# Patient Record
Sex: Female | Born: 1955 | Race: White | Hispanic: No | Marital: Married | Smoking: Current every day smoker
Health system: Southern US, Community
[De-identification: ages and names within clinical notes are randomized; demographics above are authoritative.]

## PROBLEM LIST (undated history)

## (undated) DIAGNOSIS — K449 Diaphragmatic hernia without obstruction or gangrene: Secondary | ICD-10-CM

## (undated) DIAGNOSIS — E079 Disorder of thyroid, unspecified: Secondary | ICD-10-CM

## (undated) DIAGNOSIS — F329 Major depressive disorder, single episode, unspecified: Secondary | ICD-10-CM

## (undated) DIAGNOSIS — F32A Depression, unspecified: Secondary | ICD-10-CM

## (undated) DIAGNOSIS — M1990 Unspecified osteoarthritis, unspecified site: Secondary | ICD-10-CM

## (undated) DIAGNOSIS — F419 Anxiety disorder, unspecified: Secondary | ICD-10-CM

## (undated) DIAGNOSIS — E785 Hyperlipidemia, unspecified: Secondary | ICD-10-CM

## (undated) DIAGNOSIS — E119 Type 2 diabetes mellitus without complications: Secondary | ICD-10-CM

## (undated) DIAGNOSIS — I10 Essential (primary) hypertension: Secondary | ICD-10-CM

## (undated) HISTORY — DX: Hyperlipidemia, unspecified: E78.5

## (undated) HISTORY — DX: Major depressive disorder, single episode, unspecified: F32.9

## (undated) HISTORY — DX: Anxiety disorder, unspecified: F41.9

## (undated) HISTORY — PX: KNEE ARTHROSCOPY W/ MENISCAL TRANSPLANT: SHX1878

## (undated) HISTORY — DX: Disorder of thyroid, unspecified: E07.9

## (undated) HISTORY — PX: CARPAL TUNNEL RELEASE: SHX101

## (undated) HISTORY — DX: Depression, unspecified: F32.A

## (undated) HISTORY — DX: Type 2 diabetes mellitus without complications: E11.9

---

## 1985-04-26 HISTORY — PX: TUBAL LIGATION: SHX77

## 1997-04-26 HISTORY — PX: HAND SURGERY: SHX662

## 2005-08-24 ENCOUNTER — Ambulatory Visit: Payer: Self-pay | Admitting: Family Medicine

## 2005-10-15 ENCOUNTER — Ambulatory Visit: Payer: Self-pay | Admitting: Gastroenterology

## 2006-08-16 ENCOUNTER — Ambulatory Visit: Payer: Self-pay | Admitting: Internal Medicine

## 2006-08-25 ENCOUNTER — Ambulatory Visit: Payer: Self-pay | Admitting: Internal Medicine

## 2006-09-25 ENCOUNTER — Ambulatory Visit: Payer: Self-pay | Admitting: Internal Medicine

## 2006-10-25 ENCOUNTER — Ambulatory Visit: Payer: Self-pay | Admitting: Internal Medicine

## 2007-04-17 ENCOUNTER — Ambulatory Visit: Payer: Self-pay | Admitting: Urology

## 2007-09-07 ENCOUNTER — Ambulatory Visit: Payer: Self-pay | Admitting: Urology

## 2008-02-01 ENCOUNTER — Ambulatory Visit: Payer: Self-pay | Admitting: Gastroenterology

## 2008-04-04 ENCOUNTER — Ambulatory Visit: Payer: Self-pay

## 2008-04-26 HISTORY — PX: KNEE SURGERY: SHX244

## 2008-06-06 ENCOUNTER — Ambulatory Visit: Payer: Self-pay | Admitting: General Practice

## 2008-06-07 ENCOUNTER — Ambulatory Visit: Payer: Self-pay | Admitting: General Practice

## 2008-07-12 ENCOUNTER — Ambulatory Visit: Payer: Self-pay | Admitting: Urology

## 2009-08-28 ENCOUNTER — Ambulatory Visit: Payer: Self-pay | Admitting: Urology

## 2011-04-13 ENCOUNTER — Ambulatory Visit: Payer: Self-pay | Admitting: Surgery

## 2011-06-16 ENCOUNTER — Ambulatory Visit: Payer: Self-pay | Admitting: Gastroenterology

## 2011-06-16 LAB — HM COLONOSCOPY

## 2011-06-19 LAB — PATHOLOGY REPORT

## 2011-09-10 ENCOUNTER — Ambulatory Visit: Payer: Self-pay | Admitting: Urology

## 2011-09-10 LAB — CREATININE, SERUM
Creatinine: 0.98 mg/dL (ref 0.60–1.30)
EGFR (African American): >60
EGFR (Non-African Amer.): >60

## 2012-03-31 LAB — HM PAP SMEAR: HM PAP: NEGATIVE

## 2014-05-10 LAB — LIPID PANEL
CHOLESTEROL: 160 mg/dL (ref 0–200)
HDL: 39 mg/dL (ref 35–70)
LDL CALC: 84 mg/dL
TRIGLYCERIDES: 183 mg/dL — AB (ref 40–160)

## 2014-05-10 LAB — BASIC METABOLIC PANEL
BUN: 21 mg/dL (ref 4–21)
CREATININE: 0.8 mg/dL (ref 0.5–1.1)
Glucose: 108 mg/dL
Potassium: 4.4 mmol/L (ref 3.4–5.3)
SODIUM: 142 mmol/L (ref 137–147)

## 2014-05-10 LAB — TSH: TSH: 2.29 u[IU]/mL (ref 0.41–5.90)

## 2014-05-10 LAB — CBC AND DIFFERENTIAL
HEMATOCRIT: 46 % (ref 36–46)
Hemoglobin: 15.8 g/dL (ref 12.0–16.0)
PLATELETS: 155 10*3/uL (ref 150–399)
WBC: 7.3 10^3/mL

## 2014-05-10 LAB — HM MAMMOGRAPHY

## 2014-05-10 LAB — HEPATIC FUNCTION PANEL
ALT: 16 U/L (ref 7–35)
AST: 19 U/L (ref 13–35)

## 2014-05-10 LAB — HEMOGLOBIN A1C: Hgb A1c MFr Bld: 6 % (ref 4.0–6.0)

## 2014-09-27 ENCOUNTER — Other Ambulatory Visit: Payer: Self-pay | Admitting: Family Medicine

## 2014-09-27 DIAGNOSIS — I10 Essential (primary) hypertension: Secondary | ICD-10-CM

## 2014-11-05 ENCOUNTER — Other Ambulatory Visit: Payer: Self-pay

## 2014-11-05 DIAGNOSIS — F329 Major depressive disorder, single episode, unspecified: Secondary | ICD-10-CM | POA: Insufficient documentation

## 2014-11-05 DIAGNOSIS — F32A Depression, unspecified: Secondary | ICD-10-CM

## 2014-11-05 DIAGNOSIS — E785 Hyperlipidemia, unspecified: Secondary | ICD-10-CM

## 2014-11-05 MED ORDER — SIMVASTATIN 10 MG PO TABS: 10.0000 mg | ORAL_TABLET | Freq: Every day | ORAL | Status: DC

## 2014-11-05 MED ORDER — VENLAFAXINE HCL ER 150 MG PO TB24: 1.0000 | ORAL_TABLET | Freq: Every day | ORAL | Status: DC

## 2015-01-13 ENCOUNTER — Other Ambulatory Visit: Payer: Self-pay

## 2015-01-13 DIAGNOSIS — F419 Anxiety disorder, unspecified: Secondary | ICD-10-CM | POA: Insufficient documentation

## 2015-01-13 DIAGNOSIS — I152 Hypertension secondary to endocrine disorders: Secondary | ICD-10-CM | POA: Insufficient documentation

## 2015-01-13 DIAGNOSIS — E039 Hypothyroidism, unspecified: Secondary | ICD-10-CM | POA: Insufficient documentation

## 2015-01-13 DIAGNOSIS — E1159 Type 2 diabetes mellitus with other circulatory complications: Secondary | ICD-10-CM | POA: Insufficient documentation

## 2015-01-13 DIAGNOSIS — I10 Essential (primary) hypertension: Secondary | ICD-10-CM

## 2015-01-13 MED ORDER — METOPROLOL SUCCINATE ER 25 MG PO TB24: 25.0000 mg | ORAL_TABLET | Freq: Every day | ORAL | Status: DC

## 2015-01-13 NOTE — Telephone Encounter (Signed)
Last OV 04/2014  Thanks,   -Laura  

## 2015-01-14 ENCOUNTER — Ambulatory Visit (INDEPENDENT_AMBULATORY_CARE_PROVIDER_SITE_OTHER): Payer: 59 | Admitting: Family Medicine

## 2015-01-14 ENCOUNTER — Encounter: Payer: Self-pay | Admitting: Family Medicine

## 2015-01-14 VITALS — BP 122/76 | HR 90 | Temp 101.6°F | Resp 16 | Wt 211.8 lb

## 2015-01-14 DIAGNOSIS — F32A Depression, unspecified: Secondary | ICD-10-CM | POA: Insufficient documentation

## 2015-01-14 DIAGNOSIS — E1169 Type 2 diabetes mellitus with other specified complication: Secondary | ICD-10-CM | POA: Insufficient documentation

## 2015-01-14 DIAGNOSIS — N83209 Unspecified ovarian cyst, unspecified side: Secondary | ICD-10-CM | POA: Insufficient documentation

## 2015-01-14 DIAGNOSIS — B349 Viral infection, unspecified: Secondary | ICD-10-CM | POA: Diagnosis not present

## 2015-01-14 DIAGNOSIS — K635 Polyp of colon: Secondary | ICD-10-CM | POA: Insufficient documentation

## 2015-01-14 DIAGNOSIS — E785 Hyperlipidemia, unspecified: Secondary | ICD-10-CM | POA: Insufficient documentation

## 2015-01-14 DIAGNOSIS — K449 Diaphragmatic hernia without obstruction or gangrene: Secondary | ICD-10-CM | POA: Insufficient documentation

## 2015-01-14 DIAGNOSIS — F329 Major depressive disorder, single episode, unspecified: Secondary | ICD-10-CM | POA: Insufficient documentation

## 2015-01-14 DIAGNOSIS — D4410 Neoplasm of uncertain behavior of unspecified adrenal gland: Secondary | ICD-10-CM | POA: Insufficient documentation

## 2015-01-14 LAB — POCT INFLUENZA A/B
Influenza A, POC: NEGATIVE
Influenza B, POC: NEGATIVE

## 2015-01-14 MED ORDER — OSELTAMIVIR PHOSPHATE 75 MG PO CAPS: 75.0000 mg | ORAL_CAPSULE | Freq: Two times a day (BID) | ORAL | Status: DC

## 2015-01-14 NOTE — Patient Instructions (Signed)
Continue fluid intake and add Gatorade. Resume eating if possible. May use Delsym for cough

## 2015-01-14 NOTE — Progress Notes (Signed)
Subjective:     Patient ID: Regina Pierce, female   DOB: Jun 18, 1955, 59 y.o.   MRN: [id number]  HPI  Chief Complaint  Patient presents with  . Fever    Patient comes in office today with flu like symptoms since saturday 9/17. Patient has complaints of faigue, body ache, chills, watery stools, cough, vomiting and a fever high of 99.8. Patient reports taking ots [place] HBP  States she felt fatigued and was sleeping a lot on 9/17-18.Then developed dry heaves and loose stools a couple of times a day. Now has developed cough in the absence of sore throat and sinus congestion. Has been drinking water but has not had much to eat. States she is exposed to college age adults.   Review of Systems     Objective:   Physical Exam  Constitutional: She appears well-developed and well-nourished. She has a sickly appearance.  Abdominal: Soft. There is no tenderness.  Increased bowel sounds  Ears: T.M's intact without inflammatio Throat: no tonsillar enlargement or exudate Neck: no cervical adenopathy Lungs: clear     Assessment:    1. Viral syndrome: will treat empirically for influenza - POCT Influenza A/B - oseltamivir (TAMIFLU) 75 MG capsule; Take 1 capsule (75 mg total) by mouth 2 (two) times daily.  Dispense: 10 capsule; Refill: 0    Plan:    Return in 2 days if not improving. Work excuse provided for 9/19-23.

## 2015-01-20 ENCOUNTER — Ambulatory Visit
Admission: RE | Admit: 2015-01-20 | Discharge: 2015-01-20 | Disposition: A | Discharge status: Home / Self Care | Payer: 59 | Source: Ambulatory Visit | Attending: Family Medicine | Admitting: Family Medicine

## 2015-01-20 ENCOUNTER — Ambulatory Visit (INDEPENDENT_AMBULATORY_CARE_PROVIDER_SITE_OTHER): Payer: 59 | Admitting: Family Medicine

## 2015-01-20 ENCOUNTER — Encounter: Payer: Self-pay | Admitting: Family Medicine

## 2015-01-20 VITALS — BP 132/68 | HR 64 | Temp 98.6°F | Resp 16 | Wt 210.0 lb

## 2015-01-20 DIAGNOSIS — R05 Cough: Secondary | ICD-10-CM

## 2015-01-20 DIAGNOSIS — R059 Cough, unspecified: Secondary | ICD-10-CM

## 2015-01-20 MED ORDER — LEVALBUTEROL HCL 1.25 MG/0.5ML IN NEBU: 1.2500 mg | INHALATION_SOLUTION | RESPIRATORY_TRACT | Status: AC

## 2015-01-20 NOTE — Progress Notes (Signed)
Subjective:    Patient ID: Regina Pierce, female    DOB: [birth year], 59 y.o.   MRN: [id number]  URI  The current episode started 1 to 4 weeks ago. The problem has been unchanged. The maximum temperature recorded prior to her arrival was 101 - 101.9 F. Associated symptoms include congestion, coughing (mostly dry), diarrhea, rhinorrhea and wheezing. Pertinent negatives include no abdominal pain, chest pain, dysuria, ear pain, headaches, joint pain, joint swelling, nausea, neck pain, plugged ear sensation, rash, sinus pain, sneezing, sore throat, swollen glands or vomiting. Treatments tried: abx, Tamiflu, prescription cough syrup. The treatment provided no relief.  Pt saw Mikki Santee last Tuesday, and was prescribed Tamiflu, without relief. Pt went to [facility] yesterday morning, and was prescribed Doxy and Guaifenesin-Codeine 100-10 mg/78mL.    Review of Systems  Constitutional: Positive for appetite change.  HENT: Positive for congestion and rhinorrhea. Negative for ear pain, sneezing and sore throat.   Respiratory: Positive for cough (mostly dry) and wheezing.   Cardiovascular: Negative for chest pain.  Gastrointestinal: Positive for diarrhea. Negative for nausea, vomiting and abdominal pain.  Genitourinary: Negative for dysuria.  Musculoskeletal: Negative for joint pain and neck pain.  Skin: Negative for rash.  Neurological: Negative for headaches.   BP 132/68 mmHg  Pulse 64  Temp(Src) 98.6 F (37 C) (Oral)  Resp 16  Wt 210 lb (95.255 kg)  SpO2 95%   Patient Active Problem List   Diagnosis Date Noted  . Colon polyp 01/14/2015  . Clinical depression 01/14/2015  . Diabetes mellitus, type 2 01/14/2015  . 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 Hypercholesteremia 01/14/2015  . 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 Cyst of ovary 01/14/2015  . Anxiety 01/13/2015  . BP (high blood pressure) 01/13/2015  . Adult hypothyroidism 01/13/2015  . Hyperlipemia  11/05/2014  . Depression 11/05/2014   Past Medical History  Diagnosis Date  . Hyperlipidemia   . Diabetes mellitus without complication   . Thyroid disease   . Depression   . Anxiety    Current Outpatient Prescriptions on File Prior to Visit  Medication Sig  . aspirin 81 MG tablet Take by mouth.  . levothyroxine (SYNTHROID, LEVOTHROID) 150 MCG tablet Take by mouth.  Marland Kitchen lisinopril-hydrochlorothiazide (PRINZIDE,ZESTORETIC) 20-25 MG per tablet TAKE ONE TABLET BY MOUTH EVERY DAY  . LORazepam (ATIVAN) 1 MG tablet Take by mouth.  . metoprolol succinate (TOPROL XL) 25 MG 24 hr tablet Take 1 tablet (25 mg total) by mouth daily.  . simvastatin (ZOCOR) 10 MG tablet Take 1 tablet (10 mg total) by mouth at bedtime.  . Venlafaxine HCl 150 MG TB24 Take 1 tablet (150 mg total) by mouth daily.   No current facility-administered medications on file prior to visit.   No Known Allergies Past Surgical History  Procedure Laterality Date  . Knee surgery  2010  . Tubal ligation  1987  . Hand surgery Bilateral 1999    carpal tunnel    Social History   Social History  . Marital Status: Married    Spouse Name: N/A  . Number of Children: N/A  . Years of Education: N/A   Occupational History  . Not on file.   Social History Main Topics  . Smoking status: Current Some Day Smoker    Types: Cigarettes  . Smokeless tobacco: Never Used  . Alcohol Use: No  . Drug Use: No  . Sexual Activity: Not on file   Other Topics Concern  . Not  on file   Social History Narrative   Family History  Problem Relation Age of Onset  . Hypertension Sister       Objective:   Physical Exam  Constitutional: She is oriented to person, place, and time. She appears well-developed and well-nourished.  HENT:  Head: Normocephalic and atraumatic.  Right Ear: External ear normal.  Left Ear: External ear normal.  Mouth/Throat: Oropharynx is clear and moist.  Eyes: Conjunctivae and EOM are normal. Pupils are equal,  round, and reactive to light.  Neck: Normal range of motion. Neck supple.  Cardiovascular: Normal rate and regular rhythm.   Pulmonary/Chest: Effort normal. She has wheezes.  Neurological: She is alert and oriented to person, place, and time.  Psychiatric: She has a normal mood and affect. Her behavior is normal. Judgment and thought content normal.   BP 132/68 mmHg  Pulse 64  Temp(Src) 98.6 F (37 C) (Oral)  Resp 16  Wt 210 lb (95.255 kg)  SpO2 95%     Assessment & Plan:  1. Cough Not improved as much as patient had anticipated. Has been seen twice for same illness.   Has been on antibiotic for one day only.  Fever better, but very concerned about pneumonia Will check CXR. Continue current antibiotic. May consider respiratory quinolone if not improving. Also, recommend continue not smoking. Has not really quit but just feels too bad.   Patient instructed to call back if condition worsens or does not improve.   Did not improve with nebulizer at hoped.  Will hold off on inhaler or antibiotic for now.  Unclear if had flu or this is first presentation of COPD.  - DG Chest 2 View - levalbuterol (XOPENEX) nebulizer solution 1.25 mg; Take 1.25 mg by nebulization now.

## 2015-01-21 ENCOUNTER — Telehealth: Payer: Self-pay

## 2015-01-21 NOTE — Telephone Encounter (Signed)
-----   Message from Margarita Rana, MD sent at 01/20/2015  4:34 PM EDT ----- No pneumonia.  Did remark that there are bilateral spot that are probable nipple shadows. Recommend recheck CXR in 2 weeks and call if not improved. Thanks.

## 2015-01-21 NOTE — Telephone Encounter (Signed)
Pt advised as directed below.   Thanks,   -Laura  

## 2015-01-23 ENCOUNTER — Telehealth: Payer: Self-pay | Admitting: Family Medicine

## 2015-01-23 NOTE — Telephone Encounter (Signed)
Pt called wanting to talk to Mickel Baas about the cough she has.  She said she was diagnosed with pneumonia last week.  Please call back at   [phone]  Thanks Con Memos

## 2015-01-24 NOTE — Telephone Encounter (Signed)
I spoke with Ms. Dieujuste she reports feeling some better but still very tired and coughing some.  She is not sure if that is normal, I advised her to try OTC cough syrup to see if that helps with her fatigued.  I also told her to call tomorrow if she needs to be seen Saturday.   Thanks,   -Mickel Baas

## 2015-03-31 ENCOUNTER — Other Ambulatory Visit: Payer: Self-pay | Admitting: Family Medicine

## 2015-03-31 DIAGNOSIS — E039 Hypothyroidism, unspecified: Secondary | ICD-10-CM

## 2015-03-31 NOTE — Telephone Encounter (Signed)
Last TSH 05/10/2014 and was 2.29.

## 2015-05-13 ENCOUNTER — Ambulatory Visit (INDEPENDENT_AMBULATORY_CARE_PROVIDER_SITE_OTHER): Payer: BLUE CROSS/BLUE SHIELD | Admitting: Family Medicine

## 2015-05-13 ENCOUNTER — Encounter: Payer: Self-pay | Admitting: Family Medicine

## 2015-05-13 VITALS — BP 110/66 | HR 80 | Temp 97.6°F | Resp 16 | Ht 64.0 in | Wt 216.0 lb

## 2015-05-13 DIAGNOSIS — E039 Hypothyroidism, unspecified: Secondary | ICD-10-CM | POA: Diagnosis not present

## 2015-05-13 DIAGNOSIS — Z0000 Encounter for general adult medical examination without abnormal findings: Secondary | ICD-10-CM | POA: Diagnosis not present

## 2015-05-13 DIAGNOSIS — K629 Disease of anus and rectum, unspecified: Secondary | ICD-10-CM

## 2015-05-13 DIAGNOSIS — E119 Type 2 diabetes mellitus without complications: Secondary | ICD-10-CM | POA: Diagnosis not present

## 2015-05-13 DIAGNOSIS — E1122 Type 2 diabetes mellitus with diabetic chronic kidney disease: Secondary | ICD-10-CM | POA: Insufficient documentation

## 2015-05-13 DIAGNOSIS — Z1239 Encounter for other screening for malignant neoplasm of breast: Secondary | ICD-10-CM

## 2015-05-13 DIAGNOSIS — Z720 Tobacco use: Secondary | ICD-10-CM | POA: Insufficient documentation

## 2015-05-13 DIAGNOSIS — Z23 Encounter for immunization: Secondary | ICD-10-CM

## 2015-05-13 DIAGNOSIS — N6452 Nipple discharge: Secondary | ICD-10-CM | POA: Diagnosis not present

## 2015-05-13 DIAGNOSIS — E7800 Pure hypercholesterolemia, unspecified: Secondary | ICD-10-CM | POA: Diagnosis not present

## 2015-05-13 DIAGNOSIS — Z716 Tobacco abuse counseling: Secondary | ICD-10-CM

## 2015-05-13 DIAGNOSIS — I10 Essential (primary) hypertension: Secondary | ICD-10-CM

## 2015-05-13 DIAGNOSIS — R319 Hematuria, unspecified: Secondary | ICD-10-CM

## 2015-05-13 DIAGNOSIS — N182 Chronic kidney disease, stage 2 (mild): Secondary | ICD-10-CM | POA: Insufficient documentation

## 2015-05-13 LAB — POCT URINALYSIS DIPSTICK
Bilirubin, UA: NEGATIVE
Glucose, UA: NEGATIVE
Ketones, UA: NEGATIVE
LEUKOCYTES UA: NEGATIVE
Nitrite, UA: NEGATIVE
SPEC GRAV UA: 1.01
UROBILINOGEN UA: 0.2
pH, UA: 6.5

## 2015-05-13 NOTE — Patient Instructions (Signed)
Please use over the counter Debrox for a week.

## 2015-05-13 NOTE — Progress Notes (Signed)
Patient ID: Regina Pierce, female   DOB: Dec 12, 1955, 60 y.o.   MRN: [id number]       Patient: Regina Pierce, Female    DOB: Apr 14, 1956, 60 y.o.   MRN: [id number] Visit Date: 05/13/2015  Today's Provider: Margarita Rana, MD   Chief Complaint  Patient presents with  . Annual Exam   Subjective:    Annual physical exam Regina Pierce is a 60 y.o. female who presents today for health maintenance and complete physical. She feels well. She reports exercising daily (walking for 20 minutes). She reports she is sleeping well. 05/10/14 CPE 05/10/14 Mammo-BI-RADS 1 04/10/12 Pap-normal 06/16/11 Colon-polyps, recheck in 5 yrs  Lab Results  Component Value Date   WBC 7.3 05/10/2014   HGB 15.8 05/10/2014   HCT 46 05/10/2014   PLT 155 05/10/2014   CHOL 160 05/10/2014   TRIG 183* 05/10/2014   HDL 39 05/10/2014   LDLCALC 84 05/10/2014   ALT 16 05/10/2014   AST 19 05/10/2014   NA 142 05/10/2014   K 4.4 05/10/2014   CREATININE 0.8 05/10/2014   BUN 21 05/10/2014   TSH 2.29 05/10/2014   HGBA1C 6.0 05/10/2014   -----------------------------------------------------------------   Review of Systems  Constitutional: Negative.   HENT: Negative.   Eyes: Negative.   Respiratory: Negative.   Cardiovascular: Negative.   Gastrointestinal: Negative.   Endocrine: Negative.   Genitourinary: Negative.   Musculoskeletal: Negative.   Skin: Negative.   Allergic/Immunologic: Negative.   Neurological: Negative.   Hematological: Negative.   Psychiatric/Behavioral: Negative.     Social History      She  reports that she has been smoking Cigarettes.  She has never used smokeless tobacco. She reports that she does not drink alcohol or use illicit drugs.       Social History   Social History  . Marital Status: Married    Spouse Name: N/A  . Number of Children: N/A  . Years of Education: N/A   Social History Main Topics  . Smoking status: Current Some Day Smoker    Types: Cigarettes  . Smokeless  tobacco: Never Used  . Alcohol Use: No  . Drug Use: No  . Sexual Activity: Not Asked   Other Topics Concern  . None   Social History Narrative    Past Medical History  Diagnosis Date  . Hyperlipidemia   . Diabetes mellitus without complication ([place])   . Thyroid disease   . Depression   . Anxiety      Patient Active Problem List   Diagnosis Date Noted  . Diabetes mellitus without complication ([place]) [id number]  . Tobacco abuse 05/13/2015  . Colon polyp 01/14/2015  . Clinical depression 01/14/2015  . 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 Hypercholesteremia 01/14/2015  . 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 Cyst of ovary 01/14/2015  . Diaphragmatic hernia 01/14/2015  . Anxiety 01/13/2015  . BP (high blood pressure) 01/13/2015  . Adult hypothyroidism 01/13/2015  . Hyperlipemia 11/05/2014  . Depression 11/05/2014    Past Surgical History  Procedure Laterality Date  . Knee surgery  2010  . Tubal ligation  1987  . Hand surgery Bilateral 1999    carpal tunnel     Family History        Family Status  Relation Status Death Age  . Mother Deceased   . Father Deceased   . Sister Deceased 6    heart defect  . Brother Alive   . Sister Deceased  [employer] accident  . Sister Alive         Her family history includes Hypertension in her sister.    No Known Allergies  Previous Medications   ASPIRIN 81 MG TABLET    Take by mouth.   LEVOTHYROXINE (SYNTHROID, LEVOTHROID) 150 MCG TABLET    TAKE 1 TABLET BY MOUTH ONCE A DAY   LISINOPRIL-HYDROCHLOROTHIAZIDE (PRINZIDE,ZESTORETIC) 20-25 MG PER TABLET    TAKE ONE TABLET BY MOUTH EVERY DAY   LORAZEPAM (ATIVAN) 1 MG TABLET    Take 1 mg by mouth as needed.    METOPROLOL SUCCINATE (TOPROL XL) 25 MG 24 HR TABLET    Take 1 tablet (25 mg total) by mouth daily.   MULTIPLE VITAMIN (MULTI-VITAMINS) TABS    Take 1 tablet by mouth daily.   SIMVASTATIN (ZOCOR) 10 MG TABLET    Take 1 tablet (10 mg total) by mouth at  bedtime.   VENLAFAXINE HCL 150 MG TB24    Take 1 tablet (150 mg total) by mouth daily.   VITAMIN D, CHOLECALCIFEROL, PO    Take 1 tablet by mouth daily.    Patient Care Team: Margarita Rana, MD as PCP - General (Family Medicine)     Objective:   Vitals: BP 110/66 mmHg  Pulse 80  Temp(Src) 97.6 F (36.4 C) (Oral)  Resp 16  Ht 5\' 4"  (1.626 m)  Wt 216 lb (97.977 kg)  BMI 37.06 kg/m2   Physical Exam  Constitutional: She is oriented to person, place, and time. She appears well-developed and well-nourished.  HENT:  Head: Normocephalic and atraumatic.  Right Ear: Tympanic membrane, external ear and ear canal normal.  Left Ear: Tympanic membrane, external ear and ear canal normal.  Nose: Nose normal.  Mouth/Throat: Uvula is midline, oropharynx is clear and moist and mucous membranes are normal.  Eyes: Conjunctivae, EOM and lids are normal. Pupils are equal, round, and reactive to light.  Neck: Trachea normal and normal range of motion. Neck supple. Carotid bruit is not present. No thyroid mass and no thyromegaly present.  Cardiovascular: Normal rate, regular rhythm and normal heart sounds.   Pulmonary/Chest: Effort normal and breath sounds normal. Right breast exhibits skin change.  Crusting on the nipple  Abdominal: Soft. Normal appearance and bowel sounds are normal. There is no hepatosplenomegaly. There is no tenderness.  Genitourinary: Vagina normal. Rectal exam shows external hemorrhoid. No breast swelling, tenderness or discharge.  Musculoskeletal: Normal range of motion.  Lymphadenopathy:    She has no cervical adenopathy.    She has no axillary adenopathy.  Neurological: She is alert and oriented to person, place, and time. She has normal strength. No cranial nerve deficit.  Skin: Skin is warm, dry and intact.  Psychiatric: She has a normal mood and affect. Her speech is normal and behavior is normal. Judgment and thought content normal. Cognition and memory are normal.      Depression Screen PHQ 2/9 Scores 05/13/2015  Exception Documentation Patient refusal      Assessment & Plan:     Routine Health Maintenance and Physical Exam  Exercise Activities and Dietary recommendations Goals    . Exercise 150 minutes per week (moderate activity)       Immunization History  Administered Date(s) Administered  . Influenza,inj,Quad PF,36+ Mos 05/13/2015  . Tdap 12/07/2007    1. Annual physical exam As above.  - POCT urinalysis dipstick  2. Need for influenza vaccination Given today.  - Flu Vaccine QUAD 36+ mos IM  3. Essential  hypertension Condition is stable. Please continue current medication and  plan of care as noted.   - CBC with Differential/Platelet - Comprehensive metabolic panel  4. Hypothyroidism, unspecified hypothyroidism type Check labs.  - TSH  5. Diabetes mellitus without complication ([place]) Check labs.  - Hemoglobin A1c  6. Hypercholesteremia Stable. Check labs.  - Lipid Panel With LDL/HDL Ratio  7. Breast cancer screening Will schedule.  - MM Digital Diagnostic Bilat; Future  8. Tobacco abuse Counseled today. Will look into Nicotine patches and support thru her insurance.   9. Tobacco abuse counseling Counseled today.   10. Hematuria Will send today.  - Urine Microscopic  11. Nipple discharge Will schedule mammogram.   - Prolactin  12. Rectal lesion Will send for HPV.   - Cytology - Non PAP;  Patient was seen and examined by Jerrell Belfast, MD, and note scribed by Lynford Humphrey, [place].   I have reviewed the document for accuracy and completeness and I agree with above. Jerrell Belfast, MD   Margarita Rana, MD     --------------------------------------------------------------------

## 2015-05-14 ENCOUNTER — Telehealth: Payer: Self-pay

## 2015-05-14 LAB — COMPREHENSIVE METABOLIC PANEL
ALT: 22 IU/L (ref 0–32)
AST: 20 IU/L (ref 0–40)
Albumin/Globulin Ratio: 1.5 (ref 1.1–2.5)
Albumin: 4.1 g/dL (ref 3.5–5.5)
Alkaline Phosphatase: 67 IU/L (ref 39–117)
BUN/Creatinine Ratio: 24 — ABNORMAL HIGH (ref 9–23)
BUN: 16 mg/dL (ref 6–24)
Bilirubin Total: 0.7 mg/dL (ref 0.0–1.2)
CALCIUM: 9.5 mg/dL (ref 8.7–10.2)
CO2: 27 mmol/L (ref 18–29)
CREATININE: 0.67 mg/dL (ref 0.57–1.00)
Chloride: 99 mmol/L (ref 96–106)
GFR calc Af Amer: 113 mL/min/{1.73_m2} (ref >59)
GFR, EST NON AFRICAN AMERICAN: 98 mL/min/{1.73_m2} (ref >59)
GLOBULIN, TOTAL: 2.8 g/dL (ref 1.5–4.5)
Glucose: 115 mg/dL — ABNORMAL HIGH (ref 65–99)
Potassium: 4.1 mmol/L (ref 3.5–5.2)
SODIUM: 140 mmol/L (ref 134–144)
Total Protein: 6.9 g/dL (ref 6.0–8.5)

## 2015-05-14 LAB — LIPID PANEL WITH LDL/HDL RATIO
CHOLESTEROL TOTAL: 159 mg/dL (ref 100–199)
HDL: 35 mg/dL — AB (ref >39)
LDL Calculated: 89 mg/dL (ref 0–99)
LDl/HDL Ratio: 2.5 ratio units (ref 0.0–3.2)
Triglycerides: 175 mg/dL — ABNORMAL HIGH (ref 0–149)
VLDL CHOLESTEROL CAL: 35 mg/dL (ref 5–40)

## 2015-05-14 LAB — CBC WITH DIFFERENTIAL/PLATELET
Basophils Absolute: 0.1 10*3/uL (ref 0.0–0.2)
Basos: 1 %
EOS (ABSOLUTE): 0.2 10*3/uL (ref 0.0–0.4)
Eos: 2 %
Hematocrit: 43.3 % (ref 34.0–46.6)
Hemoglobin: 15.1 g/dL (ref 11.1–15.9)
Immature Grans (Abs): 0 10*3/uL (ref 0.0–0.1)
Immature Granulocytes: 0 %
Lymphocytes Absolute: 1.9 10*3/uL (ref 0.7–3.1)
Lymphs: 27 %
MCH: 31.1 pg (ref 26.6–33.0)
MCHC: 34.9 g/dL (ref 31.5–35.7)
MCV: 89 fL (ref 79–97)
Monocytes Absolute: 0.3 10*3/uL (ref 0.1–0.9)
Monocytes: 5 %
Neutrophils Absolute: 4.6 10*3/uL (ref 1.4–7.0)
Neutrophils: 65 %
Platelets: 160 10*3/uL (ref 150–379)
RBC: 4.86 x10E6/uL (ref 3.77–5.28)
RDW: 12.8 % (ref 12.3–15.4)
WBC: 7 10*3/uL (ref 3.4–10.8)

## 2015-05-14 LAB — PROLACTIN: PROLACTIN: 6.2 ng/mL (ref 4.8–23.3)

## 2015-05-14 LAB — TSH: TSH: 2.6 u[IU]/mL (ref 0.450–4.500)

## 2015-05-14 LAB — HEMOGLOBIN A1C
ESTIMATED AVERAGE GLUCOSE: 131 mg/dL
Hgb A1c MFr Bld: 6.2 % — ABNORMAL HIGH (ref 4.8–5.6)

## 2015-05-14 LAB — URINALYSIS, MICROSCOPIC ONLY: Casts: NONE SEEN /lpf

## 2015-05-14 NOTE — Telephone Encounter (Signed)
Left message to call back  

## 2015-05-14 NOTE — Telephone Encounter (Signed)
-----   Message from Margarita Rana, MD sent at 05/14/2015  9:59 AM EST ----- Labs stable except for sugar higher than previous. Eat healthy and exercise and recheck ov to recheck A1c in 4 months. Thanks.

## 2015-05-15 LAB — CYTOLOGY - NON PAP

## 2015-05-15 NOTE — Telephone Encounter (Signed)
Advised patient of results. Patient reports that she will call back to schedule appt.

## 2015-05-16 ENCOUNTER — Telehealth: Payer: Self-pay

## 2015-05-16 NOTE — Telephone Encounter (Signed)
-----   Message from Margarita Rana, MD sent at 05/16/2015 11:24 AM EST ----- No cancer cells. Please notify patient and have her to make sure and remind [place] next year to look at it again for stability.  Thanks.

## 2015-05-16 NOTE — Telephone Encounter (Signed)
LMTCB 05/16/2015  Thanks,   -Mickel Baas

## 2015-05-19 NOTE — Telephone Encounter (Signed)
Pt advised.   Thanks,   -Takeyah Wieman  

## 2015-08-01 ENCOUNTER — Other Ambulatory Visit: Payer: Self-pay | Admitting: Family Medicine

## 2015-08-01 DIAGNOSIS — I10 Essential (primary) hypertension: Secondary | ICD-10-CM

## 2015-09-19 ENCOUNTER — Other Ambulatory Visit: Payer: Self-pay | Admitting: Family Medicine

## 2015-09-19 DIAGNOSIS — E039 Hypothyroidism, unspecified: Secondary | ICD-10-CM

## 2015-09-19 DIAGNOSIS — E785 Hyperlipidemia, unspecified: Secondary | ICD-10-CM

## 2015-09-19 DIAGNOSIS — I10 Essential (primary) hypertension: Secondary | ICD-10-CM

## 2015-09-19 MED ORDER — LEVOTHYROXINE SODIUM 150 MCG PO TABS: 150.0000 ug | ORAL_TABLET | Freq: Every day | ORAL | Status: DC

## 2015-09-19 MED ORDER — METOPROLOL SUCCINATE ER 25 MG PO TB24: 25.0000 mg | ORAL_TABLET | Freq: Every day | ORAL | Status: DC

## 2015-09-19 MED ORDER — LISINOPRIL-HYDROCHLOROTHIAZIDE 20-25 MG PO TABS: 1.0000 | ORAL_TABLET | Freq: Every day | ORAL | Status: DC

## 2015-09-19 MED ORDER — SIMVASTATIN 10 MG PO TABS
10.0000 mg | ORAL_TABLET | Freq: Every day | ORAL | Status: DC
Start: 2015-09-19 — End: 2016-03-25

## 2015-09-19 NOTE — Telephone Encounter (Signed)
Pt is also requesting refill on simvastatin (ZOCOR) 10 MG tablet. Sent into [facility].90 day supply

## 2015-09-19 NOTE — Telephone Encounter (Signed)
Pt is requesting refills on levothyroxine (SYNTHROID, LEVOTHROID) 150 MCG,lisinopril-hydrochlorothiazide (PRINZIDE,ZESTORETIC) 20-25 MG tablet ,metoprolol succinate (TOPROL-XL) 25 MG 24 hr tablet .metoprolol succinate (TOPROL-XL) 25 MG 24 hr tablet. 90 day supply called into [facility]

## 2015-09-24 ENCOUNTER — Other Ambulatory Visit: Payer: Self-pay | Admitting: Family Medicine

## 2015-09-24 DIAGNOSIS — F419 Anxiety disorder, unspecified: Secondary | ICD-10-CM

## 2016-02-25 ENCOUNTER — Telehealth: Payer: Self-pay

## 2016-02-25 DIAGNOSIS — N3001 Acute cystitis with hematuria: Secondary | ICD-10-CM

## 2016-02-25 MED ORDER — SULFAMETHOXAZOLE-TRIMETHOPRIM 800-160 MG PO TABS: 1.0000 | ORAL_TABLET | Freq: Two times a day (BID) | ORAL | 0 refills | Status: DC

## 2016-02-25 NOTE — Telephone Encounter (Signed)
Left message for patient stating below.

## 2016-02-25 NOTE — Telephone Encounter (Signed)
Normally dont treat without appt, especially with no recurrent history. However, I will make an exception this time. Bactrim sent to [place]. If symptoms persist or worsen she needs to be seen. May also add AZO tabs or cranberry juice. Push fluids.

## 2016-02-25 NOTE — Telephone Encounter (Signed)
Patient called saying that she has had UTI symptoms since Monday (2 days ago). She reports that she is having urinary frequency, burning on urination, lower back pain, and she has some blood on the tissue after wiping. She reports that she has had a UTI in the past, but it has been "a while". Patient was requesting that her husband drop off a urine specimen, but I advised her that per our policy she would need an OV. Patient reports that she works long hours, and she is unable to get off of work. She is requesting that something be sent into the pharmacy for her symptoms. Patient uses [facility]. She was formerly a Dr. Venia Minks patient. Contact number is [phone]. Can leave a message at her home number as well. Thanks!

## 2016-03-25 ENCOUNTER — Other Ambulatory Visit: Payer: Self-pay | Admitting: Family Medicine

## 2016-03-25 DIAGNOSIS — E785 Hyperlipidemia, unspecified: Secondary | ICD-10-CM

## 2016-03-25 DIAGNOSIS — F419 Anxiety disorder, unspecified: Secondary | ICD-10-CM

## 2016-03-25 DIAGNOSIS — E039 Hypothyroidism, unspecified: Secondary | ICD-10-CM

## 2016-03-25 DIAGNOSIS — I10 Essential (primary) hypertension: Secondary | ICD-10-CM

## 2016-03-26 ENCOUNTER — Other Ambulatory Visit: Payer: Self-pay

## 2016-03-26 MED ORDER — LORAZEPAM 1 MG PO TABS: 1.0000 mg | ORAL_TABLET | ORAL | 1 refills | Status: DC | PRN

## 2016-03-26 NOTE — Telephone Encounter (Signed)
RX called in-aa 

## 2016-03-26 NOTE — Telephone Encounter (Signed)
Pharmacy requesting refills. Thanks!  

## 2016-03-26 NOTE — Telephone Encounter (Signed)
Please review-aa 

## 2016-05-03 ENCOUNTER — Encounter: Payer: Self-pay | Admitting: *Deleted

## 2016-05-04 ENCOUNTER — Encounter: Payer: Self-pay | Admitting: Student in an Organized Health Care Education/Training Program

## 2016-05-07 ENCOUNTER — Encounter
Admission: RE | Admit: 2016-05-07 | Discharge: 2016-05-07 | Disposition: A | Discharge status: Home / Self Care | Payer: 59 | Source: Ambulatory Visit | Attending: Podiatry | Admitting: Podiatry

## 2016-05-07 NOTE — Patient Instructions (Signed)
  Your procedure is scheduled on: 05-12-16 [facility]) Report to Same Day Surgery 2nd floor medical mall [facility] Entrance-take elevator on left to 2nd floor.  Check in with surgery information desk.) To find out your arrival time please call [phone] between 1PM - 3PM on 05-11-16 (TUESDAY)  Remember: Instructions that are not followed completely may result in serious medical risk, up to and including death, or upon the discretion of your surgeon and anesthesiologist your surgery may need to be rescheduled.    _x___ 1. Do not eat food or drink liquids after midnight. No gum chewing or hard candies.     __x__ 2. No Alcohol for 24 hours before or after surgery.   __x__3. No Smoking for 24 prior to surgery.   ____  4. Bring all medications with you on the day of surgery if instructed.    __x__ 5. Notify your doctor if there is any change in your medical condition     (cold, fever, infections).     Do not wear jewelry, make-up, hairpins, clips or nail polish.  Do not wear lotions, powders, or perfumes. You may wear deodorant.  Do not shave 48 hours prior to surgery. Men may shave face and neck.  Do not bring valuables to the hospital.    [facility] is not responsible for any belongings or valuables.               Contacts, dentures or bridgework may not be worn into surgery.  Leave your suitcase in the car. After surgery it may be brought to your room.  For patients admitted to the hospital, discharge time is determined by your treatment team.   Patients discharged the day of surgery will not be allowed to drive home.  You will need someone to drive you home and stay with you the night of your procedure.    Please read over the following fact sheets that you were given:   [facility] Preparing for Surgery and or MRSA Information   _x___ Take these medicines the morning of surgery with A SIP OF WATER:    1. METOPROLOL  2. LEVOTHYROXINE  3. EFFEXOR (VENLAFAXINE)  4. MAY TAKE  LORAZEPAM IF NEEDED  5.  6.  ____Fleets enema or Magnesium Citrate as directed.   _x___ Use CHG Soap or sage wipes as directed on instruction sheet   ____ Use inhalers on the day of surgery and bring to hospital day of surgery  ____ Stop metformin 2 days prior to surgery    ____ Take 1/2 of usual insulin dose the night before surgery and none on the morning of  surgery.   _X___ Stop Aspirin, Coumadin, Pllavix ,Eliquis, Effient, or Pradaxa-PT STOPPED ASPIRIN ON 05-06-16  x__ Stop Anti-inflammatories such as Advil, Aleve, Ibuprofen, Motrin, Naproxen,          Naprosyn, Goodies powders or aspirin products NOW- Ok to take Tylenol.   ____ Stop supplements until after surgery.    ____ Bring C-Pap to the hospital.

## 2016-05-10 ENCOUNTER — Encounter
Admission: RE | Admit: 2016-05-10 | Discharge: 2016-05-10 | Disposition: A | Discharge status: Home / Self Care | Payer: BLUE CROSS/BLUE SHIELD | Source: Ambulatory Visit | Attending: Podiatry | Admitting: Podiatry

## 2016-05-10 DIAGNOSIS — F329 Major depressive disorder, single episode, unspecified: Secondary | ICD-10-CM | POA: Diagnosis not present

## 2016-05-10 DIAGNOSIS — E669 Obesity, unspecified: Secondary | ICD-10-CM | POA: Diagnosis not present

## 2016-05-10 DIAGNOSIS — E039 Hypothyroidism, unspecified: Secondary | ICD-10-CM | POA: Diagnosis not present

## 2016-05-10 DIAGNOSIS — M7662 Achilles tendinitis, left leg: Secondary | ICD-10-CM | POA: Diagnosis not present

## 2016-05-10 DIAGNOSIS — K449 Diaphragmatic hernia without obstruction or gangrene: Secondary | ICD-10-CM | POA: Diagnosis not present

## 2016-05-10 DIAGNOSIS — E119 Type 2 diabetes mellitus without complications: Secondary | ICD-10-CM | POA: Diagnosis not present

## 2016-05-10 DIAGNOSIS — M898X7 Other specified disorders of bone, ankle and foot: Secondary | ICD-10-CM | POA: Diagnosis not present

## 2016-05-10 DIAGNOSIS — I10 Essential (primary) hypertension: Secondary | ICD-10-CM | POA: Diagnosis not present

## 2016-05-10 DIAGNOSIS — Z6837 Body mass index (BMI) 37.0-37.9, adult: Secondary | ICD-10-CM | POA: Diagnosis not present

## 2016-05-10 DIAGNOSIS — I452 Bifascicular block: Secondary | ICD-10-CM | POA: Diagnosis not present

## 2016-05-10 DIAGNOSIS — F419 Anxiety disorder, unspecified: Secondary | ICD-10-CM | POA: Diagnosis not present

## 2016-05-10 DIAGNOSIS — Z7982 Long term (current) use of aspirin: Secondary | ICD-10-CM | POA: Diagnosis not present

## 2016-05-10 DIAGNOSIS — F17200 Nicotine dependence, unspecified, uncomplicated: Secondary | ICD-10-CM | POA: Diagnosis not present

## 2016-05-10 LAB — BASIC METABOLIC PANEL
ANION GAP: 9 (ref 5–15)
BUN: 16 mg/dL (ref 6–20)
CALCIUM: 9.2 mg/dL (ref 8.9–10.3)
CO2: 26 mmol/L (ref 22–32)
Chloride: 103 mmol/L (ref 101–111)
Creatinine, Ser: 0.73 mg/dL (ref 0.44–1.00)
GLUCOSE: 134 mg/dL — AB (ref 65–99)
POTASSIUM: 3.8 mmol/L (ref 3.5–5.1)
Sodium: 138 mmol/L (ref 135–145)

## 2016-05-12 ENCOUNTER — Encounter
Admission: RE | Disposition: A | Discharge status: Home / Self Care | Payer: Self-pay | Source: Ambulatory Visit | Attending: Podiatry

## 2016-05-12 ENCOUNTER — Ambulatory Visit: Payer: BLUE CROSS/BLUE SHIELD | Admitting: Anesthesiology

## 2016-05-12 ENCOUNTER — Ambulatory Visit
Admission: RE | Admit: 2016-05-12 | Discharge: 2016-05-12 | Disposition: A | Discharge status: Home / Self Care | Payer: BLUE CROSS/BLUE SHIELD | Source: Ambulatory Visit | Attending: Podiatry | Admitting: Podiatry

## 2016-05-12 ENCOUNTER — Encounter: Payer: Self-pay | Admitting: Anesthesiology

## 2016-05-12 ENCOUNTER — Ambulatory Visit: Admit: 2016-05-12 | Payer: 59 | Admitting: Podiatry

## 2016-05-12 DIAGNOSIS — Z6837 Body mass index (BMI) 37.0-37.9, adult: Secondary | ICD-10-CM | POA: Insufficient documentation

## 2016-05-12 DIAGNOSIS — M898X7 Other specified disorders of bone, ankle and foot: Secondary | ICD-10-CM | POA: Diagnosis not present

## 2016-05-12 DIAGNOSIS — M7662 Achilles tendinitis, left leg: Secondary | ICD-10-CM | POA: Insufficient documentation

## 2016-05-12 DIAGNOSIS — F419 Anxiety disorder, unspecified: Secondary | ICD-10-CM | POA: Insufficient documentation

## 2016-05-12 DIAGNOSIS — E669 Obesity, unspecified: Secondary | ICD-10-CM | POA: Insufficient documentation

## 2016-05-12 DIAGNOSIS — F17200 Nicotine dependence, unspecified, uncomplicated: Secondary | ICD-10-CM | POA: Insufficient documentation

## 2016-05-12 DIAGNOSIS — I10 Essential (primary) hypertension: Secondary | ICD-10-CM | POA: Insufficient documentation

## 2016-05-12 DIAGNOSIS — Z7982 Long term (current) use of aspirin: Secondary | ICD-10-CM | POA: Insufficient documentation

## 2016-05-12 DIAGNOSIS — K449 Diaphragmatic hernia without obstruction or gangrene: Secondary | ICD-10-CM | POA: Insufficient documentation

## 2016-05-12 DIAGNOSIS — E119 Type 2 diabetes mellitus without complications: Secondary | ICD-10-CM | POA: Insufficient documentation

## 2016-05-12 DIAGNOSIS — F329 Major depressive disorder, single episode, unspecified: Secondary | ICD-10-CM | POA: Insufficient documentation

## 2016-05-12 DIAGNOSIS — E039 Hypothyroidism, unspecified: Secondary | ICD-10-CM | POA: Insufficient documentation

## 2016-05-12 DIAGNOSIS — I452 Bifascicular block: Secondary | ICD-10-CM | POA: Insufficient documentation

## 2016-05-12 HISTORY — PX: ACHILLES TENDON SURGERY: SHX542

## 2016-05-12 HISTORY — PX: BONE EXOSTOSIS EXCISION: SHX1249

## 2016-05-12 HISTORY — DX: Essential (primary) hypertension: I10

## 2016-05-12 LAB — GLUCOSE, CAPILLARY: Glucose-Capillary: 124 mg/dL — ABNORMAL HIGH (ref 65–99)

## 2016-05-12 SURGERY — EXCISION, EXOSTOSIS
Anesthesia: General | Laterality: Left

## 2016-05-12 SURGERY — REPAIR, TENDON, ACHILLES
Anesthesia: General | Site: Foot | Laterality: Left | Wound class: Clean

## 2016-05-12 MED ORDER — ONDANSETRON HCL 4 MG/2ML IJ SOLN: 4.0000 mg | Freq: Four times a day (QID) | INTRAMUSCULAR | Status: DC | PRN

## 2016-05-12 MED ORDER — BUPIVACAINE HCL (PF) 0.25 % IJ SOLN
INTRAMUSCULAR | Status: DC | PRN
  Administered 2016-05-12: 10 mL

## 2016-05-12 MED ORDER — ONDANSETRON HCL 4 MG/2ML IJ SOLN
INTRAMUSCULAR | Status: AC
  Filled 2016-05-12: qty 2

## 2016-05-12 MED ORDER — ONDANSETRON HCL 4 MG/2ML IJ SOLN: 4.0000 mg | Freq: Once | INTRAMUSCULAR | Status: DC | PRN

## 2016-05-12 MED ORDER — FENTANYL CITRATE (PF) 100 MCG/2ML IJ SOLN
INTRAMUSCULAR | Status: AC
  Filled 2016-05-12: qty 2

## 2016-05-12 MED ORDER — PROPOFOL 10 MG/ML IV BOLUS
INTRAVENOUS | Status: AC
  Filled 2016-05-12: qty 40

## 2016-05-12 MED ORDER — CEFAZOLIN SODIUM-DEXTROSE 2-4 GM/100ML-% IV SOLN
INTRAVENOUS | Status: AC
  Filled 2016-05-12: qty 100

## 2016-05-12 MED ORDER — MIDAZOLAM HCL 2 MG/2ML IJ SOLN
INTRAMUSCULAR | Status: AC
  Filled 2016-05-12: qty 2

## 2016-05-12 MED ORDER — SODIUM CHLORIDE 0.9 % IV SOLN
INTRAVENOUS | Status: DC
  Administered 2016-05-12: 12:00:00 via INTRAVENOUS

## 2016-05-12 MED ORDER — FENTANYL CITRATE (PF) 100 MCG/2ML IJ SOLN
INTRAMUSCULAR | Status: DC | PRN
  Administered 2016-05-12: 100 ug via INTRAVENOUS

## 2016-05-12 MED ORDER — FAMOTIDINE 20 MG PO TABS
20.0000 mg | ORAL_TABLET | Freq: Once | ORAL | Status: AC
Start: 2016-05-12 — End: 2016-05-12
  Administered 2016-05-12: 20 mg via ORAL

## 2016-05-12 MED ORDER — SEVOFLURANE IN SOLN
RESPIRATORY_TRACT | Status: AC
  Filled 2016-05-12: qty 250

## 2016-05-12 MED ORDER — ROCURONIUM BROMIDE 100 MG/10ML IV SOLN
INTRAVENOUS | Status: DC | PRN
  Administered 2016-05-12: 20 mg via INTRAVENOUS
  Administered 2016-05-12: 30 mg via INTRAVENOUS

## 2016-05-12 MED ORDER — KETOROLAC TROMETHAMINE 30 MG/ML IJ SOLN
INTRAMUSCULAR | Status: AC
  Filled 2016-05-12: qty 1

## 2016-05-12 MED ORDER — EPHEDRINE 5 MG/ML INJ
INTRAVENOUS | Status: AC
  Filled 2016-05-12: qty 10

## 2016-05-12 MED ORDER — DEXAMETHASONE SODIUM PHOSPHATE 10 MG/ML IJ SOLN
INTRAMUSCULAR | Status: DC | PRN
  Administered 2016-05-12: 5 mg via INTRAVENOUS

## 2016-05-12 MED ORDER — ONDANSETRON HCL 4 MG/2ML IJ SOLN
INTRAMUSCULAR | Status: DC | PRN
  Administered 2016-05-12: 4 mg via INTRAVENOUS

## 2016-05-12 MED ORDER — SUCCINYLCHOLINE CHLORIDE 200 MG/10ML IV SOSY
PREFILLED_SYRINGE | INTRAVENOUS | Status: AC
  Filled 2016-05-12: qty 10

## 2016-05-12 MED ORDER — MIDAZOLAM HCL 2 MG/2ML IJ SOLN
INTRAMUSCULAR | Status: DC | PRN
  Administered 2016-05-12: 2 mg via INTRAVENOUS

## 2016-05-12 MED ORDER — PHENYLEPHRINE HCL 10 MG/ML IJ SOLN
INTRAMUSCULAR | Status: DC | PRN
  Administered 2016-05-12: 80 ug via INTRAVENOUS
  Administered 2016-05-12 (×5): 40 ug via INTRAVENOUS
  Administered 2016-05-12: 80 ug via INTRAVENOUS
  Administered 2016-05-12 (×2): 40 ug via INTRAVENOUS
  Administered 2016-05-12: 100 ug via INTRAVENOUS

## 2016-05-12 MED ORDER — MIDAZOLAM HCL 2 MG/2ML IJ SOLN
2.0000 mg | Freq: Once | INTRAMUSCULAR | Status: AC
  Administered 2016-05-12: 2 mg via INTRAVENOUS

## 2016-05-12 MED ORDER — SUCCINYLCHOLINE CHLORIDE 20 MG/ML IJ SOLN
INTRAMUSCULAR | Status: DC | PRN
  Administered 2016-05-12: 180 mg via INTRAVENOUS

## 2016-05-12 MED ORDER — FAMOTIDINE 20 MG PO TABS
ORAL_TABLET | ORAL | Status: AC
  Administered 2016-05-12: 20 mg via ORAL
  Filled 2016-05-12: qty 1

## 2016-05-12 MED ORDER — LIDOCAINE-EPINEPHRINE 0.5 %-1:200000 IJ SOLN
INTRAMUSCULAR | Status: DC | PRN
  Administered 2016-05-12: 8 mL
  Administered 2016-05-12: 5 mL

## 2016-05-12 MED ORDER — LIDOCAINE-EPINEPHRINE 0.5 %-1:200000 IJ SOLN
INTRAMUSCULAR | Status: AC
  Filled 2016-05-12: qty 1

## 2016-05-12 MED ORDER — ROPIVACAINE HCL 5 MG/ML IJ SOLN
INTRAMUSCULAR | Status: DC | PRN
  Administered 2016-05-12: 30 mL via EPIDURAL

## 2016-05-12 MED ORDER — SUGAMMADEX SODIUM 200 MG/2ML IV SOLN
INTRAVENOUS | Status: DC | PRN
  Administered 2016-05-12: 200 mg via INTRAVENOUS

## 2016-05-12 MED ORDER — KETOROLAC TROMETHAMINE 30 MG/ML IJ SOLN
INTRAMUSCULAR | Status: DC | PRN
  Administered 2016-05-12: 30 mg via INTRAVENOUS

## 2016-05-12 MED ORDER — PROPOFOL 10 MG/ML IV BOLUS
INTRAVENOUS | Status: DC | PRN
  Administered 2016-05-12: 50 mg via INTRAVENOUS
  Administered 2016-05-12: 100 mg via INTRAVENOUS

## 2016-05-12 MED ORDER — OXYCODONE-ACETAMINOPHEN 5-325 MG PO TABS: 1.0000 | ORAL_TABLET | ORAL | 0 refills | Status: DC | PRN

## 2016-05-12 MED ORDER — PHENYLEPHRINE HCL 10 MG/ML IJ SOLN
INTRAMUSCULAR | Status: AC
  Filled 2016-05-12: qty 2

## 2016-05-12 MED ORDER — OXYCODONE-ACETAMINOPHEN 5-325 MG PO TABS: 1.0000 | ORAL_TABLET | ORAL | Status: DC | PRN

## 2016-05-12 MED ORDER — PHENYLEPHRINE 40 MCG/ML (10ML) SYRINGE FOR IV PUSH (FOR BLOOD PRESSURE SUPPORT)
PREFILLED_SYRINGE | INTRAVENOUS | Status: AC
  Filled 2016-05-12: qty 10

## 2016-05-12 MED ORDER — LIDOCAINE 2% (20 MG/ML) 5 ML SYRINGE
INTRAMUSCULAR | Status: AC
  Filled 2016-05-12: qty 5

## 2016-05-12 MED ORDER — ONDANSETRON HCL 4 MG PO TABS: 4.0000 mg | ORAL_TABLET | Freq: Four times a day (QID) | ORAL | Status: DC | PRN

## 2016-05-12 MED ORDER — LIDOCAINE HCL (CARDIAC) 20 MG/ML IV SOLN
INTRAVENOUS | Status: DC | PRN
  Administered 2016-05-12: 100 mg via INTRAVENOUS

## 2016-05-12 MED ORDER — CEFAZOLIN SODIUM-DEXTROSE 2-4 GM/100ML-% IV SOLN
2.0000 g | Freq: Once | INTRAVENOUS | Status: AC
  Administered 2016-05-12: 2 g via INTRAVENOUS

## 2016-05-12 MED ORDER — ROPIVACAINE HCL 5 MG/ML IJ SOLN
INTRAMUSCULAR | Status: AC
  Filled 2016-05-12: qty 20

## 2016-05-12 MED ORDER — LIDOCAINE HCL 2 % EX GEL
CUTANEOUS | Status: AC
  Filled 2016-05-12: qty 5

## 2016-05-12 MED ORDER — LACTATED RINGERS IV SOLN
INTRAVENOUS | Status: DC | PRN
  Administered 2016-05-12 (×2): via INTRAVENOUS

## 2016-05-12 MED ORDER — BUPIVACAINE HCL (PF) 0.25 % IJ SOLN
INTRAMUSCULAR | Status: AC
  Filled 2016-05-12: qty 30

## 2016-05-12 MED ORDER — EPHEDRINE SULFATE 50 MG/ML IJ SOLN
INTRAMUSCULAR | Status: DC | PRN
  Administered 2016-05-12: 10 mg via INTRAVENOUS

## 2016-05-12 MED ORDER — SUGAMMADEX SODIUM 200 MG/2ML IV SOLN
INTRAVENOUS | Status: AC
  Filled 2016-05-12: qty 2

## 2016-05-12 MED ORDER — ROCURONIUM BROMIDE 50 MG/5ML IV SOSY
PREFILLED_SYRINGE | INTRAVENOUS | Status: AC
  Filled 2016-05-12: qty 5

## 2016-05-12 MED ORDER — MIDAZOLAM HCL 2 MG/2ML IJ SOLN
INTRAMUSCULAR | Status: AC
  Administered 2016-05-12: 2 mg via INTRAVENOUS
  Filled 2016-05-12: qty 2

## 2016-05-12 MED ORDER — FENTANYL CITRATE (PF) 100 MCG/2ML IJ SOLN: 25.0000 ug | INTRAMUSCULAR | Status: DC | PRN

## 2016-05-12 SURGICAL SUPPLY — 71 items
ANCHOR SUT 5.5 SPEEDSCREW (Screw) ×3 IMPLANT
BANDAGE ELASTIC 4 LF NS (GAUZE/BANDAGES/DRESSINGS) IMPLANT
BANDAGE STRETCH 3X4.1 STRL (GAUZE/BANDAGES/DRESSINGS) IMPLANT
BLADE SURG 15 STRL LF DISP TIS (BLADE) ×6 IMPLANT
BLADE SURG 15 STRL SS (BLADE) ×3
BLADE SURG MINI STRL (BLADE) ×3 IMPLANT
BNDG COHESIVE 4X5 TAN STRL (GAUZE/BANDAGES/DRESSINGS) ×3 IMPLANT
BNDG ESMARK 4X12 TAN STRL LF (GAUZE/BANDAGES/DRESSINGS) IMPLANT
BNDG ESMARK 6X12 TAN STRL LF (GAUZE/BANDAGES/DRESSINGS) IMPLANT
BNDG GAUZE 4.5X4.1 6PLY STRL (MISCELLANEOUS) ×3 IMPLANT
CANISTER SUCT 1200ML W/VALVE (MISCELLANEOUS) ×3 IMPLANT
DECANTER SPIKE VIAL GLASS SM (MISCELLANEOUS) ×3 IMPLANT
DERMABOND ADVANCED (GAUZE/BANDAGES/DRESSINGS) ×1
DERMABOND ADVANCED .7 DNX12 (GAUZE/BANDAGES/DRESSINGS) ×2 IMPLANT
DRAPE EXTREMITY 106X87X128.5 (DRAPES) ×3 IMPLANT
DRAPE FLUOR MINI C-ARM 54X84 (DRAPES) ×3 IMPLANT
DRAPE IMP U-DRAPE 54X76 (DRAPES) ×3 IMPLANT
DRSG TEGADERM 4X4.75 (GAUZE/BANDAGES/DRESSINGS) ×3 IMPLANT
DURAPREP 26ML APPLICATOR (WOUND CARE) ×9 IMPLANT
ELECT REM PT RETURN 9FT ADLT (ELECTROSURGICAL) ×3
ELECTRODE REM PT RTRN 9FT ADLT (ELECTROSURGICAL) ×2 IMPLANT
GAUZE PETRO XEROFOAM 1X8 (MISCELLANEOUS) ×3 IMPLANT
GAUZE SPONGE 4X4 12PLY STRL (GAUZE/BANDAGES/DRESSINGS) ×3 IMPLANT
GAUZE STRETCH 2X75IN STRL (MISCELLANEOUS) IMPLANT
GLOVE BIO SURGEON STRL SZ7.5 (GLOVE) ×9 IMPLANT
GLOVE INDICATOR 8.0 STRL GRN (GLOVE) ×3 IMPLANT
GOWN STRL REUS W/ TWL LRG LVL3 (GOWN DISPOSABLE) ×6 IMPLANT
GOWN STRL REUS W/TWL LRG LVL3 (GOWN DISPOSABLE) ×3
HANDLE YANKAUER SUCT BULB TIP (MISCELLANEOUS) ×6 IMPLANT
KIT RM TURNOVER STRD PROC AR (KITS) ×3 IMPLANT
LABEL OR SOLS (LABEL) ×3 IMPLANT
NDL MAYO CATGUT SZ5 (NEEDLE)
NDL SAFETY ECLIPSE 18X1.5 (NEEDLE) ×2 IMPLANT
NDL SUT 5 .5 CRC TPR PNT MAYO (NEEDLE) IMPLANT
NEEDLE FILTER BLUNT 18X 1/2SAF (NEEDLE)
NEEDLE FILTER BLUNT 18X1 1/2 (NEEDLE) IMPLANT
NEEDLE HYPO 18GX1.5 SHARP (NEEDLE) ×1
NEEDLE HYPO 25X1 1.5 SAFETY (NEEDLE) ×6 IMPLANT
NS IRRIG 500ML POUR BTL (IV SOLUTION) ×3 IMPLANT
PACK EXTREMITY ARMC (MISCELLANEOUS) ×3 IMPLANT
PAD CAST CTTN 4X4 STRL (SOFTGOODS) ×2 IMPLANT
PADDING CAST COTTON 4X4 STRL (SOFTGOODS) ×1
RASP SM TEAR CROSS CUT (RASP) ×6 IMPLANT
SPLINT CAST 1 STEP 5X30 WHT (MISCELLANEOUS) IMPLANT
SPLINT FAST PLASTER 5X30 (CAST SUPPLIES)
SPLINT PLASTER CAST FAST 5X30 (CAST SUPPLIES) IMPLANT
SPONGE LAP 18X18 5 PK (GAUZE/BANDAGES/DRESSINGS) IMPLANT
SPONGE XRAY 4X4 16PLY STRL (MISCELLANEOUS) ×3 IMPLANT
STOCKINETTE M/LG 89821 (MISCELLANEOUS) ×6 IMPLANT
STRIP CLOSURE SKIN 1/2X4 (GAUZE/BANDAGES/DRESSINGS) IMPLANT
SUT FIBERWIRE #2 38 T-5 BLUE (SUTURE) ×3
SUT MNCRL 3 0 RB1 (SUTURE) ×4 IMPLANT
SUT MNCRL+ 5-0 VIOLET P-3 (SUTURE) ×2 IMPLANT
SUT MONOCRYL 3 0 RB1 (SUTURE) ×2
SUT MONOCRYL 5-0 (SUTURE) ×1
SUT PDS AB 0 CT1 27 (SUTURE) IMPLANT
SUT VIC AB 0 SH 27 (SUTURE) IMPLANT
SUT VIC AB 2-0 SH 27 (SUTURE) ×1
SUT VIC AB 2-0 SH 27XBRD (SUTURE) ×2 IMPLANT
SUT VIC AB 3-0 SH 27 (SUTURE)
SUT VIC AB 3-0 SH 27X BRD (SUTURE) IMPLANT
SUT VIC AB 4-0 FS2 27 (SUTURE) ×6 IMPLANT
SUT VICRYL AB 3-0 FS1 BRD 27IN (SUTURE) ×3 IMPLANT
SUTURE FIBERWR #2 38 T-5 BLUE (SUTURE) ×2 IMPLANT
SWABSTK COMLB BENZOIN TINCTURE (MISCELLANEOUS) ×6 IMPLANT
SYR 10ML LL (SYRINGE) ×3 IMPLANT
SYR 3ML LL SCALE MARK (SYRINGE) ×3 IMPLANT
SYRINGE 10CC LL (SYRINGE) ×3 IMPLANT
TUBING CONNECTING 10 (TUBING) ×3 IMPLANT
WAND TOPAZ MICRO DEBRIDER (MISCELLANEOUS) ×3 IMPLANT
WIRE MAGNUM (SUTURE) IMPLANT

## 2016-05-12 NOTE — Anesthesia Preprocedure Evaluation (Addendum)
Anesthesia Evaluation  Patient identified by MRN, date of birth, ID band Patient awake    Reviewed: Allergy & Precautions, NPO status , Patient's Chart, lab work & pertinent test results, reviewed documented beta blocker date and time   Airway Mallampati: III  TM Distance: >3 FB     Dental  (+) Chipped   Pulmonary Current Smoker,           Cardiovascular hypertension, Pt. on medications and Pt. on home beta blockers      Neuro/Psych PSYCHIATRIC DISORDERS Anxiety Depression  Neuromuscular disease    GI/Hepatic hiatal hernia,   Endo/Other  diabetes, Type 2Hypothyroidism   Renal/GU      Musculoskeletal   Abdominal   Peds  (+) Neurological problem Hematology   Anesthesia Other Findings Obese. IRBBB. No cardiac symptoms.  Reproductive/Obstetrics                            Anesthesia Physical Anesthesia Plan  ASA: III  Anesthesia Plan: General   Post-op Pain Management:    Induction: Intravenous  Airway Management Planned: Oral ETT and LMA  Additional Equipment:   Intra-op Plan:   Post-operative Plan:   Informed Consent: I have reviewed the patients History and Physical, chart, labs and discussed the procedure including the risks, benefits and alternatives for the proposed anesthesia with the patient or authorized representative who has indicated his/her understanding and acceptance.     Plan Discussed with: CRNA  Anesthesia Plan Comments:         Anesthesia Quick Evaluation

## 2016-05-12 NOTE — H&P (Signed)
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  The various methods of treatment have been discussed with the patient.  No guarantees were given.  After consideration of risks, benefits and other options for treatment, the patient has consented to surgery.  I have reviewed the patients' chart and labs.    Patient Vitals for the past 24 hrs:  BP Temp Temp src Pulse Resp SpO2 Height Weight  05/12/16 1152 (!) 126/57 98.1 F (36.7 C) Oral 80 16 100 % 5\' 4"  (1.626 m) 98 kg (216 lb)    A history and physical examination was performed in my office.  The patient was reexamined.  There have been no changes to this history and physical examination.  Samara Deist A

## 2016-05-12 NOTE — Anesthesia Procedure Notes (Signed)
Anesthesia Regional Block:  Popliteal block  Pre-Anesthetic Checklist: ,, timeout performed, Correct Patient, Correct Site, Correct Laterality, Correct Procedure, Correct Position, site marked, Risks and benefits discussed,  Surgical consent,  Pre-op evaluation,  At surgeon's request and post-op pain management   Prep: chloraprep       Needles:  Injection technique: Single-shot  Needle Type: Echogenic Needle     Needle Length: 9cm 9 cm Needle Gauge: 21 and 21 G    Additional Needles:  Procedures: ultrasound guided (picture in chart) Popliteal block Narrative:  Injection made incrementally with aspirations every 5 mL.  Performed by: Personally  Anesthesiologist: Katy Fitch K  Additional Notes: Functioning IV was confirmed and monitors were applied.  A echogenic needle was used. Sterile prep,hand hygiene and sterile gloves were used.  Negative aspiration and negative test dose prior to incremental administration of local anesthetic. The patient tolerated the procedure well with no immediate complications. 1315 ropivicaine 0.5% 74ml.

## 2016-05-12 NOTE — Op Note (Signed)
Operative note   Surgeon:Tristan Proto Lawyer: None    Preop diagnosis: 1. Left dorsal midfoot exostosis first met cuneiform and navicular cuneiform joint and second met cuneiform joint 2. Achilles insertional tendinitis left heel 3. Calcaneal exostosis    Postop diagnosis: Same    Procedure: 1. Dorsal midfoot exostectomy first met cuneiform, navicular cuneiform, and second met cuneiform joints. 2. Secondary repair Achilles tendon left heel 3. Calcaneal exostectomy left calcaneus    EBL: Minimal    Anesthesia:regional and general    Hemostasis: Epinephrine infiltrated along the incision site    Specimen: Exostosis and Achilles tendinitis left heel    Complications: None    Operative indications:Regina Pierce is an 61 y.o. that presents today for surgical intervention.  The risks/benefits/alternatives/complications have been discussed and consent has been given.    Procedure:  Patient was brought into the OR and placed on the operating table in thesupine position. After anesthesia was obtained theleft lower extremity was prepped and draped in usual sterile fashion.  Attention was directed to the dorsal aspect of the met left midfoot where a longitudinal incision was made overlying the first and second met cuneiform joint and navicular cuneiform joint region. Sharp and blunt dissection carried down through the superficial fascia to the deep fascial layer. Care was taken to retract all vital neurovascular structures. Subperiosteal dissection was then undertaken exposing the first and second met cuneiform joint and navicular cuneiform joint. At this time very prominent exostosis was noted and with the use of an osteotome and power rasp I was able to remove all of the prominent bone. Wounds were flushed with copious amounts or irrigation. Fluoroscopy revealed good removal of the exostosis. The wound was then closed in layers with a 4-0 Vicryl for the deeper and subcutaneous tissue and a 5-0  Monocryl in a subcuticular fashion. Dermabond was then applied overlying the incision site. This was then covered with a Tegaderm. At this time the sterile prep and drape were removed and the patient was then flipped into the prone position. Re-sterile prep and drape was then performed. Attention was then directed to the posterior aspect of the left heel at the Achilles insertional site were longitudinal incision was performed. Sharp and blunt dissection carried down to the peritenon. Peritenon was then incised this exposed the Achilles tendon at its insertion. A longitudinal incision was made into the Achilles tendon at its insertion and medial and lateral dissection was undertaken. A very large prominent posterior calcaneal exostosis was noted. Ring and scarring of the Achilles insertional site were noted as well. The exostosis was then excised with the use of a osteotome and power rasp. This was taken down to good healthy normal tissue. A sub-Achilles tendon bursa was noted  at the superior portion of the calcaneal tuber and this was excised. The superior calcaneal tuberosity was also smoothed with a power rasp. All areas of the flushed with copious amounts or irrigation. At this time inspection of the Achilles tendon didn't reveal the thickened insertional tendinitis. A large amount of the thickened Achilles tendon was then excised at this time down to good healthy normal tendon. This was then flushed with copious varus or irrigation. The longitudinal incisional splint was then repaired with a 3-0 Vicryl. The tendon was then infiltrated with a Topaz wand. 10 was then placed into the calcaneal bleeding bone with use of a 5.5 mm speed screw. This was compressed against the bone in an anatomic relaxed plantarflexed position. The wound  was flushed with copious varus or irrigation finally. Layered closure was performed with a 4-0 Vicryl for the peritenon. 3-0 Vicryl for the subcutaneous tissue and a 5-0 Monocryl  along incision site. 0.25% Marcaine was then infiltrated along all areas. Thus a sterile bulky dressing was then applied. She was then placed in an equalizer walker boot and a neutral position.   Patient tolerated the procedure and anesthesia well.  Was transported from the OR to the PACU with all vital signs stable and vascular status intact. To be discharged per routine protocol.  Will follow up in approximately 1 week in the outpatient clinic.

## 2016-05-12 NOTE — Anesthesia Procedure Notes (Signed)
Anesthesia Regional Block:  Popliteal blockPopliteal block Narrative:

## 2016-05-12 NOTE — Discharge Instructions (Signed)
AMBULATORY SURGERY  DISCHARGE INSTRUCTIONS   1) The drugs that you were given will stay in your system until tomorrow so for the next 24 hours you should not:  A) Drive an automobile B) Make any legal decisions C) Drink any alcoholic beverage   2) You may resume regular meals tomorrow.  Today it is better to start with liquids and gradually work up to solid foods.  You may eat anything you prefer, but it is better to start with liquids, then soup and crackers, and gradually work up to solid foods.   3) Please notify your doctor immediately if you have any unusual bleeding, trouble breathing, redness and pain at the surgery site, drainage, fever, or pain not relieved by medication. 4)   5) Your post-operative visit with Dr.                                     is: Date:                        Time:    Please call to schedule your post-operative visit.  6) Additional Instructions:      [place]  POST OPERATIVE INSTRUCTIONS FOR DR. Pixley   1. Take your medication as prescribed.  Pain medication should be taken only as needed.  2. Keep the dressing clean, dry and intact.  3. Keep your foot elevated above the heart level for the first 48 hours.  4. Walking to the bathroom and brief periods of walking are acceptable, unless we have instructed you to be non-weight bearing.  5. Always wear your post-op shoe when walking.  Always use your crutches if you are to be non-weight bearing.  6. Do not take a shower. Baths are permissible as long as the foot is kept out of the water.   7. Every hour you are awake:  - Bend your knee 15 times. - Massage calf 15 times  8. Call [facility] [phone]) if any of the following problems occur: - You develop a temperature or fever. - The bandage becomes saturated with blood. - Medication does not stop your pain. - Injury of the  foot occurs. - Any symptoms of infection including redness, odor, or red streaks running from wound.

## 2016-05-12 NOTE — Transfer of Care (Signed)
Immediate Anesthesia Transfer of Care Note  Patient: Regina Pierce  Procedure(s) Performed: Procedure(s): ACHILLES TENDON REPAIR (Left) CALCANEAL EXOSTECTOMY AND DORSAL EXOSTECTOMY (Left)  Patient Location: PACU  Anesthesia Type:General  Level of Consciousness: awake and alert   Airway & Oxygen Therapy: Patient Spontanous Breathing and Patient connected to face mask oxygen  Post-op Assessment: Report given to RN and Post -op Vital signs reviewed and stable  Post vital signs: Reviewed and stable  Last Vitals:  Vitals:   05/12/16 1754 05/12/16 1814  BP: (!) 142/58 (!) 134/51  Pulse: 75 72  Resp: 16 16  Temp: 36.3 C     Last Pain:  Vitals:   05/12/16 1814  TempSrc:   PainSc: 0-No pain         Complications: No apparent anesthesia complications

## 2016-05-12 NOTE — Anesthesia Procedure Notes (Signed)
Procedure Name: Intubation Date/Time: 05/12/2016 1:37 PM Performed by: Jennette Bill Pre-anesthesia Checklist: Patient identified, Emergency Drugs available, Suction available, Patient being monitored and Timeout performed Patient Re-evaluated:Patient Re-evaluated prior to inductionOxygen Delivery Method: Circle system utilized and Simple face mask Preoxygenation: Pre-oxygenation with 100% oxygen Intubation Type: IV induction Laryngoscope Size: Mac and 3 Grade View: Grade III Tube type: Oral Tube size: 7.5 mm Airway Equipment and Method: Patient positioned with wedge pillow Placement Confirmation: ETT inserted through vocal cords under direct vision,  positive ETCO2 and CO2 detector Secured at: 22 cm Tube secured with: Tape Dental Injury: Teeth and Oropharynx as per pre-operative assessment

## 2016-05-12 NOTE — Anesthesia Postprocedure Evaluation (Addendum)
Anesthesia Post Note  Patient: Regina Pierce  Procedure(s) Performed: Procedure(s) (LRB): ACHILLES TENDON REPAIR (Left) CALCANEAL EXOSTECTOMY AND DORSAL EXOSTECTOMY (Left)  Patient location during evaluation: PACU Anesthesia Type: General Level of consciousness: awake and alert Pain management: pain level controlled Vital Signs Assessment: post-procedure vital signs reviewed and stable Respiratory status: spontaneous breathing, nonlabored ventilation, respiratory function stable and patient connected to nasal cannula oxygen Cardiovascular status: blood pressure returned to baseline and stable Postop Assessment: no signs of nausea or vomiting Anesthetic complications: no Comments: Patient had prolonged emergence and copious lung secretions requiring ETT suctioning prior to extubation.  On arrival to the PACU she held her sats well and did not have any further respiratory difficulty.     Last Vitals:  Vitals:   05/12/16 1754 05/12/16 1814  BP: (!) 142/58 (!) 134/51  Pulse: 75 72  Resp: 16 16  Temp: 36.3 C     Last Pain:  Vitals:   05/12/16 1814  TempSrc:   PainSc: 0-No pain                 Martha Clan

## 2016-05-13 ENCOUNTER — Encounter: Payer: Self-pay | Admitting: Podiatry

## 2016-05-14 ENCOUNTER — Encounter: Payer: BLUE CROSS/BLUE SHIELD | Admitting: Physician Assistant

## 2016-05-14 LAB — SURGICAL PATHOLOGY

## 2016-06-15 ENCOUNTER — Other Ambulatory Visit: Payer: Self-pay | Admitting: Physician Assistant

## 2016-06-15 DIAGNOSIS — E039 Hypothyroidism, unspecified: Secondary | ICD-10-CM

## 2016-06-15 DIAGNOSIS — F419 Anxiety disorder, unspecified: Secondary | ICD-10-CM

## 2016-06-15 DIAGNOSIS — E785 Hyperlipidemia, unspecified: Secondary | ICD-10-CM

## 2016-06-15 DIAGNOSIS — I10 Essential (primary) hypertension: Secondary | ICD-10-CM

## 2016-06-16 ENCOUNTER — Other Ambulatory Visit: Payer: Self-pay

## 2016-06-16 DIAGNOSIS — F419 Anxiety disorder, unspecified: Secondary | ICD-10-CM

## 2016-06-16 MED ORDER — LORAZEPAM 1 MG PO TABS: 1.0000 mg | ORAL_TABLET | Freq: Every day | ORAL | 3 refills | Status: DC | PRN

## 2016-06-16 NOTE — Telephone Encounter (Signed)
Pharmacy requesting refills. Thanks!  

## 2016-06-17 NOTE — Telephone Encounter (Signed)
rx called in-aa 

## 2016-06-24 ENCOUNTER — Ambulatory Visit (INDEPENDENT_AMBULATORY_CARE_PROVIDER_SITE_OTHER): Payer: BLUE CROSS/BLUE SHIELD | Admitting: Physician Assistant

## 2016-06-24 ENCOUNTER — Encounter: Payer: Self-pay | Admitting: Physician Assistant

## 2016-06-24 VITALS — BP 140/80 | HR 80 | Temp 98.2°F | Resp 16

## 2016-06-24 DIAGNOSIS — Z1231 Encounter for screening mammogram for malignant neoplasm of breast: Secondary | ICD-10-CM | POA: Diagnosis not present

## 2016-06-24 DIAGNOSIS — E7800 Pure hypercholesterolemia, unspecified: Secondary | ICD-10-CM

## 2016-06-24 DIAGNOSIS — E039 Hypothyroidism, unspecified: Secondary | ICD-10-CM

## 2016-06-24 DIAGNOSIS — R87618 Other abnormal cytological findings on specimens from cervix uteri: Secondary | ICD-10-CM

## 2016-06-24 DIAGNOSIS — Z1159 Encounter for screening for other viral diseases: Secondary | ICD-10-CM | POA: Diagnosis not present

## 2016-06-24 DIAGNOSIS — R8789 Other abnormal findings in specimens from female genital organs: Secondary | ICD-10-CM | POA: Diagnosis not present

## 2016-06-24 DIAGNOSIS — E119 Type 2 diabetes mellitus without complications: Secondary | ICD-10-CM | POA: Diagnosis not present

## 2016-06-24 DIAGNOSIS — I10 Essential (primary) hypertension: Secondary | ICD-10-CM | POA: Diagnosis not present

## 2016-06-24 DIAGNOSIS — Z124 Encounter for screening for malignant neoplasm of cervix: Secondary | ICD-10-CM

## 2016-06-24 DIAGNOSIS — Z0000 Encounter for general adult medical examination without abnormal findings: Secondary | ICD-10-CM

## 2016-06-24 DIAGNOSIS — Z1239 Encounter for other screening for malignant neoplasm of breast: Secondary | ICD-10-CM

## 2016-06-24 DIAGNOSIS — R87612 Low grade squamous intraepithelial lesion on cytologic smear of cervix (LGSIL): Secondary | ICD-10-CM

## 2016-06-24 DIAGNOSIS — Z1211 Encounter for screening for malignant neoplasm of colon: Secondary | ICD-10-CM | POA: Diagnosis not present

## 2016-06-24 NOTE — Patient Instructions (Signed)
Health Maintenance, Female Adopting a healthy lifestyle and getting preventive care can go a long way to promote health and wellness. Talk with your health care provider about what schedule of regular examinations is right for you. This is a good chance for you to check in with your provider about disease prevention and staying healthy. In between checkups, there are plenty of things you can do on your own. Experts have done a lot of research about which lifestyle changes and preventive measures are most likely to keep you healthy. Ask your health care provider for more information. Weight and diet Eat a healthy diet  Be sure to include plenty of vegetables, fruits, low-fat dairy products, and lean protein.  Do not eat a lot of foods high in solid fats, added sugars, or salt.  Get regular exercise. This is one of the most important things you can do for your health.  Most adults should exercise for at least 150 minutes each week. The exercise should increase your heart rate and make you sweat (moderate-intensity exercise).  Most adults should also do strengthening exercises at least twice a week. This is in addition to the moderate-intensity exercise. Maintain a healthy weight  Body mass index (BMI) is a measurement that can be used to identify possible weight problems. It estimates body fat based on height and weight. Your health care provider can help determine your BMI and help you achieve or maintain a healthy weight.  For females 62 years of age and older:  A BMI below 18.5 is considered underweight.  A BMI of 18.5 to 24.9 is normal.  A BMI of 25 to 29.9 is considered overweight.  A BMI of 30 and above is considered obese. Watch levels of cholesterol and blood lipids  You should start having your blood tested for lipids and cholesterol at 59 years of age, then have this test every 5 years.  You may need to have your cholesterol levels checked more often if:  Your lipid or  cholesterol levels are high.  You are older than 59 years of age.  You are at high risk for heart disease. Cancer screening Lung Cancer  Lung cancer screening is recommended for adults 59-21 years old who are at high risk for lung cancer because of a history of smoking.  A yearly low-dose CT scan of the lungs is recommended for people who:  Currently smoke.  Have quit within the past 15 years.  Have at least a 30-pack-year history of smoking. A pack year is smoking an average of one pack of cigarettes a day for 1 year.  Yearly screening should continue until it has been 15 years since you quit.  Yearly screening should stop if you develop a health problem that would prevent you from having lung cancer treatment. Breast Cancer  Practice breast self-awareness. This means understanding how your breasts normally appear and feel.  It also means doing regular breast self-exams. Let your health care provider know about any changes, no matter how small.  If you are in your 20s or 30s, you should have a clinical breast exam (CBE) by a health care provider every 1-3 years as part of a regular health exam.  If you are 25 or older, have a CBE every year. Also consider having a breast X-ray (mammogram) every year.  If you have a family history of breast cancer, talk to your health care provider about genetic screening.  If you are at high risk for breast cancer, talk  to your health care provider about having an MRI and a mammogram every year.  Breast cancer gene (BRCA) assessment is recommended for women who have family members with BRCA-related cancers. BRCA-related cancers include:  Breast.  Ovarian.  Tubal.  Peritoneal cancers.  Results of the assessment will determine the need for genetic counseling and BRCA1 and BRCA2 testing. Cervical Cancer  Your health care provider may recommend that you be screened regularly for cancer of the pelvic organs (ovaries, uterus, and vagina).  This screening involves a pelvic examination, including checking for microscopic changes to the surface of your cervix (Pap test). You may be encouraged to have this screening done every 3 years, beginning at age 24.  For women ages 66-65, health care providers may recommend pelvic exams and Pap testing every 3 years, or they may recommend the Pap and pelvic exam, combined with testing for human papilloma virus (HPV), every 5 years. Some types of HPV increase your risk of cervical cancer. Testing for HPV may also be done on women of any age with unclear Pap test results.  Other health care providers may not recommend any screening for nonpregnant women who are considered low risk for pelvic cancer and who do not have symptoms. Ask your health care provider if a screening pelvic exam is right for you.  If you have had past treatment for cervical cancer or a condition that could lead to cancer, you need Pap tests and screening for cancer for at least 20 years after your treatment. If Pap tests have been discontinued, your risk factors (such as having a new sexual partner) need to be reassessed to determine if screening should resume. Some women have medical problems that increase the chance of getting cervical cancer. In these cases, your health care provider may recommend more frequent screening and Pap tests. Colorectal Cancer  This type of cancer can be detected and often prevented.  Routine colorectal cancer screening usually begins at 59 years of age and continues through 59 years of age.  Your health care provider may recommend screening at an earlier age if you have risk factors for colon cancer.  Your health care provider may also recommend using home test kits to check for hidden blood in the stool.  A small camera at the end of a tube can be used to examine your colon directly (sigmoidoscopy or colonoscopy). This is done to check for the earliest forms of colorectal cancer.  Routine  screening usually begins at age 41.  Direct examination of the colon should be repeated every 5-10 years through 59 years of age. However, you may need to be screened more often if early forms of precancerous polyps or small growths are found. Skin Cancer  Check your skin from head to toe regularly.  Tell your health care provider about any new moles or changes in moles, especially if there is a change in a mole's shape or color.  Also tell your health care provider if you have a mole that is larger than the size of a pencil eraser.  Always use sunscreen. Apply sunscreen liberally and repeatedly throughout the day.  Protect yourself by wearing long sleeves, pants, a wide-brimmed hat, and sunglasses whenever you are outside. Heart disease, diabetes, and high blood pressure  High blood pressure causes heart disease and increases the risk of stroke. High blood pressure is more likely to develop in:  People who have blood pressure in the high end of the normal range (130-139/85-89 mm Hg).  People who are overweight or obese.  People who are African American.  If you are 59-24 years of age, have your blood pressure checked every 3-5 years. If you are 34 years of age or older, have your blood pressure checked every year. You should have your blood pressure measured twice-once when you are at a hospital or clinic, and once when you are not at a hospital or clinic. Record the average of the two measurements. To check your blood pressure when you are not at a hospital or clinic, you can use:  An automated blood pressure machine at a pharmacy.  A home blood pressure monitor.  If you are between 29 years and 60 years old, ask your health care provider if you should take aspirin to prevent strokes.  Have regular diabetes screenings. This involves taking a blood sample to check your fasting blood sugar level.  If you are at a normal weight and have a low risk for diabetes, have this test once  every three years after 59 years of age.  If you are overweight and have a high risk for diabetes, consider being tested at a younger age or more often. Preventing infection Hepatitis B  If you have a higher risk for hepatitis B, you should be screened for this virus. You are considered at high risk for hepatitis B if:  You were born in a country where hepatitis B is common. Ask your health care provider which countries are considered high risk.  Your parents were born in a high-risk country, and you have not been immunized against hepatitis B (hepatitis B vaccine).  You have HIV or AIDS.  You use needles to inject street drugs.  You live with someone who has hepatitis B.  You have had sex with someone who has hepatitis B.  You get hemodialysis treatment.  You take certain medicines for conditions, including cancer, organ transplantation, and autoimmune conditions. Hepatitis C  Blood testing is recommended for:  Everyone born from [birth year] through 1963.  Anyone with known risk factors for hepatitis C. Sexually transmitted infections (STIs)  You should be screened for sexually transmitted infections (STIs) including gonorrhea and chlamydia if:  You are sexually active and are younger than 59 years of age.  You are older than 59 years of age and your health care provider tells you that you are at risk for this type of infection.  Your sexual activity has changed since you were last screened and you are at an increased risk for chlamydia or gonorrhea. Ask your health care provider if you are at risk.  If you do not have HIV, but are at risk, it may be recommended that you take a prescription medicine daily to prevent HIV infection. This is called pre-exposure prophylaxis (PrEP). You are considered at risk if:  You are sexually active and do not regularly use condoms or know the HIV status of your partner(s).  You take drugs by injection.  You are sexually active with a partner  who has HIV. Talk with your health care provider about whether you are at high risk of being infected with HIV. If you choose to begin PrEP, you should first be tested for HIV. You should then be tested every 3 months for as long as you are taking PrEP. Pregnancy  If you are premenopausal and you may become pregnant, ask your health care provider about preconception counseling.  If you may become pregnant, take 400 to 800 micrograms (mcg) of folic acid  every day.  If you want to prevent pregnancy, talk to your health care provider about birth control (contraception). Osteoporosis and menopause  Osteoporosis is a disease in which the bones lose minerals and strength with aging. This can result in serious bone fractures. Your risk for osteoporosis can be identified using a bone density scan.  If you are 4 years of age or older, or if you are at risk for osteoporosis and fractures, ask your health care provider if you should be screened.  Ask your health care provider whether you should take a calcium or vitamin D supplement to lower your risk for osteoporosis.  Menopause may have certain physical symptoms and risks.  Hormone replacement therapy may reduce some of these symptoms and risks. Talk to your health care provider about whether hormone replacement therapy is right for you. Follow these instructions at home:  Schedule regular health, dental, and eye exams.  Stay current with your immunizations.  Do not use any tobacco products including cigarettes, chewing tobacco, or electronic cigarettes.  If you are pregnant, do not drink alcohol.  If you are breastfeeding, limit how much and how often you drink alcohol.  Limit alcohol intake to no more than 1 drink per day for nonpregnant women. One drink equals 12 ounces of beer, 5 ounces of wine, or 1 ounces of hard liquor.  Do not use street drugs.  Do not share needles.  Ask your health care provider for help if you need support  or information about quitting drugs.  Tell your health care provider if you often feel depressed.  Tell your health care provider if you have ever been abused or do not feel safe at home. This information is not intended to replace advice given to you by your health care provider. Make sure you discuss any questions you have with your health care provider. Document Released: 10/26/2010 Document Revised: 09/18/2015 Document Reviewed: 01/14/2015 Elsevier Interactive Patient Education  2017 [employer].

## 2016-06-24 NOTE — Progress Notes (Signed)
Patient: Regina Pierce, Female    DOB: 1955/06/16, 61 y.o.   MRN: [id number] Visit Date: 06/24/2016  Today's Provider: Mar Daring, [name]   Chief Complaint  Patient presents with  . Annual Exam   Subjective:    Annual physical exam Regina Pierce is a 61 y.o. female who presents today for health maintenance and complete physical. She feels well. She reports exercising none. She reports she is sleeping fairly well.  Declined Influenza vaccine CPE:05/13/15 Eye Exam: none Pap:05/13/15-negative Mammogram:05/13/15-BI-RADS 1 Colon:06/16/11-Diverticulosis Recheck in 5 years -----------------------------------------------------------------   Review of Systems  Constitutional: Negative.   HENT: Positive for dental problem and ear discharge.   Eyes: Negative.   Respiratory: Negative.   Cardiovascular: Negative.   Gastrointestinal: Negative.   Endocrine: Negative.   Genitourinary: Negative.   Musculoskeletal: Negative.   Skin: Negative.   Allergic/Immunologic: Negative.   Neurological: Negative.   Hematological: Negative.   Psychiatric/Behavioral: The patient is nervous/anxious.     Social History      She  reports that she has been smoking Cigarettes.  She has a 25.00 pack-year smoking history. She has never used smokeless tobacco. She reports that she does not drink alcohol or use drugs.       Social History   Social History  . Marital status: Married    Spouse name: N/A  . Number of children: N/A  . Years of education: N/A   Social History Main Topics  . Smoking status: Current Every Day Smoker    Packs/day: 1.00    Years: 25.00    Types: Cigarettes  . Smokeless tobacco: Never Used  . Alcohol use No  . Drug use: No  . Sexual activity: Not Asked   Other Topics Concern  . None   Social History Narrative  . None    Past Medical History:  Diagnosis Date  . Anxiety   . Depression   . Diabetes mellitus without complication (HCC)    no meds  .  Hyperlipidemia   . Hypertension   . Thyroid disease      Patient Active Problem List   Diagnosis Date Noted  . Diabetes mellitus without complication ([place]) [id number]  . Tobacco abuse 05/13/2015  . Colon polyp 01/14/2015  . Clinical depression 01/14/2015  . 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 Hypercholesteremia 01/14/2015  . 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 Cyst of ovary 01/14/2015  . Diaphragmatic hernia 01/14/2015  . Anxiety 01/13/2015  . BP (high blood pressure) 01/13/2015  . Adult hypothyroidism 01/13/2015  . Hyperlipemia 11/05/2014  . Depression 11/05/2014    Past Surgical History:  Procedure Laterality Date  . ACHILLES TENDON SURGERY Left 05/12/2016   Procedure: ACHILLES TENDON REPAIR;  Surgeon: Samara Deist, DPM;  Location: [facility] ORS;  Service: Podiatry;  Laterality: Left;  . BONE EXOSTOSIS EXCISION Left 05/12/2016   Procedure: CALCANEAL EXOSTECTOMY AND DORSAL EXOSTECTOMY;  Surgeon: Samara Deist, DPM;  Location: [facility] ORS;  Service: Podiatry;  Laterality: Left;  . HAND SURGERY Bilateral 1999   carpal tunnel   . KNEE SURGERY  2010  . TUBAL LIGATION  1987    Family History        Family Status  Relation Status  . Mother Deceased  . Father Deceased  . Sister Deceased at age 72   heart defect  . Brother Alive  . Sister Deceased   [place] accident  . Sister Alive        Her family history  includes Hypertension in her sister.     No Known Allergies   Current Outpatient Prescriptions:  .  acetaminophen (TYLENOL) 500 MG tablet, Take 1,000 mg by mouth every 6 (six) hours as needed for mild pain., Disp: , Rfl:  .  aspirin 81 MG tablet, Take 81 mg by mouth every evening. , Disp: , Rfl:  .  levothyroxine (SYNTHROID, LEVOTHROID) 150 MCG tablet, TAKE 1 TABLET BY MOUTH DAILY, Disp: 30 tablet, Rfl: 0 .  lisinopril-hydrochlorothiazide (PRINZIDE,ZESTORETIC) 20-25 MG tablet, TAKE 1 TABLET BY MOUTH DAILY, Disp: 30 tablet, Rfl: 0 .  LORazepam  (ATIVAN) 1 MG tablet, Take 1 tablet (1 mg total) by mouth daily as needed for anxiety., Disp: 90 tablet, Rfl: 3 .  metoprolol succinate (TOPROL-XL) 25 MG 24 hr tablet, TAKE 1 TABLET BY MOUTH DAILY, Disp: 30 tablet, Rfl: 0 .  Multiple Vitamin (MULTI-VITAMINS) TABS, Take 2 tablets by mouth daily. , Disp: , Rfl:  .  naproxen sodium (ANAPROX) 220 MG tablet, Take 440 mg by mouth daily as needed (pain)., Disp: , Rfl:  .  simvastatin (ZOCOR) 10 MG tablet, TAKE 1 TABLET BY MOUTH AT BEDTIME, Disp: 30 tablet, Rfl: 0 .  venlafaxine XR (EFFEXOR-XR) 150 MG 24 hr capsule, TAKE 1 CAPSULE BY MOUTH ONCE DAILY, Disp: 30 capsule, Rfl: 0 .  oxyCODONE-acetaminophen (ROXICET) 5-325 MG tablet, Take 1-2 tablets by mouth every 4 (four) hours as needed for severe pain. (Patient not taking: Reported on 06/24/2016), Disp: 30 tablet, Rfl: 0   Patient Care Team: Mar Daring, [name] as PCP - General (Family Medicine)      Objective:   Vitals: BP 140/80 (BP Location: Right Arm, Patient Position: Sitting, Cuff Size: Normal)   Pulse 80   Temp 98.2 F (36.8 C) (Oral)   Resp 16    Vitals:   06/24/16 1425  BP: 140/80  Pulse: 80  Resp: 16  Temp: 98.2 F (36.8 C)  TempSrc: Oral     Physical Exam  Constitutional: She is oriented to person, place, and time. She appears well-developed and well-nourished. No distress.  HENT:  Head: Normocephalic and atraumatic.  Right Ear: Hearing, tympanic membrane, external ear and ear canal normal.  Left Ear: Hearing, tympanic membrane, external ear and ear canal normal.  Nose: Nose normal.  Mouth/Throat: Uvula is midline, oropharynx is clear and moist and mucous membranes are normal. No oropharyngeal exudate.  Eyes: Conjunctivae and EOM are normal. Pupils are equal, round, and reactive to light. Right eye exhibits no discharge. Left eye exhibits no discharge. No scleral icterus.  Neck: Normal range of motion. Neck supple. No JVD present. Carotid bruit is not present. No tracheal  deviation present. No thyromegaly present.  Cardiovascular: Normal rate, regular rhythm, normal heart sounds and intact distal pulses.  Exam reveals no gallop and no friction rub.   No murmur heard. Pulmonary/Chest: Effort normal and breath sounds normal. No respiratory distress. She has no wheezes. She has no rales. She exhibits no tenderness. Right breast exhibits no inverted nipple, no mass, no nipple discharge, no skin change and no tenderness. Left breast exhibits no inverted nipple, no mass, no nipple discharge, no skin change and no tenderness. Breasts are symmetrical.  Abdominal: Soft. Bowel sounds are normal. She exhibits no distension and no mass. There is no tenderness. There is no rebound and no guarding. Hernia confirmed negative in the right inguinal area and confirmed negative in the left inguinal area.  Genitourinary: Rectum normal, vagina normal and uterus normal. No breast  swelling, tenderness, discharge or bleeding. Pelvic exam was performed with patient supine. There is no rash, tenderness, lesion or injury on the right labia. There is no rash, tenderness, lesion or injury on the left labia. Cervix exhibits no motion tenderness, no discharge and no friability. Right adnexum displays no mass, no tenderness and no fullness. Left adnexum displays no mass, no tenderness and no fullness. No erythema, tenderness or bleeding in the vagina. No signs of injury around the vagina. No vaginal discharge found.  Genitourinary Comments: Rectocele noted  Musculoskeletal: Normal range of motion. She exhibits no edema or tenderness.  Lymphadenopathy:    She has no cervical adenopathy.       Right: No inguinal adenopathy present.       Left: No inguinal adenopathy present.  Neurological: She is alert and oriented to person, place, and time. She has normal reflexes. No cranial nerve deficit. Coordination normal.  Skin: Skin is warm and dry. No rash noted. She is not diaphoretic.  Psychiatric: She has a  normal mood and affect. Her behavior is normal. Judgment and thought content normal.  Vitals reviewed.    Depression Screen PHQ 2/9 Scores 06/24/2016 05/13/2015  PHQ - 2 Score 1 -  PHQ- 9 Score 2 -  Exception Documentation - Patient refusal      Assessment & Plan:     Routine Health Maintenance and Physical Exam  Exercise Activities and Dietary recommendations Goals    . Exercise 150 minutes per week (moderate activity)       Immunization History  Administered Date(s) Administered  . Influenza,inj,Quad PF,36+ Mos 05/13/2015  . Tdap 12/07/2007    Health Maintenance  Topic Date Due  . Hepatitis C Screening  March 25, 1956  . PNEUMOCOCCAL POLYSACCHARIDE VACCINE (1) 08/07/1959  . FOOT EXAM  08/07/1967  . OPHTHALMOLOGY EXAM  08/07/1967  . HIV Screening  08/06/1972  . PAP SMEAR  04/01/2015  . HEMOGLOBIN A1C  11/10/2015  . INFLUENZA VACCINE  11/25/2015  . MAMMOGRAM  05/12/2017  . TETANUS/TDAP  12/06/2017  . COLONOSCOPY  06/15/2021     Discussed health benefits of physical activity, and encouraged her to engage in regular exercise appropriate for her age and condition.   1. Annual physical exam Normal physical exam today. Will check labs as below and f/u pending lab results. If labs are stable and WNL she will not need to have these rechecked for one year at her next annual physical exam. She is to call the office in the meantime if she has any acute issue, questions or concerns. - CBC with Differential - Comprehensive metabolic panel  2. Cervical cancer screening Pap collected today. Will send as below and f/u pending results. - Pap IG and HPV (high risk) DNA detection ([facility])  3. Breast cancer screening Breast exam today was normal. There is no family history of breast cancer. She does perform regular self breast exams. She uses [facility] so patient was advised to call so she may schedule her mammogram at her convenience.  4. Colon cancer  screening Due for colonoscopy. Previously done by Dr. Candace Cruise. Referral placed for Dr. Vicente Males as below.  - Ambulatory referral to Gastroenterology  5. Essential hypertension Stable. Continue lisinopril-hctz 20-25mg  once daily and metoprolol 25mg  daily. Will check labs as below and f/u pending results. - Comprehensive metabolic panel  6. Adult hypothyroidism Stable. Continue levothyroxine 192mcg. Will check labs as below and f/u pending results. - TSH  7. Diabetes mellitus without complication (HCC) Stable. Diet  controlled. Will check labs as below and f/u pending results. - Comprehensive metabolic panel - Hemoglobin A1c  8. Hypercholesteremia Stable. Continue simvastatin 10mg  daily. Will check labs as below and f/u pending results. - Comprehensive metabolic panel - Lipid panel  9. Need for hepatitis C screening test - Hepatitis C antibody screen  --------------------------------------------------------------------    Mar Daring, [name]  [facility]

## 2016-06-29 DIAGNOSIS — R87612 Low grade squamous intraepithelial lesion on cytologic smear of cervix (LGSIL): Secondary | ICD-10-CM | POA: Insufficient documentation

## 2016-06-29 DIAGNOSIS — R8789 Other abnormal findings in specimens from female genital organs: Secondary | ICD-10-CM | POA: Insufficient documentation

## 2016-06-29 DIAGNOSIS — R87618 Other abnormal cytological findings on specimens from cervix uteri: Secondary | ICD-10-CM | POA: Insufficient documentation

## 2016-06-29 LAB — PAP IG AND HPV HIGH-RISK
HPV, HIGH-RISK: POSITIVE — AB
PAP Smear Comment: 0

## 2016-06-29 LAB — HM MAMMOGRAPHY

## 2016-06-29 NOTE — Addendum Note (Signed)
Addended by: Mar Daring on: 06/29/2016 04:55 PM   Modules accepted: Orders

## 2016-06-30 ENCOUNTER — Other Ambulatory Visit: Payer: Self-pay

## 2016-06-30 ENCOUNTER — Telehealth: Payer: Self-pay

## 2016-06-30 DIAGNOSIS — Z86010 Personal history of colonic polyps: Secondary | ICD-10-CM

## 2016-06-30 NOTE — Telephone Encounter (Signed)
Gastroenterology Pre-Procedure Review  Request Date: 07/20/16  Requesting Physician: Dr. Vicente Males  PATIENT REVIEW QUESTIONS: The patient responded to the following health history questions as indicated:    1. Are you having any GI issues? no 2. Do you have a personal history of Polyps? yes (removed) 3. Do you have a family history of Colon Cancer or Polyps? no 4. Diabetes Mellitus? no 5. Joint replacements in the past 12 months?no 6. Major health problems in the past 3 months?No 7. Any artificial heart valves, MVP, or defibrillator?no    MEDICATIONS & ALLERGIES:    Patient reports the following regarding taking any anticoagulation/antiplatelet therapy:   Plavix, Coumadin, Eliquis, Xarelto, Lovenox, Pradaxa, Brilinta, or Effient? no Aspirin? yes (81mg )  Patient confirms/reports the following medications:  Current Outpatient Prescriptions  Medication Sig Dispense Refill  . acetaminophen (TYLENOL) 500 MG tablet Take 1,000 mg by mouth every 6 (six) hours as needed for mild pain.    Marland Kitchen aspirin 81 MG tablet Take 81 mg by mouth every evening.     Marland Kitchen levothyroxine (SYNTHROID, LEVOTHROID) 150 MCG tablet TAKE 1 TABLET BY MOUTH DAILY 30 tablet 0  . lisinopril-hydrochlorothiazide (PRINZIDE,ZESTORETIC) 20-25 MG tablet TAKE 1 TABLET BY MOUTH DAILY 30 tablet 0  . LORazepam (ATIVAN) 1 MG tablet Take 1 tablet (1 mg total) by mouth daily as needed for anxiety. 90 tablet 3  . metoprolol succinate (TOPROL-XL) 25 MG 24 hr tablet TAKE 1 TABLET BY MOUTH DAILY 30 tablet 0  . Multiple Vitamin (MULTI-VITAMINS) TABS Take 2 tablets by mouth daily.     . naproxen sodium (ANAPROX) 220 MG tablet Take 440 mg by mouth daily as needed (pain).    . simvastatin (ZOCOR) 10 MG tablet TAKE 1 TABLET BY MOUTH AT BEDTIME 30 tablet 0  . venlafaxine XR (EFFEXOR-XR) 150 MG 24 hr capsule TAKE 1 CAPSULE BY MOUTH ONCE DAILY 30 capsule 0   No current facility-administered medications for this visit.     Patient confirms/reports the  following allergies:  No Known Allergies  No orders of the defined types were placed in this encounter.   AUTHORIZATION INFORMATION Primary Insurance: 1D#: Group #:  Secondary Insurance: 1D#: Group #:  SCHEDULE INFORMATION: Date: 07/20/16 Time: Location: [place]

## 2016-06-30 NOTE — Telephone Encounter (Signed)
Patient advised as below. Patient verbalizes understanding and is in agreement with treatment plan.  

## 2016-06-30 NOTE — Telephone Encounter (Signed)
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 Referral to GYN necessary for further treatment. Referral placed today 06/29/16.

## 2016-07-01 ENCOUNTER — Telehealth: Payer: Self-pay

## 2016-07-01 LAB — CBC WITH DIFFERENTIAL/PLATELET
BASOS ABS: 0 10*3/uL (ref 0.0–0.2)
Basos: 1 %
EOS (ABSOLUTE): 0.1 10*3/uL (ref 0.0–0.4)
Eos: 1 %
HEMOGLOBIN: 15.9 g/dL (ref 11.1–15.9)
Hematocrit: 46.4 % (ref 34.0–46.6)
Immature Grans (Abs): 0 10*3/uL (ref 0.0–0.1)
Immature Granulocytes: 0 %
LYMPHS ABS: 1.9 10*3/uL (ref 0.7–3.1)
Lymphs: 26 %
MCH: 31.5 pg (ref 26.6–33.0)
MCHC: 34.3 g/dL (ref 31.5–35.7)
MCV: 92 fL (ref 79–97)
Monocytes Absolute: 0.5 10*3/uL (ref 0.1–0.9)
Monocytes: 6 %
NEUTROS ABS: 4.9 10*3/uL (ref 1.4–7.0)
Neutrophils: 66 %
PLATELETS: 173 10*3/uL (ref 150–379)
RBC: 5.04 x10E6/uL (ref 3.77–5.28)
RDW: 13.2 % (ref 12.3–15.4)
WBC: 7.5 10*3/uL (ref 3.4–10.8)

## 2016-07-01 LAB — COMPREHENSIVE METABOLIC PANEL
ALBUMIN: 4.5 g/dL (ref 3.5–5.5)
ALK PHOS: 75 IU/L (ref 39–117)
ALT: 17 IU/L (ref 0–32)
AST: 20 IU/L (ref 0–40)
Albumin/Globulin Ratio: 1.7 (ref 1.2–2.2)
BILIRUBIN TOTAL: 0.9 mg/dL (ref 0.0–1.2)
BUN / CREAT RATIO: 23 (ref 9–23)
BUN: 19 mg/dL (ref 6–24)
CHLORIDE: 95 mmol/L — AB (ref 96–106)
CO2: 25 mmol/L (ref 18–29)
Calcium: 9.8 mg/dL (ref 8.7–10.2)
Creatinine, Ser: 0.83 mg/dL (ref 0.57–1.00)
GFR calc Af Amer: 90 mL/min/{1.73_m2} (ref >59)
GFR calc non Af Amer: 78 mL/min/{1.73_m2} (ref >59)
GLUCOSE: 121 mg/dL — AB (ref 65–99)
Globulin, Total: 2.7 g/dL (ref 1.5–4.5)
Potassium: 4.1 mmol/L (ref 3.5–5.2)
Sodium: 138 mmol/L (ref 134–144)
Total Protein: 7.2 g/dL (ref 6.0–8.5)

## 2016-07-01 LAB — LIPID PANEL
CHOL/HDL RATIO: 4.7 ratio — AB (ref 0.0–4.4)
Cholesterol, Total: 183 mg/dL (ref 100–199)
HDL: 39 mg/dL — ABNORMAL LOW (ref >39)
LDL Calculated: 96 mg/dL (ref 0–99)
TRIGLYCERIDES: 239 mg/dL — AB (ref 0–149)
VLDL Cholesterol Cal: 48 mg/dL — ABNORMAL HIGH (ref 5–40)

## 2016-07-01 LAB — HEMOGLOBIN A1C
Est. average glucose Bld gHb Est-mCnc: 126 mg/dL
HEMOGLOBIN A1C: 6 % — AB (ref 4.8–5.6)

## 2016-07-01 LAB — HEPATITIS C ANTIBODY

## 2016-07-01 LAB — TSH: TSH: 1.8 u[IU]/mL (ref 0.450–4.500)

## 2016-07-01 NOTE — Telephone Encounter (Signed)
Left message to call back  

## 2016-07-01 NOTE — Telephone Encounter (Signed)
Advised patient as below. Patient reports that she will increase simvastatin to 20mg  daily.

## 2016-07-01 NOTE — Telephone Encounter (Signed)
-----   Message from Mar Daring, [name] sent at 07/01/2016  2:01 PM EST ----- Cholesterol is up from last year and triglycerides are not as well controlled as last year. This is most closely related with diet. I would recommend increasing simvastatin if you are taking it to 20mg  for better control. A1c is improved to 6.0 from 6.2. All other labs are WNL and stable.

## 2016-07-02 ENCOUNTER — Telehealth: Payer: Self-pay

## 2016-07-02 NOTE — Telephone Encounter (Signed)
Called home number no answer. Called Cell number and LM with Mr.Hargadon for patient to return call.  Note: Mammogram Results Normal. Repeat in 1 year.  Thanks,  -Denys Labree

## 2016-07-06 ENCOUNTER — Encounter: Payer: Self-pay | Admitting: Physician Assistant

## 2016-07-06 ENCOUNTER — Ambulatory Visit (INDEPENDENT_AMBULATORY_CARE_PROVIDER_SITE_OTHER): Payer: BLUE CROSS/BLUE SHIELD | Admitting: Obstetrics and Gynecology

## 2016-07-06 ENCOUNTER — Encounter: Payer: Self-pay | Admitting: Obstetrics and Gynecology

## 2016-07-06 VITALS — BP 147/66 | HR 82 | Ht 64.0 in | Wt 205.0 lb

## 2016-07-06 DIAGNOSIS — Z720 Tobacco use: Secondary | ICD-10-CM

## 2016-07-06 DIAGNOSIS — R87612 Low grade squamous intraepithelial lesion on cytologic smear of cervix (LGSIL): Secondary | ICD-10-CM | POA: Diagnosis not present

## 2016-07-06 NOTE — Progress Notes (Signed)
    GYNECOLOGY CLINIC COLPOSCOPY PROCEDURE NOTE  61 y.o. [id number] here for colposcopy for low-grade squamous intraepithelial neoplasia (LGSIL - encompassing HPV,mild dysplasia,CIN I) pap smear on 06/24/2016. Discussed role for HPV in cervical dysplasia, need for surveillance.  Patient gave informed consent.  Placed in lithotomy position. Cervix viewed with speculum and colposcope after application of acetic acid.   Colposcopy adequate? Yes  acetowhite lesion(s) noted at 11 and 2 o'clock; corresponding biopsies obtained.  ECC specimen obtained.  Also a nabothian cyst was present at 5 o'clock along ectocervical canal.  All specimens were labeled and sent to pathology.  Patient was given post procedure instructions.  Will follow up pathology and manage accordingly.  Routine preventative health maintenance measures emphasized.  Strongly encouraged patient to discontinue tobacco use (currently 39 year pack history).      Rubie Maid, MD [facility]

## 2016-07-06 NOTE — Telephone Encounter (Signed)
LMTCB  Thanks,  -Laney Bagshaw 

## 2016-07-06 NOTE — Telephone Encounter (Signed)
Advised patient of results.  

## 2016-07-08 LAB — PATHOLOGY

## 2016-07-09 ENCOUNTER — Telehealth: Payer: Self-pay

## 2016-07-09 ENCOUNTER — Other Ambulatory Visit: Payer: Self-pay | Admitting: Physician Assistant

## 2016-07-09 DIAGNOSIS — E039 Hypothyroidism, unspecified: Secondary | ICD-10-CM

## 2016-07-09 DIAGNOSIS — F419 Anxiety disorder, unspecified: Secondary | ICD-10-CM

## 2016-07-09 DIAGNOSIS — I10 Essential (primary) hypertension: Secondary | ICD-10-CM

## 2016-07-09 DIAGNOSIS — E785 Hyperlipidemia, unspecified: Secondary | ICD-10-CM

## 2016-07-09 NOTE — Telephone Encounter (Signed)
Called pt informed her of information below. Pt gave verbal understanding.  

## 2016-07-09 NOTE — Telephone Encounter (Signed)
-----   Message from Rubie Maid, MD sent at 07/08/2016  1:53 PM EDT ----- Please inform patient of low grade biopsy results (CIN-I).  Will just need to follow up with a pap smear next year.

## 2016-07-20 ENCOUNTER — Ambulatory Visit
Admission: RE | Admit: 2016-07-20 | Discharge: 2016-07-20 | Disposition: A | Discharge status: Home / Self Care | Payer: BLUE CROSS/BLUE SHIELD | Source: Ambulatory Visit | Attending: Gastroenterology | Admitting: Gastroenterology

## 2016-07-20 ENCOUNTER — Encounter: Payer: Self-pay | Admitting: Anesthesiology

## 2016-07-20 ENCOUNTER — Ambulatory Visit: Payer: BLUE CROSS/BLUE SHIELD | Admitting: Anesthesiology

## 2016-07-20 ENCOUNTER — Encounter
Admission: RE | Disposition: A | Discharge status: Home / Self Care | Payer: Self-pay | Source: Ambulatory Visit | Attending: Gastroenterology

## 2016-07-20 DIAGNOSIS — F419 Anxiety disorder, unspecified: Secondary | ICD-10-CM | POA: Diagnosis not present

## 2016-07-20 DIAGNOSIS — I10 Essential (primary) hypertension: Secondary | ICD-10-CM | POA: Insufficient documentation

## 2016-07-20 DIAGNOSIS — E039 Hypothyroidism, unspecified: Secondary | ICD-10-CM | POA: Diagnosis not present

## 2016-07-20 DIAGNOSIS — F329 Major depressive disorder, single episode, unspecified: Secondary | ICD-10-CM | POA: Diagnosis not present

## 2016-07-20 DIAGNOSIS — F17210 Nicotine dependence, cigarettes, uncomplicated: Secondary | ICD-10-CM | POA: Insufficient documentation

## 2016-07-20 DIAGNOSIS — Z79899 Other long term (current) drug therapy: Secondary | ICD-10-CM | POA: Diagnosis not present

## 2016-07-20 DIAGNOSIS — Z7982 Long term (current) use of aspirin: Secondary | ICD-10-CM | POA: Insufficient documentation

## 2016-07-20 DIAGNOSIS — Z86010 Personal history of colonic polyps: Secondary | ICD-10-CM

## 2016-07-20 DIAGNOSIS — D125 Benign neoplasm of sigmoid colon: Secondary | ICD-10-CM | POA: Diagnosis not present

## 2016-07-20 DIAGNOSIS — Z8719 Personal history of other diseases of the digestive system: Secondary | ICD-10-CM | POA: Insufficient documentation

## 2016-07-20 DIAGNOSIS — Z1211 Encounter for screening for malignant neoplasm of colon: Secondary | ICD-10-CM | POA: Insufficient documentation

## 2016-07-20 DIAGNOSIS — K5730 Diverticulosis of large intestine without perforation or abscess without bleeding: Secondary | ICD-10-CM | POA: Diagnosis not present

## 2016-07-20 DIAGNOSIS — K621 Rectal polyp: Secondary | ICD-10-CM | POA: Diagnosis not present

## 2016-07-20 DIAGNOSIS — E785 Hyperlipidemia, unspecified: Secondary | ICD-10-CM | POA: Insufficient documentation

## 2016-07-20 DIAGNOSIS — E119 Type 2 diabetes mellitus without complications: Secondary | ICD-10-CM | POA: Diagnosis not present

## 2016-07-20 HISTORY — PX: COLONOSCOPY WITH PROPOFOL: SHX5780

## 2016-07-20 SURGERY — COLONOSCOPY WITH PROPOFOL
Anesthesia: General

## 2016-07-20 MED ORDER — SODIUM CHLORIDE 0.9 % IV SOLN
INTRAVENOUS | Status: DC
  Administered 2016-07-20: 1000 mL via INTRAVENOUS

## 2016-07-20 MED ORDER — PROPOFOL 10 MG/ML IV BOLUS
INTRAVENOUS | Status: DC | PRN
  Administered 2016-07-20: 50 mg via INTRAVENOUS

## 2016-07-20 MED ORDER — GLYCOPYRROLATE 0.2 MG/ML IJ SOLN
INTRAMUSCULAR | Status: DC | PRN
  Administered 2016-07-20: 0.2 mg via INTRAVENOUS

## 2016-07-20 MED ORDER — PHENYLEPHRINE HCL 10 MG/ML IJ SOLN
INTRAMUSCULAR | Status: DC | PRN
  Administered 2016-07-20: 50 ug via INTRAVENOUS
  Administered 2016-07-20: 100 ug via INTRAVENOUS

## 2016-07-20 MED ORDER — LIDOCAINE 2% (20 MG/ML) 5 ML SYRINGE
INTRAMUSCULAR | Status: DC | PRN
  Administered 2016-07-20: 50 mg via INTRAVENOUS

## 2016-07-20 MED ORDER — PROPOFOL 500 MG/50ML IV EMUL
INTRAVENOUS | Status: DC | PRN
Start: 2016-07-20 — End: 2016-07-20
  Administered 2016-07-20: 150 ug/kg/min via INTRAVENOUS

## 2016-07-20 NOTE — Op Note (Addendum)
[facility] Patient Name: Regina Pierce Procedure Date: 07/20/2016 9:02 AM MRN: [id number] Account #: [id number] Date of Birth: [birth year] Admit Type: Outpatient Age: 61 Room: [facility] ENDO ROOM 1 Gender: Female Note Status: Finalized Procedure:            Colonoscopy Indications:          High risk colon cancer surveillance: Personal history                        of colonic polyps, Last colonoscopy: February 2013 Providers:            Jonathon Bellows MD, MD Referring MD:         Mar Daring (Referring MD) Medicines:            Monitored Anesthesia Care Complications:        No immediate complications. Procedure:            Pre-Anesthesia Assessment:                       - Prior to the procedure, a History and Physical was                        performed, and patient medications, allergies and                        sensitivities were reviewed. The patient's tolerance of                        previous anesthesia was reviewed.                       - The risks and benefits of the procedure and the                        sedation options and risks were discussed with the                        patient. All questions were answered and informed                        consent was obtained.                       - ASA Grade Assessment: III - A patient with severe                        systemic disease.                       After obtaining informed consent, the colonoscope was                        passed under direct vision. Throughout the procedure,                        the patient's blood pressure, pulse, and oxygen                        saturations were monitored continuously. The Olympus  CF-HQ190L Colonoscope (S#. [id number]) was introduced                        through the anus and advanced to the the cecum,                        identified by the appendiceal orifice, IC valve and   transillumination. The colonoscopy was performed with                        ease. The patient tolerated the procedure well. The                        quality of the bowel preparation was adequate to                        identify polyps. Findings:      The perianal and digital rectal examinations were normal.      A 5 mm polyp was found in the sigmoid colon. The polyp was sessile. The       polyp was removed with a cold biopsy forceps. Resection and retrieval       were complete.      Two sessile polyps were found in the rectum. The polyps were 6 to 7 mm       in size. These polyps were removed with a cold snare. Resection and       retrieval were complete.      A 7 mm polyp was found in the rectum. The polyp was sessile. The polyp       was removed with a hot snare. Resection and retrieval were complete.      A few small-mouthed diverticula were found in the sigmoid colon.      The exam was otherwise without abnormality on direct and retroflexion       views. Impression:           - One 5 mm polyp in the sigmoid colon, removed with a                        cold biopsy forceps. Resected and retrieved.                       - Two 6 to 7 mm polyps in the rectum, removed with a                        cold snare. Resected and retrieved.                       - One 7 mm polyp in the rectum, removed with a hot                        snare. Resected and retrieved.                       - Diverticulosis in the sigmoid colon.                       - The examination was otherwise normal on direct and  retroflexion views. Recommendation:       - Discharge patient to home (with escort).                       - Resume previous diet.                       - Continue present medications.                       - Await pathology results.                       - Repeat colonoscopy in 3 - 5 years for surveillance                        based on pathology results. Procedure  Code(s):    --- Professional ---                       [id number], Colonoscopy, flexible; with removal of tumor(s),                        polyp(s), or other lesion(s) by snare technique                       45380, 49, Colonoscopy, flexible; with biopsy, single                        or multiple Diagnosis Code(s):    --- Professional ---                       Z86.010, Personal history of colonic polyps                       D12.5, Benign neoplasm of sigmoid colon                       K62.1, Rectal polyp                       K57.30, Diverticulosis of large intestine without                        perforation or abscess without bleeding CPT copyright 2016 American Medical Association. All rights reserved. The codes documented in this report are preliminary and upon coder review may  be revised to meet current compliance requirements. Jonathon Bellows, MD Jonathon Bellows MD, MD 07/20/2016 9:35:56 AM This report has been signed electronically. Number of Addenda: 0 Note Initiated On: 07/20/2016 9:02 AM Scope Withdrawal Time: 0 hours 19 minutes 32 seconds  Total Procedure Duration: 0 hours 24 minutes 12 seconds       [facility]

## 2016-07-20 NOTE — Anesthesia Postprocedure Evaluation (Signed)
Anesthesia Post Note  Patient: Regina Pierce  Procedure(s) Performed: Procedure(s) (LRB): COLONOSCOPY WITH PROPOFOL (N/A)  Patient location during evaluation: Endoscopy Anesthesia Type: General Level of consciousness: awake and alert Pain management: pain level controlled Vital Signs Assessment: post-procedure vital signs reviewed and stable Respiratory status: spontaneous breathing, nonlabored ventilation, respiratory function stable and patient connected to nasal cannula oxygen Cardiovascular status: blood pressure returned to baseline and stable Postop Assessment: no signs of nausea or vomiting Anesthetic complications: no     Last Vitals:  Vitals:   07/20/16 1002 07/20/16 1012  BP: 133/65 125/60  Pulse: 71 64  Resp: 15 13  Temp:      Last Pain:  Vitals:   07/20/16 1012  TempSrc:   PainSc: 0-No pain                 Martha Clan

## 2016-07-20 NOTE — H&P (Signed)
Jonathon Bellows MD [address] [place], [place] [address] Phone: [phone] Fax : [phone]  Primary Care Physician:  Mar Daring, [name] Primary Gastroenterologist:  Dr. Jonathon Bellows   Pre-Procedure History & Physical: HPI:  Regina Pierce is a 61 y.o. female is here for an colonoscopy.   Past Medical History:  Diagnosis Date  . Anxiety   . Depression   . Diabetes mellitus without complication (HCC)    no meds  . Hyperlipidemia   . Hypertension   . Thyroid disease     Past Surgical History:  Procedure Laterality Date  . ACHILLES TENDON SURGERY Left 05/12/2016   Procedure: ACHILLES TENDON REPAIR;  Surgeon: Samara Deist, DPM;  Location: [facility] ORS;  Service: Podiatry;  Laterality: Left;  . BONE EXOSTOSIS EXCISION Left 05/12/2016   Procedure: CALCANEAL EXOSTECTOMY AND DORSAL EXOSTECTOMY;  Surgeon: Samara Deist, DPM;  Location: [facility] ORS;  Service: Podiatry;  Laterality: Left;  . HAND SURGERY Bilateral 1999   carpal tunnel   . KNEE SURGERY  2010  . TUBAL LIGATION  1987    Prior to Admission medications   Medication Sig Start Date End Date Taking? Authorizing Provider  acetaminophen (TYLENOL) 500 MG tablet Take 1,000 mg by mouth every 6 (six) hours as needed for mild pain.    Historical Provider, MD  aspirin 81 MG tablet Take 81 mg by mouth every evening.     Historical Provider, MD  levothyroxine (SYNTHROID, LEVOTHROID) 150 MCG tablet TAKE 1 TABLET BY MOUTH ONCE DAILY 07/09/16   Mar Daring, [name]  lisinopril-hydrochlorothiazide (PRINZIDE,ZESTORETIC) 20-25 MG tablet TAKE 1 TABLET BY MOUTH ONCE DAILY 07/09/16   Mar Daring, [name]  LORazepam (ATIVAN) 1 MG tablet Take 1 tablet (1 mg total) by mouth daily as needed for anxiety. 06/16/16   Mar Daring, [name]  metoprolol succinate (TOPROL-XL) 25 MG 24 hr tablet TAKE 1 TABLET BY MOUTH ONCE DAILY 07/09/16   Mar Daring, [name]  Multiple Vitamin (MULTI-VITAMINS) TABS Take 2 tablets by mouth daily.      Historical Provider, MD  naproxen sodium (ANAPROX) 220 MG tablet Take 440 mg by mouth daily as needed (pain).    Historical Provider, MD  simvastatin (ZOCOR) 10 MG tablet TAKE 1 TABLET BY MOUTH AT BEDTIME 07/09/16   Mar Daring, [name]  venlafaxine XR (EFFEXOR-XR) 150 MG 24 hr capsule TAKE 1 CAPSULE BY MOUTH ONCE DAILY 07/09/16   Mar Daring, [name]    Allergies as of 06/30/2016  . (No Known Allergies)    Family History  Problem Relation Age of Onset  . Hypertension Sister     Social History   Social History  . Marital status: Married    Spouse name: N/A  . Number of children: N/A  . Years of education: N/A   Occupational History  . Not on file.   Social History Main Topics  . Smoking status: Current Every Day Smoker    Packs/day: 1.00    Years: 30.00    Types: Cigarettes  . Smokeless tobacco: Never Used  . Alcohol use No  . Drug use: No  . Sexual activity: Not Currently    Birth control/ protection: Post-menopausal   Other Topics Concern  . Not on file   Social History Narrative  . No narrative on file    Review of Systems: See HPI, otherwise negative ROS  Physical Exam: LMP 04/27/2007  General:   Alert,  pleasant and cooperative in NAD Head:  Normocephalic and atraumatic.  Neck:  Supple; no masses or thyromegaly. Lungs:  Clear throughout to auscultation.    Heart:  Regular rate and rhythm. Abdomen:  Soft, nontender and nondistended. Normal bowel sounds, without guarding, and without rebound.   Neurologic:  Alert and  oriented x4;  grossly normal neurologically.  Impression/Plan: Regina Pierce is here for an colonoscopy to be performed for surveillance due to prior history of colon polyps  Risks, benefits, limitations, and alternatives regarding  colonoscopy have been reviewed with the patient.  Questions have been answered.  All parties agreeable.   Jonathon Bellows, MD  07/20/2016, 8:11 AM

## 2016-07-20 NOTE — Anesthesia Preprocedure Evaluation (Addendum)
Anesthesia Evaluation  Patient identified by MRN, date of birth, ID band Patient awake    Reviewed: Allergy & Precautions, NPO status , Patient's Chart, lab work & pertinent test results, reviewed documented beta blocker date and time   History of Anesthesia Complications Negative for: history of anesthetic complications  Airway Mallampati: III  TM Distance: >3 FB     Dental  (+) Chipped, Missing   Pulmonary neg shortness of breath, neg COPD, neg recent URI, Current Smoker,           Cardiovascular Exercise Tolerance: Good hypertension, Pt. on medications and Pt. on home beta blockers (-) angina(-) CAD, (-) Past MI, (-) Cardiac Stents and (-) CABG (-) dysrhythmias (-) Valvular Problems/Murmurs     Neuro/Psych neg Seizures PSYCHIATRIC DISORDERS  Neuromuscular disease    GI/Hepatic Neg liver ROS, hiatal hernia,   Endo/Other  diabetes, Type 2Hypothyroidism   Renal/GU negative Renal ROS  negative genitourinary   Musculoskeletal   Abdominal   Peds  (+) Neurological problem Hematology negative hematology ROS (+)   Anesthesia Other Findings Obese. IRBBB. No cardiac symptoms.  Reproductive/Obstetrics                             Anesthesia Physical  Anesthesia Plan  ASA: III  Anesthesia Plan: General   Post-op Pain Management:    Induction: Intravenous  Airway Management Planned:   Additional Equipment:   Intra-op Plan:   Post-operative Plan:   Informed Consent: I have reviewed the patients History and Physical, chart, labs and discussed the procedure including the risks, benefits and alternatives for the proposed anesthesia with the patient or authorized representative who has indicated his/her understanding and acceptance.     Plan Discussed with: CRNA  Anesthesia Plan Comments:        Anesthesia Quick Evaluation

## 2016-07-20 NOTE — Anesthesia Post-op Follow-up Note (Cosign Needed)
Anesthesia QCDR form completed.        

## 2016-07-20 NOTE — Transfer of Care (Signed)
Immediate Anesthesia Transfer of Care Note  Patient: Regina Pierce  Procedure(s) Performed: Procedure(s): COLONOSCOPY WITH PROPOFOL (N/A)  Patient Location: Endoscopy Unit  Anesthesia Type:General  Level of Consciousness: awake, alert  and oriented  Airway & Oxygen Therapy: Patient connected to nasal cannula oxygen  Post-op Assessment: Post -op Vital signs reviewed and stable  Post vital signs: stable  Last Vitals:  Vitals:   07/20/16 0818 07/20/16 0942  BP: (!) 154/80 (!) 90/47  Pulse: 88 78  Resp: 16 14  Temp: 37.2 C 36.3 C    Last Pain:  Vitals:   07/20/16 0942  TempSrc: Oral         Complications: No apparent anesthesia complications

## 2016-07-21 LAB — SURGICAL PATHOLOGY

## 2016-07-22 ENCOUNTER — Encounter: Payer: Self-pay | Admitting: Gastroenterology

## 2016-07-26 ENCOUNTER — Encounter: Payer: Self-pay | Admitting: Gastroenterology

## 2016-09-07 ENCOUNTER — Telehealth: Payer: Self-pay | Admitting: Physician Assistant

## 2016-09-07 DIAGNOSIS — E039 Hypothyroidism, unspecified: Secondary | ICD-10-CM

## 2016-09-07 DIAGNOSIS — F419 Anxiety disorder, unspecified: Secondary | ICD-10-CM

## 2016-09-07 DIAGNOSIS — E7800 Pure hypercholesterolemia, unspecified: Secondary | ICD-10-CM

## 2016-09-07 DIAGNOSIS — I10 Essential (primary) hypertension: Secondary | ICD-10-CM

## 2016-09-07 MED ORDER — LISINOPRIL-HYDROCHLOROTHIAZIDE 20-25 MG PO TABS: 1.0000 | ORAL_TABLET | Freq: Every day | ORAL | 1 refills | Status: DC

## 2016-09-07 MED ORDER — METOPROLOL SUCCINATE ER 25 MG PO TB24: 25.0000 mg | ORAL_TABLET | Freq: Every day | ORAL | 1 refills | Status: DC

## 2016-09-07 MED ORDER — LORAZEPAM 1 MG PO TABS: 1.0000 mg | ORAL_TABLET | Freq: Two times a day (BID) | ORAL | 1 refills | Status: DC | PRN

## 2016-09-07 MED ORDER — VENLAFAXINE HCL ER 150 MG PO CP24: 150.0000 mg | ORAL_CAPSULE | Freq: Every day | ORAL | 1 refills | Status: DC

## 2016-09-07 MED ORDER — SIMVASTATIN 10 MG PO TABS: 10.0000 mg | ORAL_TABLET | Freq: Every day | ORAL | 1 refills | Status: DC

## 2016-09-07 MED ORDER — LEVOTHYROXINE SODIUM 150 MCG PO TABS: 150.0000 ug | ORAL_TABLET | Freq: Every day | ORAL | 1 refills | Status: DC

## 2016-09-07 NOTE — Telephone Encounter (Signed)
rx called into [facility]

## 2016-09-07 NOTE — Telephone Encounter (Signed)
All meds refilled.  Ok to phone in Lorazepam 1mg  tab Take 1 tab PO BID prn anxiety #180 1 refill

## 2016-09-07 NOTE — Telephone Encounter (Signed)
Pt needs refill on all her medications 90 day supply.  She uses [facility].  She ask on the [facility] that she can get 60 tab.  Pt's call back is [phone]  Thank sTeri

## 2016-10-28 ENCOUNTER — Ambulatory Visit (INDEPENDENT_AMBULATORY_CARE_PROVIDER_SITE_OTHER): Payer: Self-pay | Admitting: Physician Assistant

## 2016-10-28 ENCOUNTER — Encounter: Payer: Self-pay | Admitting: Physician Assistant

## 2016-10-28 VITALS — BP 160/86 | HR 108 | Temp 98.3°F | Resp 16 | Ht 64.5 in | Wt 196.0 lb

## 2016-10-28 DIAGNOSIS — F321 Major depressive disorder, single episode, moderate: Secondary | ICD-10-CM

## 2016-10-28 MED ORDER — VENLAFAXINE HCL ER 225 MG PO TB24
1.0000 | ORAL_TABLET | Freq: Every day | ORAL | 0 refills | Status: DC
Start: 2016-10-28 — End: 2016-12-06

## 2016-10-28 NOTE — Progress Notes (Signed)
Patient: Regina Pierce Female    DOB: 1956-01-04   61 y.o.   MRN: [id number] Visit Date: 10/28/2016  Today's Provider: Mar Daring, [name]   Chief Complaint  Patient presents with  . Depression   Subjective:    HPI  Depression, Follow-up  She  was last seen for this several months ago. Changes made at last visit include no changes.   She reports good compliance with treatment. She is not having side effects.   She reports good tolerance of treatment. Current symptoms include: depressed mood, difficulty concentrating, fatigue, hopelessness and hypersomnia and crying all the time, sweats and feeling very "jittery" in the mornings. She feels she is Worse since last visit. Patient reports symptoms have been worse since May of this year. Patient reports that she has been out of work since since January, 2018 due to multiple health problems.  ------------------------------------------------------------------------    No Known Allergies   Current Outpatient Prescriptions:  .  acetaminophen (TYLENOL) 500 MG tablet, Take 1,000 mg by mouth every 6 (six) hours as needed for mild pain., Disp: , Rfl:  .  aspirin 81 MG tablet, Take 81 mg by mouth every evening. , Disp: , Rfl:  .  levothyroxine (SYNTHROID, LEVOTHROID) 150 MCG tablet, Take 1 tablet (150 mcg total) by mouth daily., Disp: 90 tablet, Rfl: 1 .  lisinopril-hydrochlorothiazide (PRINZIDE,ZESTORETIC) 20-25 MG tablet, Take 1 tablet by mouth daily., Disp: 90 tablet, Rfl: 1 .  LORazepam (ATIVAN) 1 MG tablet, Take 1 tablet (1 mg total) by mouth 2 (two) times daily as needed for anxiety., Disp: 180 tablet, Rfl: 1 .  metoprolol succinate (TOPROL-XL) 25 MG 24 hr tablet, Take 1 tablet (25 mg total) by mouth daily., Disp: 90 tablet, Rfl: 1 .  naproxen sodium (ANAPROX) 220 MG tablet, Take 440 mg by mouth daily as needed (pain)., Disp: , Rfl:  .  simvastatin (ZOCOR) 10 MG tablet, Take 1 tablet (10 mg total) by mouth at bedtime.,  Disp: 90 tablet, Rfl: 1 .  venlafaxine XR (EFFEXOR-XR) 150 MG 24 hr capsule, Take 1 capsule (150 mg total) by mouth daily., Disp: 90 capsule, Rfl: 1  Review of Systems  Constitutional: Positive for activity change, appetite change, diaphoresis and fatigue.  Respiratory: Negative.   Cardiovascular: Negative.   Psychiatric/Behavioral: Positive for decreased concentration and sleep disturbance. Negative for self-injury and suicidal ideas. The patient is nervous/anxious.     Social History  Substance Use Topics  . Smoking status: Current Every Day Smoker    Packs/day: 1.00    Years: 30.00    Types: Cigarettes  . Smokeless tobacco: Never Used  . Alcohol use No   Objective:   BP (!) 160/86 (BP Location: Left Arm, Patient Position: Sitting, Cuff Size: Large)   Pulse (!) 108   Temp 98.3 F (36.8 C) (Oral)   Resp 16   Ht 5' 4.5" (1.638 m)   Wt 196 lb (88.9 kg)   LMP 04/27/2007   BMI 33.12 kg/m  Vitals:   10/28/16 0955  BP: (!) 160/86  Pulse: (!) 108  Resp: 16  Temp: 98.3 F (36.8 C)  TempSrc: Oral  Weight: 196 lb (88.9 kg)  Height: 5' 4.5" (1.638 m)     Physical Exam  Constitutional: She appears well-developed and well-nourished. No distress.  Neck: Normal range of motion. Neck supple. No JVD present. No tracheal deviation present. No thyromegaly present.  Cardiovascular: Normal rate, regular rhythm and normal heart sounds.  Exam reveals  no gallop and no friction rub.   No murmur heard. Pulmonary/Chest: Effort normal and breath sounds normal. No respiratory distress. She has no wheezes. She has no rales.  Lymphadenopathy:    She has no cervical adenopathy.  Skin: She is not diaphoretic.  Psychiatric: Her speech is normal. Judgment and thought content normal. She is withdrawn. Cognition and memory are normal. She exhibits a depressed mood. She expresses no suicidal plans and no homicidal plans.  Tearful through entire interview  Vitals reviewed.       Assessment &  Plan:     1. Depression, major, single episode, moderate ([place]) Discussed in detail benefit of counseling and other medication options. Discussed disease process of depression. We decided to increase venlafaxine to 225mg  as below as we know she tolerates this well. Patient declines counseling at this time. May consider changing therapy at follow up if no improvement. I will see her back in 4 weeks. She is to call if she has worsening symptoms in the meantime.  - Venlafaxine HCl 225 MG TB24; Take 1 tablet (225 mg total) by mouth daily.  Dispense: 30 each; Refill: 0       Mar Daring, [name]  [facility]

## 2016-10-28 NOTE — Patient Instructions (Signed)
10 Relaxation Techniques That Zap Stress Fast By Jeannette Moninger   Listen  Relax. You deserve it, it's good for you, and it takes less time than you think. You don't need a spa weekend or a retreat. Each of these stress-relieving tips can get you from OMG to om in less than 15 minutes. 1. Meditate  A few minutes of practice per day can help ease anxiety. "Research suggests that daily meditation may alter the brain's neural pathways, making you more resilient to stress," says psychologist Robbie Maller Hartman, PhD, a [place] health and wellness coach. It's simple. Sit up straight with both feet on the floor. Close your eyes. Focus your attention on reciting -- out loud or silently -- a positive mantra such as "I feel at peace" or "I love myself." Place one hand on your belly to sync the mantra with your breaths. Let any distracting thoughts float by like clouds. 2. Breathe Deeply  Take a 5-minute break and focus on your breathing. Sit up straight, eyes closed, with a hand on your belly. Slowly inhale through your nose, feeling the breath start in your abdomen and work its way to the top of your head. Reverse the process as you exhale through your mouth.  "Deep breathing counters the effects of stress by slowing the heart rate and lowering blood pressure," psychologist Judith Tutin, PhD, says. She's a certified life coach in [place], [place] 3. Be Present  Slow down.  "Take 5 minutes and focus on only one behavior with awareness," Tutin says. Notice how the air feels on your face when you're walking and how your feet feel hitting the ground. Enjoy the texture and taste of each bite of food. When you spend time in the moment and focus on your senses, you should feel less tense. 4. Reach Out  Your social network is one of your best tools for handling stress. Talk to others -- preferably face to face, or at least on the phone. Share what's going on. You can get a fresh perspective while keeping your  connection strong. 5. Tune In to Your Body  Mentally scan your body to get a sense of how stress affects it each day. Lie on your back, or sit with your feet on the floor. Start at your toes and work your way up to your scalp, noticing how your body feels.  10 Relaxation Techniques That Zap Stress Fast By Jeannette Moninger   Listen  "Simply be aware of places you feel tight or loose without trying to change anything," Tutin says. For 1 to 2 minutes, imagine each deep breath flowing to that body part. Repeat this process as you move your focus up your body, paying close attention to sensations you feel in each body part. 6. Decompress  Place a warm heat wrap around your neck and shoulders for 10 minutes. Close your eyes and relax your face, neck, upper chest, and back muscles. Remove the wrap, and use a tennis ball or foam roller to massage away tension.  "Place the ball between your back and the wall. Lean into the ball, and hold gentle pressure for up to 15 seconds. Then move the ball to another spot, and apply pressure," says Cathy Benninger, a nurse practitioner and assistant professor at [employer] [facility] in [place]. 7. Laugh Out Loud  A good belly laugh doesn't just lighten the load mentally. It lowers cortisol, your body's stress hormone, and boosts brain chemicals called endorphins, which help   your mood. Lighten up by tuning in to your favorite sitcom or video, reading the comics, or chatting with someone who makes you smile. 8. Crank Up the Tunes  Research shows that listening to soothing music can lower blood pressure, heart rate, and anxiety. "Create a playlist of songs or nature sounds (the ocean, a bubbling brook, birds chirping), and allow your mind to focus on the different melodies, instruments, or singers in the piece," Benninger says. You also can blow off steam by rocking out to more upbeat tunes -- or singing at the top of your lungs! 9. Get Moving   You don't have to run in order to get a runner's high. All forms of exercise, including yoga and walking, can ease depression and anxiety by helping the brain release feel-good chemicals and by giving your body a chance to practice dealing with stress. You can go for a quick walk around the block, take the stairs up and down a few flights, or do some stretching exercises like head rolls and shoulder shrugs. 10. Be Grateful  Keep a gratitude journal or several (one by your bed, one in your purse, and one at work) to help you remember all the things that are good in your life.  "Being grateful for your blessings cancels out negative thoughts and worries," says Joni Emmerling, a wellness coach in [place], [place].  Use these journals to savor good experiences like a child's smile, a sunshine-filled day, and good health. Don't forget to celebrate accomplishments like mastering a new task at work or a new hobby. When you start feeling stressed, spend a few minutes looking through your notes to remind yourself what really matters.  

## 2016-12-06 ENCOUNTER — Other Ambulatory Visit: Payer: Self-pay | Admitting: Physician Assistant

## 2016-12-06 DIAGNOSIS — F321 Major depressive disorder, single episode, moderate: Secondary | ICD-10-CM

## 2016-12-29 ENCOUNTER — Encounter: Payer: Self-pay | Admitting: Physician Assistant

## 2017-01-06 ENCOUNTER — Telehealth: Payer: Self-pay | Admitting: Physician Assistant

## 2017-01-06 NOTE — Telephone Encounter (Signed)
Error/MW °

## 2017-01-31 ENCOUNTER — Telehealth: Payer: Self-pay | Admitting: Physician Assistant

## 2017-01-31 DIAGNOSIS — F324 Major depressive disorder, single episode, in partial remission: Secondary | ICD-10-CM

## 2017-01-31 MED ORDER — VENLAFAXINE HCL ER 225 MG PO TB24: 225.0000 mg | ORAL_TABLET | Freq: Every day | ORAL | 3 refills | Status: DC

## 2017-01-31 NOTE — Telephone Encounter (Signed)
Pt states she would like a refill of 225 MG of Effexor 1 daily - [facility] states her insurance will pay for that dosage.

## 2017-01-31 NOTE — Telephone Encounter (Signed)
Refill sent.

## 2017-02-24 ENCOUNTER — Other Ambulatory Visit: Payer: Self-pay | Admitting: Physician Assistant

## 2017-02-24 DIAGNOSIS — I10 Essential (primary) hypertension: Secondary | ICD-10-CM

## 2017-02-24 DIAGNOSIS — E039 Hypothyroidism, unspecified: Secondary | ICD-10-CM

## 2017-03-01 ENCOUNTER — Other Ambulatory Visit: Payer: Self-pay | Admitting: Unknown Physician Specialty

## 2017-03-01 DIAGNOSIS — M1711 Unilateral primary osteoarthritis, right knee: Secondary | ICD-10-CM

## 2017-03-11 ENCOUNTER — Ambulatory Visit
Admission: RE | Admit: 2017-03-11 | Discharge: 2017-03-11 | Disposition: A | Discharge status: Home / Self Care | Payer: BLUE CROSS/BLUE SHIELD | Source: Ambulatory Visit | Attending: Unknown Physician Specialty | Admitting: Unknown Physician Specialty

## 2017-03-11 DIAGNOSIS — S83241A Other tear of medial meniscus, current injury, right knee, initial encounter: Secondary | ICD-10-CM | POA: Diagnosis not present

## 2017-03-11 DIAGNOSIS — X58XXXA Exposure to other specified factors, initial encounter: Secondary | ICD-10-CM | POA: Diagnosis not present

## 2017-03-11 DIAGNOSIS — M1711 Unilateral primary osteoarthritis, right knee: Secondary | ICD-10-CM | POA: Insufficient documentation

## 2017-03-25 ENCOUNTER — Other Ambulatory Visit: Payer: Self-pay | Admitting: Physician Assistant

## 2017-03-25 DIAGNOSIS — F419 Anxiety disorder, unspecified: Secondary | ICD-10-CM

## 2017-03-26 NOTE — Telephone Encounter (Signed)
Prescription for Lorazepam 1 mg phoned in to [facility]. Spoke with Gaspar Bidding pharmacist.  Thanks,  -Joseline

## 2017-06-14 ENCOUNTER — Telehealth: Payer: Self-pay | Admitting: Physician Assistant

## 2017-06-14 DIAGNOSIS — E119 Type 2 diabetes mellitus without complications: Secondary | ICD-10-CM

## 2017-06-14 DIAGNOSIS — E039 Hypothyroidism, unspecified: Secondary | ICD-10-CM

## 2017-06-14 DIAGNOSIS — I10 Essential (primary) hypertension: Secondary | ICD-10-CM

## 2017-06-14 DIAGNOSIS — E785 Hyperlipidemia, unspecified: Secondary | ICD-10-CM

## 2017-06-14 NOTE — Telephone Encounter (Signed)
Lm

## 2017-06-14 NOTE — Telephone Encounter (Signed)
Lab order signed.

## 2017-06-14 NOTE — Telephone Encounter (Signed)
Patient is having a CPE on 07/01/17 with jenni and would like to have lab work done before her CPE. It's not easy for her to get off work.

## 2017-06-27 ENCOUNTER — Other Ambulatory Visit: Payer: Self-pay | Admitting: Physician Assistant

## 2017-06-27 DIAGNOSIS — I10 Essential (primary) hypertension: Secondary | ICD-10-CM

## 2017-06-27 DIAGNOSIS — F321 Major depressive disorder, single episode, moderate: Secondary | ICD-10-CM

## 2017-06-27 DIAGNOSIS — E785 Hyperlipidemia, unspecified: Secondary | ICD-10-CM

## 2017-07-01 ENCOUNTER — Ambulatory Visit (INDEPENDENT_AMBULATORY_CARE_PROVIDER_SITE_OTHER): Payer: BLUE CROSS/BLUE SHIELD | Admitting: Physician Assistant

## 2017-07-01 ENCOUNTER — Encounter: Payer: Self-pay | Admitting: Physician Assistant

## 2017-07-01 VITALS — BP 120/60 | HR 76 | Temp 98.8°F | Resp 16 | Ht 65.0 in | Wt 204.8 lb

## 2017-07-01 DIAGNOSIS — E119 Type 2 diabetes mellitus without complications: Secondary | ICD-10-CM

## 2017-07-01 DIAGNOSIS — Z124 Encounter for screening for malignant neoplasm of cervix: Secondary | ICD-10-CM | POA: Diagnosis not present

## 2017-07-01 DIAGNOSIS — I10 Essential (primary) hypertension: Secondary | ICD-10-CM | POA: Diagnosis not present

## 2017-07-01 DIAGNOSIS — Z0000 Encounter for general adult medical examination without abnormal findings: Secondary | ICD-10-CM | POA: Diagnosis not present

## 2017-07-01 DIAGNOSIS — E7800 Pure hypercholesterolemia, unspecified: Secondary | ICD-10-CM

## 2017-07-01 DIAGNOSIS — Z1231 Encounter for screening mammogram for malignant neoplasm of breast: Secondary | ICD-10-CM

## 2017-07-01 DIAGNOSIS — Z720 Tobacco use: Secondary | ICD-10-CM

## 2017-07-01 DIAGNOSIS — Z6834 Body mass index (BMI) 34.0-34.9, adult: Secondary | ICD-10-CM

## 2017-07-01 DIAGNOSIS — Z1239 Encounter for other screening for malignant neoplasm of breast: Secondary | ICD-10-CM

## 2017-07-01 DIAGNOSIS — R87612 Low grade squamous intraepithelial lesion on cytologic smear of cervix (LGSIL): Secondary | ICD-10-CM | POA: Diagnosis not present

## 2017-07-01 DIAGNOSIS — F3341 Major depressive disorder, recurrent, in partial remission: Secondary | ICD-10-CM

## 2017-07-01 LAB — HM MAMMOGRAPHY

## 2017-07-01 NOTE — Progress Notes (Signed)
Patient: Regina Pierce, Female    DOB: 21-Oct-1955, 62 y.o.   MRN: [id number] Visit Date: 07/01/2017  Today's Provider: Mar Daring, [name]   Chief Complaint  Patient presents with  . Annual Exam   Subjective:    Annual physical exam [name] is a 62 y.o. female who presents today for health maintenance and complete physical. She feels well. She reports exercising. She reports she is sleeping well.  Last CPE:06/24/16 Pap:0301/2018 abnormal for LGSIL and positive for HPV. Referred to GYN. Colonoscopy:07/20/2016 Labs:Done Today. Mammogram was done today also. -----------------------------------------------------------------   Review of Systems  Constitutional: Negative.   HENT: Positive for ear discharge and mouth sores.   Eyes: Negative.   Respiratory: Negative.   Cardiovascular: Negative.   Gastrointestinal: Positive for abdominal distention, abdominal pain, constipation and diarrhea.  Endocrine: Negative.   Genitourinary: Positive for hematuria.  Musculoskeletal: Positive for arthralgias and back pain.  Skin: Negative.   Allergic/Immunologic: Negative.   Neurological: Negative.   Hematological: Bruises/bleeds easily.  Psychiatric/Behavioral: Negative.     Social History      She  reports that she has been smoking cigarettes.  She has a 30.00 pack-year smoking history. she has never used smokeless tobacco. She reports that she does not drink alcohol or use drugs.       Social History   Socioeconomic History  . Marital status: Married    Spouse name: None  . Number of children: None  . Years of education: None  . Highest education level: None  Social Needs  . Financial resource strain: None  . Food insecurity - worry: None  . Food insecurity - inability: None  . Transportation needs - medical: None  . Transportation needs - non-medical: None  Occupational History  . None  Tobacco Use  . Smoking status: Current Every Day Smoker    Packs/day:  1.00    Years: 30.00    Pack years: 30.00    Types: Cigarettes  . Smokeless tobacco: Never Used  Substance and Sexual Activity  . Alcohol use: No    Alcohol/week: 0.0 oz  . Drug use: No  . Sexual activity: Not Currently    Birth control/protection: Post-menopausal  Other Topics Concern  . None  Social History Narrative  . None    Past Medical History:  Diagnosis Date  . Anxiety   . Depression   . Diabetes mellitus without complication (HCC)    no meds  . Hyperlipidemia   . Hypertension   . Thyroid disease      Patient Active Problem List   Diagnosis Date Noted  . Low grade squamous intraepithelial lesion (LGSIL) on cervical Pap smear 06/29/2016  . Pap smear abnormality of cervix/human papillomavirus (HPV) positive 06/29/2016  . Type 2 diabetes mellitus without complications ([place]) 62/83/1517  . Tobacco abuse 05/13/2015  . Polyp of colon 01/14/2015  . Clinical depression 01/14/2015  . 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 Hyperlipidemia, unspecified 01/14/2015  . 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 Cyst of ovary 01/14/2015  . Diaphragmatic hernia without obstruction or gangrene 01/14/2015  . Major depressive disorder, single episode 01/14/2015  . Anxiety 01/13/2015  . BP (high blood pressure) 01/13/2015  . Hypothyroidism 01/13/2015  . Depression 11/05/2014    Past Surgical History:  Procedure Laterality Date  . ACHILLES TENDON SURGERY Left 05/12/2016   Procedure: ACHILLES TENDON REPAIR;  Surgeon: Samara Deist, DPM;  Location: [facility] ORS;  Service: Podiatry;  Laterality: Left;  .  BONE EXOSTOSIS EXCISION Left 05/12/2016   Procedure: CALCANEAL EXOSTECTOMY AND DORSAL EXOSTECTOMY;  Surgeon: Samara Deist, DPM;  Location: [facility] ORS;  Service: Podiatry;  Laterality: Left;  . COLONOSCOPY WITH PROPOFOL N/A 07/20/2016   Procedure: COLONOSCOPY WITH PROPOFOL;  Surgeon: Jonathon Bellows, MD;  Location: [facility] ENDOS[facility];  Service: Endoscopy;  Laterality: N/A;  . HAND  SURGERY Bilateral 1999   carpal tunnel   . KNEE SURGERY  2010  . TUBAL LIGATION  1987    Family History        Family Status  Relation Name Status  . Mother  Deceased  . Father  Deceased  . Sister  Deceased at age 14       heart defect  . Brother  Alive  . Sister  Deceased       [place] accident  . Sister  Alive        Her family history includes Hypertension in her sister.      No Known Allergies   Current Outpatient Medications:  .  acetaminophen (TYLENOL) 500 MG tablet, Take 1,000 mg by mouth every 6 (six) hours as needed for mild pain., Disp: , Rfl:  .  aspirin 81 MG tablet, Take 81 mg by mouth every evening. , Disp: , Rfl:  .  levothyroxine (SYNTHROID, LEVOTHROID) 150 MCG tablet, TAKE 1 TABLET BY MOUTH ONCE DAILY, Disp: 90 tablet, Rfl: 1 .  lisinopril-hydrochlorothiazide (PRINZIDE,ZESTORETIC) 20-25 MG tablet, TAKE 1 TABLET BY MOUTH ONCE DAILY, Disp: 90 tablet, Rfl: 1 .  LORazepam (ATIVAN) 1 MG tablet, TAKE 1 TABLET BY MOUTH TWICE (2) DAILY AS NEEDED FOR ANXIETY, Disp: 180 tablet, Rfl: 1 .  metoprolol succinate (TOPROL-XL) 25 MG 24 hr tablet, Take 1 tablet (25 mg total) by mouth daily., Disp: 90 tablet, Rfl: 1 .  metoprolol succinate (TOPROL-XL) 25 MG 24 hr tablet, TAKE 1 TABLET BY MOUTH ONCE DAILY, Disp: 30 tablet, Rfl: 5 .  naproxen sodium (ANAPROX) 220 MG tablet, Take 440 mg by mouth daily as needed (pain)., Disp: , Rfl:  .  simvastatin (ZOCOR) 10 MG tablet, Take 1 tablet (10 mg total) by mouth at bedtime., Disp: 90 tablet, Rfl: 1 .  simvastatin (ZOCOR) 10 MG tablet, TAKE 1 TABLET BY MOUTH AT BEDTIME, Disp: 30 tablet, Rfl: 5 .  Venlafaxine HCl 225 MG TB24, Take 1 tablet (225 mg total) by mouth daily., Disp: 90 tablet, Rfl: 3 .  venlafaxine XR (EFFEXOR-XR) 75 MG 24 hr capsule, TAKE 3 CAPSULES BY MOUTH ONCE DAILY (TO EQUAL 225MG ), Disp: 90 capsule, Rfl: 5   Patient Care Team: Mar Daring, [name] as PCP - General (Family Medicine)      Objective:   Vitals: BP  120/60 (BP Location: Left Arm, Patient Position: Sitting, Cuff Size: Normal)   Pulse 76   Temp 98.8 F (37.1 C) (Oral)   Resp 16   Ht 5\' 5"  (1.651 m)   Wt 204 lb 12.8 oz (92.9 kg)   LMP 04/27/2007   SpO2 97%   BMI 34.08 kg/m    Vitals:   07/01/17 1417  BP: 120/60  Pulse: 76  Resp: 16  Temp: 98.8 F (37.1 C)  TempSrc: Oral  SpO2: 97%  Weight: 204 lb 12.8 oz (92.9 kg)  Height: 5\' 5"  (1.651 m)     Physical Exam  Constitutional: She is oriented to person, place, and time. She appears well-developed and well-nourished. No distress.  HENT:  Head: Normocephalic and atraumatic.  Right Ear: Hearing, tympanic membrane, external ear and  ear canal normal.  Left Ear: Hearing, tympanic membrane, external ear and ear canal normal.  Nose: Nose normal.  Mouth/Throat: Uvula is midline, oropharynx is clear and moist and mucous membranes are normal. No oropharyngeal exudate.  Eyes: Conjunctivae and EOM are normal. Pupils are equal, round, and reactive to light. Right eye exhibits no discharge. Left eye exhibits no discharge. No scleral icterus.  Neck: Normal range of motion. Neck supple. No JVD present. Carotid bruit is not present. No tracheal deviation present. No thyromegaly present.  Cardiovascular: Normal rate, regular rhythm, normal heart sounds and intact distal pulses. Exam reveals no gallop and no friction rub.  No murmur heard. Pulmonary/Chest: Effort normal and breath sounds normal. No respiratory distress. She has no wheezes. She has no rales. She exhibits no tenderness. Right breast exhibits no inverted nipple, no mass, no nipple discharge, no skin change and no tenderness. Left breast exhibits no inverted nipple, no mass, no nipple discharge, no skin change and no tenderness. Breasts are symmetrical.  Abdominal: Soft. Bowel sounds are normal. She exhibits no distension and no mass. There is no tenderness. There is no rebound and no guarding. Hernia confirmed negative in the right  inguinal area and confirmed negative in the left inguinal area.  Genitourinary: Rectum normal, vagina normal and uterus normal. No breast swelling, tenderness, discharge or bleeding. Pelvic exam was performed with patient supine. There is no rash, tenderness, lesion or injury on the right labia. There is no rash, tenderness, lesion or injury on the left labia. Cervix exhibits no motion tenderness, no discharge and no friability. Right adnexum displays no mass, no tenderness and no fullness. Left adnexum displays no mass, no tenderness and no fullness. No erythema, tenderness or bleeding in the vagina. No signs of injury around the vagina. No vaginal discharge found.  Musculoskeletal: Normal range of motion. She exhibits no edema or tenderness.  Lymphadenopathy:    She has no cervical adenopathy.       Right: No inguinal adenopathy present.       Left: No inguinal adenopathy present.  Neurological: She is alert and oriented to person, place, and time. She has normal reflexes. No cranial nerve deficit. Coordination normal.  Skin: Skin is warm and dry. No rash noted. She is not diaphoretic.  Psychiatric: She has a normal mood and affect. Her behavior is normal. Judgment and thought content normal.  Vitals reviewed.    Depression Screen PHQ 2/9 Scores 10/28/2016 06/24/2016 05/13/2015  PHQ - 2 Score 6 1 -  PHQ- 9 Score 15 2 -  Exception Documentation - - Patient refusal      Assessment & Plan:     Routine Health Maintenance and Physical Exam  Exercise Activities and Dietary recommendations Goals    . Exercise 150 minutes per week (moderate activity)       Immunization History  Administered Date(s) Administered  . Influenza Split 01/17/2009, 01/22/2010, 03/31/2011, 01/14/2012  . Influenza,inj,Quad PF,6+ Mos 04/13/2013, 05/13/2015  . Tdap 12/07/2007    Health Maintenance  Topic Date Due  . PNEUMOCOCCAL POLYSACCHARIDE VACCINE (1) 08/07/1959  . FOOT EXAM  08/07/1967  . OPHTHALMOLOGY  EXAM  08/07/1967  . HIV Screening  08/06/1972  . INFLUENZA VACCINE  11/24/2016  . HEMOGLOBIN A1C  12/31/2016  . MAMMOGRAM  06/29/2017  . TETANUS/TDAP  12/06/2017  . PAP SMEAR  06/25/2019  . COLONOSCOPY  07/20/2021  . Hepatitis C Screening  Completed     Discussed health benefits of physical activity, and encouraged her to  engage in regular exercise appropriate for her age and condition.    1. Annual physical exam Normal physical exam today. Will check labs as below and f/u pending lab results. If labs are stable and WNL she will not need to have these rechecked for one year at her next annual physical exam. She is to call the office in the meantime if she has any acute issue, questions or concerns.  2. Cervical cancer screening Followed by Dr. Marcelline Mates at this time due to abnormal last year.   3. Low grade squamous intraepithelial lesion (LGSIL) on cervical Pap smear Found last year. Advised to call Dr. Marcelline Mates, [facility], to schedule her repeat pap.   4. Breast cancer screening Mammogram done today.   5. Essential hypertension Stable. Continue Metoprolol 25mg  and lisinopril-hctz 20-25mg .   6. Type 2 diabetes mellitus without complication, without long-term current use of insulin (HCC) Stable. Diet controlled.   7. Pure hypercholesterolemia Stable. Continue Simvastatin 10mg .   8. Recurrent major depressive disorder, in partial remission (HCC) Stable. Continue venlafaxine.  9. Tobacco abuse Does not desire to quit.   10. BMI 34.0-34.9,adult Counseled patient on healthy lifestyle modifications including dieting and exercise.   --------------------------------------------------------------------    Mar Daring, [name]  [facility]

## 2017-07-01 NOTE — Patient Instructions (Signed)
Call Dr. 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 This process happens gradually over a span of months to years, usually between the ages of 20 and 25. Menopause is complete when you have missed 12 consecutive menstrual periods. It is important to talk with your health care provider about some of the most common conditions that affect postmenopausal women, such as heart disease, cancer, and bone loss (osteoporosis). Adopting a healthy lifestyle and getting preventive care can help to promote your health and wellness. Those actions can also lower your chances of developing some of these common conditions. What should I know about menopause? During menopause, you may experience a number of symptoms, such as:  Moderate-to-severe hot flashes.  Night sweats.  Decrease in sex drive.  Mood swings.  Headaches.  Tiredness.  Irritability.  Memory problems.  Insomnia.  Choosing to treat or not to treat menopausal changes is an individual decision that you make with your health care provider. What should I know about hormone replacement therapy and supplements? Hormone therapy products are effective for treating symptoms that are associated with menopause, such as hot flashes and night sweats. Hormone replacement carries certain risks, especially as you become older. If you are thinking about using estrogen or estrogen with progestin treatments, discuss the benefits and risks with your health care provider. What should I know about heart disease and stroke? Heart disease, heart attack, and stroke become more likely as you age. This may be due, in part, to the hormonal changes that your body experiences during menopause. These can affect how your body processes dietary fats, triglycerides, and cholesterol. Heart attack and  stroke are both medical emergencies. There are many things that you can do to help prevent heart disease and stroke:  Have your blood pressure checked at least every 1-2 years. High blood pressure causes heart disease and increases the risk of stroke.  If you are 58-87 years old, ask your health care provider if you should take aspirin to prevent a heart attack or a stroke.  Do not use any tobacco products, including cigarettes, chewing tobacco, or electronic cigarettes. If you need help quitting, ask your health care provider.  It is important to eat a healthy diet and maintain a healthy weight. ? Be sure to include plenty of vegetables, fruits, low-fat dairy products, and lean protein. ? Avoid eating foods that are high in solid fats, added sugars, or salt (sodium).  Get regular exercise. This is one of the most important things that you can do for your health. ? Try to exercise for at least 150 minutes each week. The type of exercise that you do should increase your heart rate and make you sweat. This is known as moderate-intensity exercise. ? Try to do strengthening exercises at least twice each week. Do these in addition to the moderate-intensity exercise.  Know your numbers.Ask your health care provider to check your cholesterol and your blood glucose. Continue to have your blood tested as directed by your health care provider.  What should I know about cancer screening? There are several types of cancer. Take the following steps to reduce your risk and to catch any cancer development as early as possible. Breast Cancer  Practice breast self-awareness. ? This means understanding how your breasts normally appear and feel. ? It also means doing regular breast self-exams.  Let your health care provider know about any changes, no matter how small.  If you are 36 or older, have a clinician do a breast exam (clinical breast exam or CBE) every year. Depending on your age, family history,  and medical history, it may be recommended that you also have a yearly breast X-ray (mammogram).  If you have a family history of breast cancer, talk with your health care provider about genetic screening.  If you are at high risk for breast cancer, talk with your health care provider about having an MRI and a mammogram every year.  Breast cancer (BRCA) gene test is recommended for women who have family members with BRCA-related cancers. Results of the assessment will determine the need for genetic counseling and BRCA1 and for BRCA2 testing. BRCA-related cancers include these types: ? Breast. This occurs in males or females. ? Ovarian. ? Tubal. This may also be called fallopian tube cancer. ? Cancer of the abdominal or pelvic lining (peritoneal cancer). ? Prostate. ? Pancreatic.  Cervical, Uterine, and Ovarian Cancer Your health care provider may recommend that you be screened regularly for cancer of the pelvic organs. These include your ovaries, uterus, and vagina. This screening involves a pelvic exam, which includes checking for microscopic changes to the surface of your cervix (Pap test).  For women ages 21-65, health care providers may recommend a pelvic exam and a Pap test every three years. For women ages 79-65, they may recommend the Pap test and pelvic exam, combined with testing for human papilloma virus (HPV), every five years. Some types of HPV increase your risk of cervical cancer. Testing for HPV may also be done on women of any age who have unclear Pap test results.  Other health care providers may not recommend any screening for nonpregnant women who are considered low risk for pelvic cancer and have no symptoms. Ask your health care provider if a screening pelvic exam is right for you.  If you have had past treatment for cervical cancer or a condition that could lead to cancer, you need Pap tests and screening for cancer for at least 20 years after your treatment. If Pap tests  have been discontinued for you, your risk factors (such as having a new sexual partner) need to be reassessed to determine if you should start having screenings again. Some women have medical problems that increase the chance of getting cervical cancer. In these cases, your health care provider may recommend that you have screening and Pap tests more often.  If you have a family history of uterine cancer or ovarian cancer, talk with your health care provider about genetic screening.  If you have vaginal bleeding after reaching menopause, tell your health care provider.  There are currently no reliable tests available to screen for ovarian cancer.  Lung Cancer Lung cancer screening is recommended for adults 87-51 years old who are at high risk for lung cancer because of a history of smoking. A yearly low-dose CT scan of the lungs is recommended if you:  Currently smoke.  Have a history of at least 30 pack-years of smoking and you currently smoke or have quit within the past 15 years. A pack-year is smoking an average of one pack of cigarettes per day for one year.  Yearly screening should:  Continue until it has been 15 years since you quit.  Stop if you develop a health problem that would prevent you from having lung cancer treatment.  Colorectal Cancer  This type  of cancer can be detected and can often be prevented.  Routine colorectal cancer screening usually begins at age 88 and continues through age 14.  If you have risk factors for colon cancer, your health care provider may recommend that you be screened at an earlier age.  If you have a family history of colorectal cancer, talk with your health care provider about genetic screening.  Your health care provider may also recommend using home test kits to check for hidden blood in your stool.  A small camera at the end of a tube can be used to examine your colon directly (sigmoidoscopy or colonoscopy). This is done to check for  the earliest forms of colorectal cancer.  Direct examination of the colon should be repeated every 5-10 years until age 50. However, if early forms of precancerous polyps or small growths are found or if you have a family history or genetic risk for colorectal cancer, you may need to be screened more often.  Skin Cancer  Check your skin from head to toe regularly.  Monitor any moles. Be sure to tell your health care provider: ? About any new moles or changes in moles, especially if there is a change in a mole's shape or color. ? If you have a mole that is larger than the size of a pencil eraser.  If any of your family members has a history of skin cancer, especially at a young age, talk with your health care provider about genetic screening.  Always use sunscreen. Apply sunscreen liberally and repeatedly throughout the day.  Whenever you are outside, protect yourself by wearing long sleeves, pants, a wide-brimmed hat, and sunglasses.  What should I know about osteoporosis? Osteoporosis is a condition in which bone destruction happens more quickly than new bone creation. After menopause, you may be at an increased risk for osteoporosis. To help prevent osteoporosis or the bone fractures that can happen because of osteoporosis, the following is recommended:  If you are 19-42 years old, get at least 1,000 mg of calcium and at least 600 mg of vitamin D per day.  If you are older than age 81 but younger than age 56, get at least 1,200 mg of calcium and at least 600 mg of vitamin D per day.  If you are older than age 44, get at least 1,200 mg of calcium and at least 800 mg of vitamin D per day.  Smoking and excessive alcohol intake increase the risk of osteoporosis. Eat foods that are rich in calcium and vitamin D, and do weight-bearing exercises several times each week as directed by your health care provider. What should I know about how menopause affects my mental health? Depression may  occur at any age, but it is more common as you become older. Common symptoms of depression include:  Low or sad mood.  Changes in sleep patterns.  Changes in appetite or eating patterns.  Feeling an overall lack of motivation or enjoyment of activities that you previously enjoyed.  Frequent crying spells.  Talk with your health care provider if you think that you are experiencing depression. What should I know about immunizations? It is important that you get and maintain your immunizations. These include:  Tetanus, diphtheria, and pertussis (Tdap) booster vaccine.  Influenza every year before the flu season begins.  Pneumonia vaccine.  Shingles vaccine.  Your health care provider may also recommend other immunizations. This information is not intended to replace advice given to you by your health  care provider. Make sure you discuss any questions you have with your health care provider. Document Released: 06/04/2005 Document Revised: 10/31/2015 Document Reviewed: 01/14/2015 Elsevier Interactive Patient Education  2018 [employer].

## 2017-07-02 LAB — CBC WITH DIFFERENTIAL/PLATELET
Basophils Absolute: 0 10*3/uL (ref 0.0–0.2)
Basos: 1 %
EOS (ABSOLUTE): 0.1 10*3/uL (ref 0.0–0.4)
EOS: 2 %
HEMATOCRIT: 44.2 % (ref 34.0–46.6)
HEMOGLOBIN: 15.2 g/dL (ref 11.1–15.9)
IMMATURE GRANS (ABS): 0 10*3/uL (ref 0.0–0.1)
IMMATURE GRANULOCYTES: 0 %
Lymphocytes Absolute: 1.8 10*3/uL (ref 0.7–3.1)
Lymphs: 31 %
MCH: 32.3 pg (ref 26.6–33.0)
MCHC: 34.4 g/dL (ref 31.5–35.7)
MCV: 94 fL (ref 79–97)
MONOCYTES: 4 %
MONOS ABS: 0.2 10*3/uL (ref 0.1–0.9)
NEUTROS PCT: 62 %
Neutrophils Absolute: 3.7 10*3/uL (ref 1.4–7.0)
Platelets: 145 10*3/uL — ABNORMAL LOW (ref 150–379)
RBC: 4.71 x10E6/uL (ref 3.77–5.28)
RDW: 12.8 % (ref 12.3–15.4)
WBC: 5.8 10*3/uL (ref 3.4–10.8)

## 2017-07-02 LAB — COMPREHENSIVE METABOLIC PANEL
A/G RATIO: 2 (ref 1.2–2.2)
ALBUMIN: 4.4 g/dL (ref 3.5–5.5)
ALT: 13 IU/L (ref 0–32)
AST: 18 IU/L (ref 0–40)
Alkaline Phosphatase: 61 IU/L (ref 39–117)
BUN/Creatinine Ratio: 21 (ref 9–23)
BUN: 16 mg/dL (ref 6–24)
Bilirubin Total: 0.5 mg/dL (ref 0.0–1.2)
CALCIUM: 9.5 mg/dL (ref 8.7–10.2)
CO2: 24 mmol/L (ref 20–29)
CREATININE: 0.78 mg/dL (ref 0.57–1.00)
Chloride: 100 mmol/L (ref 96–106)
GFR, EST AFRICAN AMERICAN: 96 mL/min/{1.73_m2} (ref >59)
GFR, EST NON AFRICAN AMERICAN: 83 mL/min/{1.73_m2} (ref >59)
GLOBULIN, TOTAL: 2.2 g/dL (ref 1.5–4.5)
Glucose: 121 mg/dL — ABNORMAL HIGH (ref 65–99)
POTASSIUM: 4.3 mmol/L (ref 3.5–5.2)
SODIUM: 140 mmol/L (ref 134–144)
TOTAL PROTEIN: 6.6 g/dL (ref 6.0–8.5)

## 2017-07-02 LAB — LIPID PANEL WITH LDL/HDL RATIO
Cholesterol, Total: 151 mg/dL (ref 100–199)
HDL: 46 mg/dL (ref >39)
LDL Calculated: 83 mg/dL (ref 0–99)
LDL/HDL RATIO: 1.8 ratio (ref 0.0–3.2)
TRIGLYCERIDES: 110 mg/dL (ref 0–149)
VLDL Cholesterol Cal: 22 mg/dL (ref 5–40)

## 2017-07-02 LAB — HEMOGLOBIN A1C
ESTIMATED AVERAGE GLUCOSE: 137 mg/dL
Hgb A1c MFr Bld: 6.4 % — ABNORMAL HIGH (ref 4.8–5.6)

## 2017-07-02 LAB — TSH: TSH: 0.643 u[IU]/mL (ref 0.450–4.500)

## 2017-07-04 ENCOUNTER — Telehealth: Payer: Self-pay | Admitting: *Deleted

## 2017-07-04 NOTE — Telephone Encounter (Signed)
-----   Message from Mar Daring, [name] sent at 07/04/2017  1:21 PM EDT ----- A1c up to 6.4 from 6.0. Cholesterol normal. Thyroid normal. Blood count normal. Kidney and liver function normal. Continue working on healthy lifestyle modifications.

## 2017-07-04 NOTE — Telephone Encounter (Signed)
LM for pt to returncall

## 2017-07-05 ENCOUNTER — Encounter: Payer: Self-pay | Admitting: Physician Assistant

## 2017-07-05 ENCOUNTER — Telehealth: Payer: Self-pay | Admitting: Physician Assistant

## 2017-07-05 ENCOUNTER — Telehealth: Payer: Self-pay | Admitting: Obstetrics and Gynecology

## 2017-07-05 NOTE — Telephone Encounter (Signed)
Pt returned call about lab results and mammogram results. Please advise. Thanks TNP

## 2017-07-05 NOTE — Telephone Encounter (Signed)
LM again and also we received her mammogram results from [facility].

## 2017-07-05 NOTE — Telephone Encounter (Signed)
Mammogram: Normal. repeat screening in one year.

## 2017-07-05 NOTE — Telephone Encounter (Signed)
LM with husband to call us back or will try to call after 3:30 pm.  Thanks,  -Ellianna Ruest

## 2017-07-05 NOTE — Telephone Encounter (Signed)
Left message via voicemail to call the office to make an appt for her annual pap exam.

## 2017-07-05 NOTE — Telephone Encounter (Signed)
The patient called and stated that she needs to have a nurse or Dr. Marcelline Mates call her back to let her know what kind of appointment needs to be scheduled next, No other information was disclosed. Please advise.

## 2017-07-06 NOTE — Telephone Encounter (Signed)
Please see other telephone encounter.

## 2017-07-06 NOTE — Telephone Encounter (Signed)
Patient advised as directed below.  Thanks,  -Joseline 

## 2017-07-07 NOTE — Telephone Encounter (Signed)
Checked and noticed that pt received message and made an appointment to see [facility] for her annual exam.

## 2017-07-14 ENCOUNTER — Ambulatory Visit (INDEPENDENT_AMBULATORY_CARE_PROVIDER_SITE_OTHER): Payer: BLUE CROSS/BLUE SHIELD | Admitting: Obstetrics and Gynecology

## 2017-07-14 ENCOUNTER — Encounter: Payer: Self-pay | Admitting: Obstetrics and Gynecology

## 2017-07-14 VITALS — BP 133/72 | HR 73 | Ht 64.0 in | Wt 208.6 lb

## 2017-07-14 DIAGNOSIS — R87612 Low grade squamous intraepithelial lesion on cytologic smear of cervix (LGSIL): Secondary | ICD-10-CM | POA: Diagnosis not present

## 2017-07-14 DIAGNOSIS — N816 Rectocele: Secondary | ICD-10-CM | POA: Diagnosis not present

## 2017-07-14 DIAGNOSIS — N8110 Cystocele, unspecified: Secondary | ICD-10-CM | POA: Diagnosis not present

## 2017-07-14 NOTE — Progress Notes (Signed)
   GYNECOLOGY CLINIC PROGRESS NOTE Subjective:     Regina Pierce is a 62 y.o. [phone] woman who comes in today for a  pap smear only. Her most recent annual exam was on 07/01/2017 performed by her PCP. 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 Previous abnormal Pap smears: no. Contraception: post menopausal status  The following portions of the patient's history were reviewed and updated as appropriate: allergies, current medications, past family history, past medical history, past social history, past surgical history and problem list.  Review of Systems A comprehensive review of systems was negative except for: Gastrointestinal: positive for change in bowel habits, with alternating constipation and loose stools Genitourinary: positive for vaginal pressure.  Also notes that during her exam her PCP noted that her cervix might be falling.   Objective:    BP 133/72   Pulse 73   Ht 5\' 4"  (1.626 m)   Wt 208 lb 9.6 oz (94.6 kg)   LMP 04/27/2007   BMI 35.81 kg/m   General appearance: no acute distress Pelvic Exam:  VULVA: normal appearing vulva with no masses, tenderness or lesions,  VAGINA: normal appearing vagina with normal color and discharge, no lesions  PELVIC FLOOR EXAM: rectocele Grade 2-3, cystocele Grade 2,  CERVIX: normal appearing cervix without discharge or lesions UTERUS: uterus is normal size, shape, consistency and nontender ADNEXA: normal adnexa in size, nontender and no masses, RECTAL: normal rectal, no masses, rectovaginal exam confirms pelvic findings.  Pap smear obtained.   Assessment:     H/o abnormal pap smear (LGSIL).    Cystocele with rectocele   Alternating bowel habits  Plan:   - Pap smear performed today for h/o abnormal pap smear (LGSIL). Will notify patient of results and manage accordingly.  - Discussion had on cystocele and rectocele.  - Alternating  bowel habits. Discussed etiologies, such as structural manifestations due to rectocele, GI causes such as IBS or food allergen. Encouraged increasing fiber and water intake.  Discussed conservative management with Kegel exercises, mechanical management with pessary device, and surgical management with cystocele/rectocele repair.  Patient desires to think over options, but will start Kegel exercises for now.  If no further improvement with interventions, patient may need to be referred to GI for further workup of alternating bowel habit.    A total of 15 minutes were spent face-to-face with the patient during this encounter and over half of that time dealt with counseling and coordination of care.   Rubie Maid, MD [facility] 07/16/2017 3:01 PM

## 2017-07-14 NOTE — Progress Notes (Signed)
Pt is doing well.

## 2017-07-16 ENCOUNTER — Encounter: Payer: Self-pay | Admitting: Obstetrics and Gynecology

## 2017-07-22 LAB — IGP, COBASHPV16/18
HPV 16: NEGATIVE
HPV 18: NEGATIVE
HPV OTHER HR TYPES: NEGATIVE
PAP SMEAR COMMENT: 0

## 2017-07-26 ENCOUNTER — Other Ambulatory Visit: Payer: Self-pay | Admitting: Physician Assistant

## 2017-07-26 DIAGNOSIS — I10 Essential (primary) hypertension: Secondary | ICD-10-CM

## 2017-07-26 DIAGNOSIS — E039 Hypothyroidism, unspecified: Secondary | ICD-10-CM

## 2017-07-28 ENCOUNTER — Telehealth: Payer: Self-pay | Admitting: Obstetrics and Gynecology

## 2017-07-28 NOTE — Telephone Encounter (Signed)
The patient called and stated that she missed a call form Dr. Marcelline Mates, The patient did not disclose any other information . Please advise.

## 2017-07-28 NOTE — Telephone Encounter (Signed)
Pt was called back LM to call office for test results.

## 2017-07-29 NOTE — Telephone Encounter (Signed)
Pt called this morning and pt is aware of her test results from her pap smear

## 2017-09-22 ENCOUNTER — Telehealth: Payer: Self-pay | Admitting: Physician Assistant

## 2017-09-22 ENCOUNTER — Other Ambulatory Visit: Payer: Self-pay | Admitting: Physician Assistant

## 2017-09-22 DIAGNOSIS — F419 Anxiety disorder, unspecified: Secondary | ICD-10-CM

## 2017-09-22 NOTE — Telephone Encounter (Signed)
Pt states she has a life line screening done 09/16/17.  States the EKG came back with Ventriular Ectopy with abnormal "something" per the patient.    Pt states she will get the report in 21 days.   The nurse told the patient to call and report the results to her PCP.

## 2017-09-22 NOTE — Telephone Encounter (Signed)
Patient needs CPE before more refills 

## 2017-09-22 NOTE — Telephone Encounter (Signed)
Have patient schedule CPE and bring EKG then

## 2017-09-23 NOTE — Telephone Encounter (Signed)
Pt states she just had a CPE in March and she thinks she had an EKG then?  She will not get the report for 21 days.  Does she just need to make a fu appt  teri

## 2017-09-23 NOTE — Telephone Encounter (Signed)
Oh yes sorry I confused her with a patient I had responded to something earlier yesterday. Yes she needs an appt for an EKG. Thanks.

## 2017-09-23 NOTE — Telephone Encounter (Signed)
Patient had a CPE in March, but no EKG was done. Should we just scheduled a follow up appt for abnormal EKG? Please advise. Thanks!

## 2017-09-26 NOTE — Telephone Encounter (Signed)
Pt stated that she had spoken to a CMA in our office and was advised it would be ok to wait until she received the EKG report before scheduling an appt with Tawanna Sat. Pt stated that she doesn't have insurance at this time and she wanted to make sure she really needed to come in for another EKG. Please advise. Thanks TNP

## 2017-09-26 NOTE — Telephone Encounter (Signed)
LMTCB to schedule appt

## 2017-10-05 ENCOUNTER — Ambulatory Visit (INDEPENDENT_AMBULATORY_CARE_PROVIDER_SITE_OTHER): Payer: Self-pay | Admitting: Physician Assistant

## 2017-10-05 ENCOUNTER — Encounter: Payer: Self-pay | Admitting: Physician Assistant

## 2017-10-05 VITALS — BP 110/70 | HR 100 | Temp 100.3°F | Resp 20 | Wt 203.0 lb

## 2017-10-05 DIAGNOSIS — J209 Acute bronchitis, unspecified: Secondary | ICD-10-CM

## 2017-10-05 DIAGNOSIS — R9431 Abnormal electrocardiogram [ECG] [EKG]: Secondary | ICD-10-CM

## 2017-10-05 DIAGNOSIS — J440 Chronic obstructive pulmonary disease with acute lower respiratory infection: Secondary | ICD-10-CM

## 2017-10-05 MED ORDER — PREDNISONE 10 MG (21) PO TBPK: ORAL_TABLET | ORAL | 0 refills | Status: DC

## 2017-10-05 MED ORDER — AMOXICILLIN-POT CLAVULANATE 875-125 MG PO TABS: 1.0000 | ORAL_TABLET | Freq: Two times a day (BID) | ORAL | 0 refills | Status: DC

## 2017-10-05 NOTE — Progress Notes (Signed)
Patient: Regina Pierce Female    DOB: [birth year]   62 y.o.   MRN: [id number] Visit Date: 10/05/2017  Today's Provider: Mar Daring, [name]   Chief Complaint  Patient presents with  . Headache   Subjective:    HPI Patient here today C/O neck pain that stated on Sunday night. Patient reports body ache, sweating, and "buzzing in her head" and sleepy since Sunday. Patient denies nausea, vomiting, or visual changes. Patient denies chest pain shortness of breath. Patient reports neck pain does not radiate down to shoulders or arms. Patient reports that her husband was sick with vomiting and diarrhea last week. Patient reports he is better now. Patient reports she is eating and drinking okay.   She also had an abnormal EKG done through a life alert screening. It was a 4 lead EKG and showed possible ventricular ectopy. Will repeat EKG today.     No Known Allergies   Current Outpatient Medications:  .  acetaminophen (TYLENOL) 500 MG tablet, Take 1,000 mg by mouth every 6 (six) hours as needed for mild pain., Disp: , Rfl:  .  aspirin 81 MG tablet, Take 81 mg by mouth every evening. , Disp: , Rfl:  .  levothyroxine (SYNTHROID, LEVOTHROID) 150 MCG tablet, TAKE 1 TABLET BY MOUTH ONCE DAILY, Disp: 90 tablet, Rfl: 1 .  lisinopril-hydrochlorothiazide (PRINZIDE,ZESTORETIC) 20-25 MG tablet, TAKE 1 TABLET BY MOUTH ONCE DAILY, Disp: 90 tablet, Rfl: 1 .  LORazepam (ATIVAN) 1 MG tablet, TAKE 1 TABLET BY MOUTH 2 TIMES DAILY AS NEEDED FOR ANXIETY, Disp: 60 tablet, Rfl: 0 .  metoprolol succinate (TOPROL-XL) 25 MG 24 hr tablet, Take 1 tablet (25 mg total) by mouth daily., Disp: 90 tablet, Rfl: 1 .  naproxen sodium (ANAPROX) 220 MG tablet, Take 440 mg by mouth daily as needed (pain)., Disp: , Rfl:  .  simvastatin (ZOCOR) 10 MG tablet, Take 1 tablet (10 mg total) by mouth at bedtime., Disp: 90 tablet, Rfl: 1 .  venlafaxine XR (EFFEXOR-XR) 75 MG 24 hr capsule, TAKE 3 CAPSULES BY MOUTH ONCE DAILY (TO  EQUAL 225MG ), Disp: 90 capsule, Rfl: 5  Review of Systems  Constitutional: Positive for diaphoresis and fatigue.  HENT: Positive for postnasal drip, rhinorrhea, sinus pain and sore throat. Negative for ear pain.   Respiratory: Positive for cough and chest tightness. Negative for shortness of breath and wheezing.   Cardiovascular: Negative.   Gastrointestinal: Negative.   Genitourinary: Negative.   Musculoskeletal: Positive for myalgias and neck pain.  Neurological: Negative.     Social History   Tobacco Use  . Smoking status: Former Smoker    Packs/day: 1.00    Years: 30.00    Pack years: 30.00    Types: Cigarettes    Last attempt to quit: 01/24/2017    Years since quitting: 0.6  . Smokeless tobacco: Never Used  Substance Use Topics  . Alcohol use: No    Alcohol/week: 0.0 oz   Objective:   BP 110/70 (BP Location: Left Arm, Patient Position: Sitting, Cuff Size: Normal)   Pulse 100   Temp 100.3 F (37.9 C) (Oral)   Resp 20   Wt 203 lb (92.1 kg)   LMP 04/27/2007   SpO2 96%   BMI 34.84 kg/m  Vitals:   10/05/17 0952  BP: 110/70  Pulse: 100  Resp: 20  Temp: 100.3 F (37.9 C)  TempSrc: Oral  SpO2: 96%  Weight: 203 lb (92.1 kg)     Physical  Exam  Constitutional: She appears well-developed and well-nourished. No distress.  HENT:  Head: Normocephalic and atraumatic.  Right Ear: Hearing, tympanic membrane, external ear and ear canal normal.  Left Ear: Hearing, tympanic membrane, external ear and ear canal normal.  Nose: Mucosal edema present. Right sinus exhibits no maxillary sinus tenderness and no frontal sinus tenderness. Left sinus exhibits no maxillary sinus tenderness and no frontal sinus tenderness.  Mouth/Throat: Uvula is midline and mucous membranes are normal. Posterior oropharyngeal erythema present. No oropharyngeal exudate or posterior oropharyngeal edema.  Eyes: Pupils are equal, round, and reactive to light. Conjunctivae are normal. Right eye exhibits no  discharge. Left eye exhibits no discharge. No scleral icterus.  Neck: Normal range of motion. Neck supple. No tracheal deviation present. No thyromegaly present.  Cardiovascular: Normal rate, regular rhythm and normal heart sounds. Exam reveals no gallop and no friction rub.  No murmur heard. Pulmonary/Chest: Effort normal. No stridor. No respiratory distress. She has wheezes. She has rhonchi. She has no rales.  Lymphadenopathy:    She has no cervical adenopathy.  Skin: Skin is warm and dry. She is not diaphoretic.  Vitals reviewed.       Assessment & Plan:     1. Acute bronchitis with COPD ([place]) Worsening symptoms that have not responded to OTC medications. Will give augmentin and prednisone as below. Continue allergy medications. Stay well hydrated and get plenty of rest. Call if no symptom improvement or if symptoms worsen. - amoxicillin-clavulanate (AUGMENTIN) 875-125 MG tablet; Take 1 tablet by mouth 2 (two) times daily.  Dispense: 20 tablet; Refill: 0 - predniSONE (STERAPRED UNI-PAK 21 TAB) 10 MG (21) TBPK tablet; 6 day taper; take as directed on package instructions  Dispense: 21 tablet; Refill: 0  2. Abnormal EKG EKG today showed NSR with RSR in V1 (borderline incomplete RBBB). No ventricular ectopy noted today. Compared to EKG from 04/2016 it is essentially unchanged.  - EKG 12-Lead       Mar Daring, [name]  [facility]

## 2017-10-05 NOTE — Patient Instructions (Signed)
Chronic Obstructive Pulmonary Disease Exacerbation  Chronic obstructive pulmonary disease (COPD) is a common lung problem. In COPD, the flow of air from the lungs is limited. COPD exacerbations are times that breathing gets worse and you need extra treatment. Without treatment they can be life threatening. If they happen often, your lungs can become more damaged. If your COPD gets worse, your doctor may treat you with:  ? Medicines.  ? Oxygen.  ? Different ways to clear your airway, such as using a mask.    Follow these instructions at home:  ? Do not smoke.  ? Avoid tobacco smoke and other things that bother your lungs.  ? If given, take your antibiotic medicine as told. Finish the medicine even if you start to feel better.  ? Only take medicines as told by your doctor.  ? Drink enough fluids to keep your pee (urine) clear or pale yellow (unless your doctor has told you not to).  ? Use a cool mist machine (vaporizer).  ? If you use oxygen or a machine that turns liquid medicine into a mist (nebulizer), continue to use them as told.  ? Keep up with shots (vaccinations) as told by your doctor.  ? Exercise regularly.  ? Eat healthy foods.  ? Keep all doctor visits as told.  Get help right away if:  ? You are very short of breath and it gets worse.  ? You have trouble talking.  ? You have bad chest pain.  ? You have blood in your spit (sputum).  ? You have a fever.  ? You keep throwing up (vomiting).  ? You feel weak, or you pass out (faint).  ? You feel confused.  ? You keep getting worse.  This information is not intended to replace advice given to you by your health care provider. Make sure you discuss any questions you have with your health care provider.  Document Released: 04/01/2011 Document Revised: 09/18/2015 Document Reviewed: 12/15/2012  Elsevier Interactive Patient Education ? 2017 Elsevier Inc.

## 2018-01-02 ENCOUNTER — Telehealth: Payer: Self-pay | Admitting: Physician Assistant

## 2018-01-02 ENCOUNTER — Ambulatory Visit (INDEPENDENT_AMBULATORY_CARE_PROVIDER_SITE_OTHER): Payer: BLUE CROSS/BLUE SHIELD | Admitting: Physician Assistant

## 2018-01-02 ENCOUNTER — Encounter: Payer: Self-pay | Admitting: Physician Assistant

## 2018-01-02 VITALS — BP 140/80 | HR 76 | Temp 98.1°F | Resp 16 | Wt 198.6 lb

## 2018-01-02 DIAGNOSIS — F331 Major depressive disorder, recurrent, moderate: Secondary | ICD-10-CM | POA: Diagnosis not present

## 2018-01-02 DIAGNOSIS — F419 Anxiety disorder, unspecified: Secondary | ICD-10-CM

## 2018-01-02 DIAGNOSIS — Z716 Tobacco abuse counseling: Secondary | ICD-10-CM

## 2018-01-02 MED ORDER — NICOTINE 21 MG/24HR TD PT24: 21.0000 mg | MEDICATED_PATCH | Freq: Every day | TRANSDERMAL | 0 refills | Status: DC

## 2018-01-02 MED ORDER — LORAZEPAM 1 MG PO TABS: ORAL_TABLET | ORAL | 0 refills | Status: DC

## 2018-01-02 MED ORDER — BUPROPION HCL ER (XL) 150 MG PO TB24: 150.0000 mg | ORAL_TABLET | Freq: Every day | ORAL | 1 refills | Status: DC

## 2018-01-02 NOTE — Telephone Encounter (Signed)
Start in the morning. It needs to be taken in the morning as it is a stimulant.

## 2018-01-02 NOTE — Progress Notes (Signed)
Patient: Regina Pierce Female    DOB: 09-05-1955   62 y.o.   MRN: [id number] Visit Date: 01/02/2018  Today's Provider: Mar Daring, [name]   Chief Complaint  Patient presents with  . Depression   Subjective:    HPI  Depression: Depression: Patient complains of depression Worsening. She complains of depressed mood, difficulty concentrating, fatigue, hopelessness, insomnia and lonely,crying,agitation. She reports her depression worsening since July. She currently taking Lorazepam and Venlafaxine. She reports that her depression worsened initially in July because this is the season her work closes through July and August. She then went back to work and was told she was not going to be able to have a chair to sit whenever she needed and if she could not stand for the 8 hours they were going to lay her off. So after returning to work for less than 2 weeks she was then fired.   Depression screen [facility] 2/9 01/02/2018 10/28/2016 06/24/2016  Decreased Interest 3 3 1   Down, Depressed, Hopeless 3 3 0  PHQ - 2 Score 6 6 1   Altered sleeping 3 2 0  Tired, decreased energy 3 3 1   Change in appetite 3 3 0  Feeling bad or failure about yourself  3 1 0  Trouble concentrating 3 0 0  Moving slowly or fidgety/restless 3 0 0  Suicidal thoughts 0 0 0  PHQ-9 Score 24 15 2   Difficult doing work/chores Extremely dIfficult Somewhat difficult Not difficult at all   GAD 7 : Generalized Anxiety Score 01/02/2018  Nervous, Anxious, on Edge 3  Control/stop worrying 3  Worry too much - different things 3  Trouble relaxing 3  Restless 0  Easily annoyed or irritable 3  Afraid - awful might happen 0  Total GAD 7 Score 15  Anxiety Difficulty Extremely difficult      No Known Allergies   Current Outpatient Medications:  .  aspirin 81 MG tablet, Take 81 mg by mouth every evening. , Disp: , Rfl:  .  levothyroxine (SYNTHROID, LEVOTHROID) 150 MCG tablet, TAKE 1 TABLET BY MOUTH ONCE DAILY, Disp: 90 tablet,  Rfl: 1 .  lisinopril-hydrochlorothiazide (PRINZIDE,ZESTORETIC) 20-25 MG tablet, TAKE 1 TABLET BY MOUTH ONCE DAILY, Disp: 90 tablet, Rfl: 1 .  LORazepam (ATIVAN) 1 MG tablet, TAKE 1 TABLET BY MOUTH 2 TIMES DAILY AS NEEDED FOR ANXIETY, Disp: 60 tablet, Rfl: 0 .  metoprolol succinate (TOPROL-XL) 25 MG 24 hr tablet, Take 1 tablet (25 mg total) by mouth daily., Disp: 90 tablet, Rfl: 1 .  naproxen sodium (ANAPROX) 220 MG tablet, Take 440 mg by mouth daily as needed (pain)., Disp: , Rfl:  .  simvastatin (ZOCOR) 10 MG tablet, Take 1 tablet (10 mg total) by mouth at bedtime., Disp: 90 tablet, Rfl: 1 .  venlafaxine XR (EFFEXOR-XR) 75 MG 24 hr capsule, TAKE 3 CAPSULES BY MOUTH ONCE DAILY (TO EQUAL 225MG ), Disp: 90 capsule, Rfl: 5 .  acetaminophen (TYLENOL) 500 MG tablet, Take 1,000 mg by mouth every 6 (six) hours as needed for mild pain., Disp: , Rfl:   Review of Systems  Constitutional: Positive for activity change and fatigue.  Respiratory: Negative.   Cardiovascular: Negative.   Gastrointestinal: Negative.   Psychiatric/Behavioral: Positive for agitation, dysphoric mood and sleep disturbance. Negative for self-injury and suicidal ideas. The patient is nervous/anxious.     Social History   Tobacco Use  . Smoking status: Current Every Day Smoker    Packs/day: 1.00  Years: 30.00    Pack years: 30.00    Types: Cigarettes    Last attempt to quit: 01/24/2017    Years since quitting: 0.9  . Smokeless tobacco: Never Used  Substance Use Topics  . Alcohol use: No    Alcohol/week: 0.0 standard drinks   Objective:   BP 140/80 (BP Location: Left Arm, Patient Position: Sitting, Cuff Size: Normal)   Pulse 76   Temp 98.1 F (36.7 C) (Oral)   Resp 16   Wt 198 lb 9.6 oz (90.1 kg)   LMP 04/27/2007   SpO2 96%   BMI 34.09 kg/m  Vitals:   01/02/18 1443  BP: 140/80  Pulse: 76  Resp: 16  Temp: 98.1 F (36.7 C)  TempSrc: Oral  SpO2: 96%  Weight: 198 lb 9.6 oz (90.1 kg)     Physical Exam    Constitutional: She appears well-developed and well-nourished. No distress.  HENT:  Head: Normocephalic and atraumatic.  Right Ear: Hearing, tympanic membrane, external ear and ear canal normal.  Left Ear: Hearing, tympanic membrane, external ear and ear canal normal.  Nose: Right sinus exhibits maxillary sinus tenderness and frontal sinus tenderness. Left sinus exhibits maxillary sinus tenderness and frontal sinus tenderness.  Mouth/Throat: Uvula is midline, oropharynx is clear and moist and mucous membranes are normal. No oropharyngeal exudate.  Neck: Normal range of motion. Neck supple.  Cardiovascular: Normal rate, regular rhythm and normal heart sounds. Exam reveals no gallop and no friction rub.  No murmur heard. Pulmonary/Chest: Effort normal and breath sounds normal. No stridor. No respiratory distress. She has no wheezes. She has no rales.  Skin: She is not diaphoretic.  Psychiatric: Her speech is normal and behavior is normal. Judgment and thought content normal. Her mood appears anxious. Cognition and memory are normal. She exhibits a depressed mood (crying). She expresses no homicidal and no suicidal ideation.  Vitals reviewed.       Assessment & Plan:     1. Moderate episode of recurrent major depressive disorder ([place]) Will add wellbutrin as below to venlafaxine 225mg  she is already taking. I will see her back in 4 weeks to see how she is doing and make medication changes as needed. She is to call if symptoms worsen in the meantime.  - buPROPion (WELLBUTRIN XL) 150 MG 24 hr tablet; Take 1 tablet (150 mg total) by mouth daily.  Dispense: 30 tablet; Refill: 1  2. Anxiety Will increase to 3 tabs daily prn x 1 month while we make medication changes. She is aware once stable we will decrease this dose.  - LORazepam (ATIVAN) 1 MG tablet; TAKE 1 TABLET BY MOUTH 3 TIMES DAILY AS NEEDED FOR ANXIETY  Dispense: 90 tablet; Refill: 0  3. Encounter for smoking cessation  counseling Counseling lasted approx 5-6 minutes. Patient has been trying to quit smoking and was doing better until she was fired. She is interested in trying the nicotine patches. Will see how she is doing in 4 weeks.  - nicotine (NICODERM CQ - DOSED IN MG/24 HOURS) 21 mg/24hr patch; Place 1 patch (21 mg total) onto the skin daily.  Dispense: 28 patch; Refill: 0       Mar Daring, [name]  [facility]

## 2018-01-02 NOTE — Telephone Encounter (Signed)
Pt is asking if ok to take buPROPion (WELLBUTRIN XL) 150 MG 24 hr tablet tonight or should she wait and start the medication tomorrow with her AM medication? Pt request call back. Please advise. Thanks TNP

## 2018-01-02 NOTE — Telephone Encounter (Signed)
Please Review.  Thanks,  -Jeydi Klingel 

## 2018-01-02 NOTE — Patient Instructions (Signed)

## 2018-01-02 NOTE — Telephone Encounter (Signed)
Patient was advised. Expressed understanding.  

## 2018-01-09 ENCOUNTER — Telehealth: Payer: Self-pay | Admitting: Physician Assistant

## 2018-01-09 DIAGNOSIS — F331 Major depressive disorder, recurrent, moderate: Secondary | ICD-10-CM

## 2018-01-09 MED ORDER — BUPROPION HCL ER (XL) 300 MG PO TB24: 300.0000 mg | ORAL_TABLET | Freq: Every day | ORAL | 1 refills | Status: DC

## 2018-01-09 NOTE — Telephone Encounter (Signed)
Please Review

## 2018-01-09 NOTE — Telephone Encounter (Signed)
Pt stated that she has been taking buPROPion (WELLBUTRIN XL) 150 MG 24 hr tablet since 01/02/18 and hasn't noticed much change. Pt started crying and stated that she was ok but requested Regina Pierce return her call if possible. Pt stated that she was fine just embarrassed. Please advise. Thanks TNP

## 2018-01-09 NOTE — Telephone Encounter (Signed)
Gave instructions to taper venlafaxine. Will decrease to 2 tabs daily x 1 week, then to 1 tab daily x 1 week. Increase wellbutrin to 300mg  xr as below.

## 2018-01-11 ENCOUNTER — Telehealth: Payer: Self-pay | Admitting: Physician Assistant

## 2018-01-11 DIAGNOSIS — R232 Flushing: Secondary | ICD-10-CM

## 2018-01-11 DIAGNOSIS — R454 Irritability and anger: Secondary | ICD-10-CM

## 2018-01-11 NOTE — Telephone Encounter (Signed)
Pt is requesting Tawanna Sat return her call to discuss her depression. Please advise. Thanks TNP

## 2018-01-12 NOTE — Telephone Encounter (Signed)
Pt called back this morning regarding her call back from yesterday.  She wants to know if her depression could be hormonal.  She would like Sonia Baller to call her back asap.  thanks [employer]

## 2018-01-12 NOTE — Telephone Encounter (Signed)
Wants hormones checked. Labs ordered.

## 2018-01-15 LAB — ESTROGENS, TOTAL: Estrogen: 124 pg/mL

## 2018-01-15 LAB — FSH/LH
FSH: 76 m[IU]/mL
LH: 48.3 m[IU]/mL

## 2018-01-15 LAB — 17-HYDROXYPROGESTERONE: 17 HYDROXYPROGESTERONE: 26 ng/dL

## 2018-01-16 ENCOUNTER — Telehealth: Payer: Self-pay

## 2018-01-16 ENCOUNTER — Telehealth: Payer: Self-pay | Admitting: Physician Assistant

## 2018-01-16 DIAGNOSIS — E280 Estrogen excess: Secondary | ICD-10-CM

## 2018-01-16 DIAGNOSIS — R4586 Emotional lability: Secondary | ICD-10-CM

## 2018-01-16 NOTE — Telephone Encounter (Signed)
Pt husband actually called pleading for Corianna's lab results to be called to her.   He said she has had a horrible weekend.  One minute she is fine and the next minute she is crying her eyes out.  Cannot tell any difference since changing medicine.  Please call her ASAP.

## 2018-01-16 NOTE — Telephone Encounter (Signed)
Pt Called asking the the results from her labs on Thursday were back  Her CB# is [phone]  Thanks [employer]

## 2018-01-16 NOTE — Telephone Encounter (Signed)
Do not take 600mg  of bupropion! Max dose is 450mg . You are on XR so do not cut either. If oyu need the extra 150mg  I will send in a dose for you.  [place] ordered

## 2018-01-16 NOTE — Telephone Encounter (Signed)
See result note.  

## 2018-01-16 NOTE — Telephone Encounter (Signed)
Patient advised of lab results

## 2018-01-16 NOTE — Telephone Encounter (Signed)
° °  Pt asking if the office/ nurse can call her sister Bynum Bellows at [phone].  Pt stated it's ok to discuss her care with her sister. Pt is needing to lay down to rest. Pt wants her sister to talk to office and her sister can pick up any medication prescribed to her.  Thanks,  [employer]

## 2018-01-16 NOTE — Telephone Encounter (Signed)
Patient advised as below. Patient denies eating a lot of soy based products. Patient agrees to U/S. Patient reports she is taking bupropion 600 mg daily. And is now taking one tablet of venlafaxine. Patient also reports having to take lorazepam TID. Patient wants to know if any other medication will help with depression.

## 2018-01-16 NOTE — Telephone Encounter (Signed)
-----   Message from Mar Daring, [name] sent at 01/16/2018  3:49 PM EDT ----- [facility] and LH are c/w menopause, however estrogen is slightly elevated. Are you eating a lot of soy based products (edamame, soy milk, whey protein)? If not, I would recommend a pelvic and transvaginal US to r/o cyst or fibroid causing increased estrogen.

## 2018-01-17 ENCOUNTER — Telehealth: Payer: Self-pay | Admitting: Physician Assistant

## 2018-01-17 DIAGNOSIS — F321 Major depressive disorder, single episode, moderate: Secondary | ICD-10-CM

## 2018-01-17 MED ORDER — ESCITALOPRAM OXALATE 20 MG PO TABS: 20.0000 mg | ORAL_TABLET | Freq: Every day | ORAL | 0 refills | Status: DC

## 2018-01-17 NOTE — Telephone Encounter (Signed)
Patient advised as below. Patient verbalizes understanding and is in agreement with treatment plan.  

## 2018-01-17 NOTE — Telephone Encounter (Signed)
lmtcb

## 2018-01-17 NOTE — Telephone Encounter (Signed)
OK perfect she is to only be taking 300mg  wellbutrin (bupropion) daily. Venlafaxine she is tapering dose down. I think she is down to 1-75mg  capsule of venlafaxine at this time. What we can do is I will add in escitalopram (lexapro). I will send this in for her.

## 2018-01-17 NOTE — Telephone Encounter (Signed)
Pt returned and request call back. Pt stated if she doesn't answer please leave message about the medication she is supposed to be taking. Pt also stated that she is taking 2 tablets of buPROPion 150 mg to equal 300 mg a day. Pt stated she hasn't picked up the 300 mg tablets from the pharmacy yet. Please advise. Thanks TNP

## 2018-01-19 ENCOUNTER — Ambulatory Visit
Admission: RE | Admit: 2018-01-19 | Discharge: 2018-01-19 | Disposition: A | Discharge status: Home / Self Care | Payer: BLUE CROSS/BLUE SHIELD | Source: Ambulatory Visit | Attending: Physician Assistant | Admitting: Physician Assistant

## 2018-01-19 DIAGNOSIS — E280 Estrogen excess: Secondary | ICD-10-CM | POA: Diagnosis present

## 2018-01-19 DIAGNOSIS — R4586 Emotional lability: Secondary | ICD-10-CM

## 2018-01-19 DIAGNOSIS — D259 Leiomyoma of uterus, unspecified: Secondary | ICD-10-CM | POA: Insufficient documentation

## 2018-01-20 ENCOUNTER — Telehealth: Payer: Self-pay

## 2018-01-20 DIAGNOSIS — E280 Estrogen excess: Secondary | ICD-10-CM

## 2018-01-20 DIAGNOSIS — F419 Anxiety disorder, unspecified: Secondary | ICD-10-CM

## 2018-01-20 DIAGNOSIS — D252 Subserosal leiomyoma of uterus: Secondary | ICD-10-CM

## 2018-01-20 NOTE — Telephone Encounter (Signed)
Patient advised as directed below.  Per patient please refer to [facility].

## 2018-01-20 NOTE — Telephone Encounter (Signed)
Referral placed.

## 2018-01-20 NOTE — Telephone Encounter (Signed)
-----   Message from Mar Daring, [name] sent at 01/20/2018 11:46 AM EDT ----- There are 2 small (64mm) fibroid noted inside the uterus. No ovarian abnormality noted. Uterus is not abnormally thickened. Unsure of cause of labs. Could refer to GYN if patient desires further work up.

## 2018-01-20 NOTE — Telephone Encounter (Signed)
LMTCB  Thanks,  -Kaston Faughn 

## 2018-01-25 ENCOUNTER — Ambulatory Visit (INDEPENDENT_AMBULATORY_CARE_PROVIDER_SITE_OTHER): Payer: BLUE CROSS/BLUE SHIELD | Admitting: Obstetrics and Gynecology

## 2018-01-25 ENCOUNTER — Encounter: Payer: Self-pay | Admitting: Obstetrics and Gynecology

## 2018-01-25 VITALS — BP 153/76 | HR 75 | Ht 64.0 in | Wt 193.9 lb

## 2018-01-25 DIAGNOSIS — N951 Menopausal and female climacteric states: Secondary | ICD-10-CM

## 2018-01-25 DIAGNOSIS — F419 Anxiety disorder, unspecified: Secondary | ICD-10-CM | POA: Diagnosis not present

## 2018-01-25 DIAGNOSIS — F32A Depression, unspecified: Secondary | ICD-10-CM

## 2018-01-25 DIAGNOSIS — F329 Major depressive disorder, single episode, unspecified: Secondary | ICD-10-CM | POA: Diagnosis not present

## 2018-01-25 MED ORDER — ESTRADIOL-PROGESTERONE 1-100 MG PO CAPS: 1.0000 | ORAL_CAPSULE | Freq: Every day | ORAL | 3 refills | Status: DC

## 2018-01-25 NOTE — Progress Notes (Signed)
Pt is present today for hormone issues. Pt stated that she is having hot flashes, mood is unstable, crying and sad for no reason, hands and feet gets cold, a lot of sweating, and fog brain.

## 2018-01-25 NOTE — Progress Notes (Signed)
GYNECOLOGY PROGRESS NOTE  Subjective:    Patient ID: Regina Pierce, female    DOB: August 04, 1955, 62 y.o.   MRN: [id number]  HPI  Patient is a 62 y.o. G56P2002 female who presents for possible menopausal vasomotor symptoms. Notes that she was discussing her symptoms with her PCP as she is currently undergoing treatment for depression.  Over the past 2 months she has been having symptoms of hot flashes, unstable mood is unstable, crying and sad for no reason, hands and feet gets cold, a lot of sweating, and fog brain. She stated that she is having hot flashes, mood is unstable, crying and sad for no reason, hands and feet gets cold, a lot of sweating, and fog brain. Is currently undergoing treatment with Wellbutrin and Lexapro for depression symptoms (recently changed from Effexor), and has ativan as needed for anxiety.   She notes that her PCP checked her hormone levels, and that her estrogen level was high. Had an ultrasound that showed a small fibroid and normal appearing ovaries.   The following portions of the patient's history were reviewed and updated as appropriate: allergies, current medications, past family history, past medical history, past social history, past surgical history and problem list.  Review of Systems Pertinent items noted in HPI and remainder of comprehensive ROS otherwise negative.     Objective:   Blood pressure (!) 153/76, pulse 75, height 5\' 4"  (1.626 m), weight 193 lb 14.4 oz (88 kg), last menstrual period 04/27/2007. General appearance: alert, no distress and tearful Neurologic: Grossly normal  Psychologic: normal speech, normal thought content. Appears mildly anxious.    Labs:  Telephone on 01/11/2018  Component Date Value Ref Range Status  . Estrogen 01/12/2018 124  pg/mL Final   Comment:                             Prepubertal           <40                             Female Cycle:                               1-10 Days      61 - 394           11-20 Days     122 - 437                              21-30 Days     156 - 350                              Post-Menopausal      <40                           [facility] for Ovulation                              Induction:     400 - 800   . LH 01/12/2018 48.3  mIU/mL Final   Comment:  Adult Female:                       Follicular phase      2.4 -  12.6                       Ovulation phase      14.0 -  95.6                       Luteal phase          1.0 -  11.4                       Postmenopausal        7.7 -  58.5   . [facility] 01/12/2018 76.0  mIU/mL Final   Comment:                     Adult Female:                       Follicular phase      3.5 -  12.5                       Ovulation phase       4.7 -  21.5                       Luteal phase          1.7 -   7.7                       Postmenopausal       25.8 - 134.8   . 17-Hydroxyprogesterone 01/12/2018 26  ng/dL Final   Comment:                           Adult Female                            Follicular        15 -  70                            Luteal            35 - 290     Assessment:   Menopausal symptoms Depression and anxiety  Plan:   - Patient with bothersome menopausal symptoms. Discussed lifestyle interventions such as wearing light clothing, remaining in cool environments, having fan/air conditioner in the room, avoiding hot beverages etc.  Discussed using hormone therapy and concerns about increased risk of heart disease, cerebrovascular disease, thromboembolic disease,  and breast cancer.  Also discussed other medical options such as Paxil, Effexor, Brisdelle, Clonidine,  or Neurontin.   Also discussed alternative therapies such as herbal remedies but cautioned that most of the products contained phytoestrogens (plant estrogens) in unregulated amounts which can have the same effects on the body as the pharmaceutical estrogen preparations.  Also referred her to www.[email or web address].org for  other alternative options.  Patient opted for bio-identical combined estrogen/progestin hormone therapy. Bijuva prescribed. She will return in 4-6 weeksfor reevaluation.  Will also check estradiol and progesterone levels (noted an elevated estrogen level).  Discussed other causes of elevated estrogen including exogenous  intake (food, supplements), excess adipose tissue, ovarian masses/tumors (previously ruled out).  - Depression and anxiety symptoms, currently being managed by PCP. On Wellbutrin and Lexapro (just changed from Effexor) and using Ativan as needed for anxiety.  Hopefully menopausal symptoms can be improved, which may help her depression and anxiety as well.    A total of 15 minutes were spent face-to-face with the patient during this encounter and over half of that time dealt with counseling and coordination of care.   Rubie Maid, MD [facility]

## 2018-01-26 ENCOUNTER — Other Ambulatory Visit: Payer: Self-pay

## 2018-01-26 ENCOUNTER — Telehealth: Payer: Self-pay | Admitting: Obstetrics and Gynecology

## 2018-01-26 LAB — ESTRADIOL: Estradiol: <5 pg/mL

## 2018-01-26 LAB — PROGESTERONE: Progesterone: 0.1 ng/mL

## 2018-01-26 MED ORDER — NORETHINDRONE-ETH ESTRADIOL 0.5-2.5 MG-MCG PO TABS: 1.0000 | ORAL_TABLET | Freq: Every day | ORAL | 11 refills | Status: DC

## 2018-01-26 NOTE — Telephone Encounter (Signed)
The patient called and stated that she missed a call from her nurse and would like a call back if possible. Please advise.

## 2018-01-27 NOTE — Telephone Encounter (Signed)
Pt was called and informed that her medication that [facility] had prescribed was approved and she can pick medication up at her pharmacy.

## 2018-01-30 ENCOUNTER — Encounter: Payer: Self-pay | Admitting: Physician Assistant

## 2018-01-30 ENCOUNTER — Ambulatory Visit (INDEPENDENT_AMBULATORY_CARE_PROVIDER_SITE_OTHER): Payer: BLUE CROSS/BLUE SHIELD | Admitting: Physician Assistant

## 2018-01-30 ENCOUNTER — Other Ambulatory Visit: Payer: Self-pay | Admitting: Physician Assistant

## 2018-01-30 VITALS — BP 140/80 | HR 71 | Temp 98.9°F | Resp 16 | Wt 192.2 lb

## 2018-01-30 DIAGNOSIS — F321 Major depressive disorder, single episode, moderate: Secondary | ICD-10-CM

## 2018-01-30 DIAGNOSIS — F419 Anxiety disorder, unspecified: Secondary | ICD-10-CM

## 2018-01-30 MED ORDER — DESVENLAFAXINE SUCCINATE ER 100 MG PO TB24: 100.0000 mg | ORAL_TABLET | Freq: Every day | ORAL | 0 refills | Status: DC

## 2018-01-30 NOTE — Progress Notes (Signed)
Patient: Regina Pierce Female    DOB: Oct 25, 1955   62 y.o.   MRN: [id number] Visit Date: 01/30/2018  Today's Provider: Mar Daring, [name]   Chief Complaint  Patient presents with  . Depression  . Anxiety   Subjective:    HPI  Depression, Follow up:  The patient was last seen for Depression 4 weeks ago. Changes made since that visit include add wellbutrin to Venlafaxine 225mg    She reports excellent compliance with treatment. She is not having side effects. She reports that she is not doing any better.  She complains of depressed mood, difficulty concentrating, fatigue, insomnia and psychomotor agitation.  ------------------------------------------------------------------------ Anxiety: Patient here for 4 week follow-up.  She has the following symptoms: difficulty concentrating, fatigue, insomnia, irritable. Changes made since that visit include (Lorazepam) increase to 3 tabs daily prn x 1 month while we make medication changes. She is aware once stable we will decrease this dose. Patient reports that the increase of the Lorazepam is just a "cover up" she wants to feel better.   No Known Allergies   Current Outpatient Medications:  .  aspirin 81 MG tablet, Take 81 mg by mouth every evening. , Disp: , Rfl:  .  buPROPion (WELLBUTRIN XL) 300 MG 24 hr tablet, Take 1 tablet (300 mg total) by mouth daily., Disp: 30 tablet, Rfl: 1 .  escitalopram (LEXAPRO) 20 MG tablet, Take 1 tablet (20 mg total) by mouth daily., Disp: 30 tablet, Rfl: 0 .  Estradiol-Progesterone (BIJUVA) 1-100 MG CAPS, Take 1 tablet by mouth daily., Disp: 30 capsule, Rfl: 3 .  levothyroxine (SYNTHROID, LEVOTHROID) 150 MCG tablet, TAKE 1 TABLET BY MOUTH ONCE DAILY, Disp: 90 tablet, Rfl: 1 .  lisinopril-hydrochlorothiazide (PRINZIDE,ZESTORETIC) 20-25 MG tablet, TAKE 1 TABLET BY MOUTH ONCE DAILY, Disp: 90 tablet, Rfl: 1 .  LORazepam (ATIVAN) 1 MG tablet, TAKE 1 TABLET BY MOUTH 3 TIMES DAILY AS NEEDED FOR  ANXIETY, Disp: 90 tablet, Rfl: 0 .  metoprolol succinate (TOPROL-XL) 25 MG 24 hr tablet, Take 1 tablet (25 mg total) by mouth daily., Disp: 90 tablet, Rfl: 1 .  naproxen sodium (ANAPROX) 220 MG tablet, Take 440 mg by mouth daily as needed (pain)., Disp: , Rfl:  .  simvastatin (ZOCOR) 10 MG tablet, Take 1 tablet (10 mg total) by mouth at bedtime., Disp: 90 tablet, Rfl: 1 .  venlafaxine XR (EFFEXOR-XR) 75 MG 24 hr capsule, TAKE 3 CAPSULES BY MOUTH ONCE DAILY (TO EQUAL 225MG ), Disp: 90 capsule, Rfl: 5 .  acetaminophen (TYLENOL) 500 MG tablet, Take 1,000 mg by mouth every 6 (six) hours as needed for mild pain., Disp: , Rfl:  .  nicotine (NICODERM CQ - DOSED IN MG/24 HOURS) 21 mg/24hr patch, Place 1 patch (21 mg total) onto the skin daily. (Patient not taking: Reported on 01/30/2018), Disp: 28 patch, Rfl: 0  Review of Systems  Constitutional: Negative.   Respiratory: Negative.   Cardiovascular: Negative.   Psychiatric/Behavioral: Positive for agitation, decreased concentration and dysphoric mood. Negative for self-injury and suicidal ideas. The patient is nervous/anxious.     Social History   Tobacco Use  . Smoking status: Current Every Day Smoker    Packs/day: 1.00    Years: 30.00    Pack years: 30.00    Types: Cigarettes    Last attempt to quit: 01/24/2017    Years since quitting: 1.0  . Smokeless tobacco: Never Used  Substance Use Topics  . Alcohol use: No    Alcohol/week:  0.0 standard drinks   Objective:   BP 140/80 (BP Location: Left Arm, Patient Position: Sitting, Cuff Size: Normal)   Pulse 71   Temp 98.9 F (37.2 C) (Oral)   Resp 16   Wt 192 lb 3.2 oz (87.2 kg)   LMP 04/27/2007   SpO2 97%   BMI 32.99 kg/m  Vitals:   01/30/18 1528  BP: 140/80  Pulse: 71  Resp: 16  Temp: 98.9 F (37.2 C)  TempSrc: Oral  SpO2: 97%  Weight: 192 lb 3.2 oz (87.2 kg)     Physical Exam  Constitutional: She appears well-developed and well-nourished. No distress.  Neck: Normal range of  motion. Neck supple.  Cardiovascular: Normal rate, regular rhythm and normal heart sounds. Exam reveals no gallop and no friction rub.  No murmur heard. Pulmonary/Chest: Effort normal and breath sounds normal. No respiratory distress. She has no wheezes. She has no rales.  Skin: She is not diaphoretic.  Psychiatric: Her speech is normal and behavior is normal. Judgment and thought content normal. Her mood appears anxious. Cognition and memory are normal. She exhibits a depressed mood (crying uncontrollably).  Vitals reviewed.       Assessment & Plan:     1. Depression, major, single episode, moderate ([place]) Stop wellbutrin and lexapro. Start Pristiq as below. Referral made for counseling. I will see her back in 4 weeks.  - desvenlafaxine (PRISTIQ) 100 MG 24 hr tablet; Take 1 tablet (100 mg total) by mouth daily.  Dispense: 30 tablet; Refill: 0 - Ambulatory referral to Psychology       Mar Daring, [name]  [facility]

## 2018-01-30 NOTE — Patient Instructions (Signed)
Desvenlafaxine extended-release tablets What is this medicine? DESVENLAFAXINE (des VEN la FAX een) is used to treat depression. This medicine may be used for other purposes; ask your health care provider or pharmacist if you have questions. COMMON BRAND NAME(S): Khedezla, Pristiq What should I tell my health care provider before I take this medicine? They need to know if you have any of these conditions: -glaucoma -high blood pressure -kidney disease -liver disease -mania or bipolar disorder -suicidal thoughts or a previous suicide attempt -an unusual reaction to desvenlafaxine, venlafaxine, other medicines, foods, dyes, or preservatives -pregnant or trying to get pregnant -breast-feeding How should I use this medicine? Take this medicine by mouth with a drink of water. Do not crush, cut or chew. Follow the directions on the prescription label. Take your doses at regular intervals. Do not take your medicine more often than directed. Do not stop taking this medicine suddenly except upon the advice of your doctor. Stopping this medicine too quickly may cause serious side effects or your condition may worsen. Contact your pediatrician or health care professional regarding the use of this medicine in children. Special care may be needed. A special MedGuide will be given to you by the pharmacist with each prescription and refill. Be sure to read this information carefully each time. Overdosage: If you think you have taken too much of this medicine contact a poison control center or emergency room at once. NOTE: This medicine is only for you. Do not share this medicine with others. What if I miss a dose? If you miss a dose, take it as soon as you can. If it is almost time for your next dose, take only that dose. Do not take double or extra doses. What may interact with this medicine? Do not take this medicine with any of the following medications: -duloxetine -levomilnacipran -linezolid -MAOIs  like Carbex, Eldepryl, Marplan, Nardil, and Parnate -methylene blue (injected into a vein) -milnacipran -venlafaxine This medicine may also interact with the following medications: -alcohol -amphetamines -aspirin and aspirin-like medicines -certain migraine headache medicines (almotriptan, eletriptan, frovatriptan, naratriptan, rizatriptan, sumatriptan, zolmitriptan) -dexfenfluramine or fenfluramine -furazolidone -isoniazid -lithium -medicines for heart rhythm or blood pressure -medicines that treat or prevent blood clots like warfarin, enoxaparin, and dalteparin -methylphenidate -metoclopramide -NSAIDS, medicines for pain and inflammation, like ibuprofen or naproxen -pentazocine -phentermine -procarbazine -protriptyline -rasagiline -sibutramine -St. John's wort, Hypericum perforatum -tramadol -tryptophan -zolpidem This list may not describe all possible interactions. Give your health care provider a list of all the medicines, herbs, non-prescription drugs, or dietary supplements you use. Also tell them if you smoke, drink alcohol, or use illegal drugs. Some items may interact with your medicine. What should I watch for while using this medicine? Tell your doctor if your symptoms do not get better or if they get worse. Visit your doctor or health care professional for regular checks on your progress. Because it may take several weeks to see the full effects of this medicine, it is important to continue your treatment as prescribed by your doctor. Patients and their families should watch out for new or worsening thoughts of suicide or depression. Also watch out for sudden changes in feelings such as feeling anxious, agitated, panicky, irritable, hostile, aggressive, impulsive, severely restless, overly excited and hyperactive, or not being able to sleep. If this happens, especially at the beginning of treatment or after a change in dose, call your health care professional. This  medicine can cause an increase in blood pressure. Check with your doctor for   instructions on monitoring your blood pressure while taking this medicine. You may get drowsy or dizzy. Do not drive, use machinery, or do anything that needs mental alertness until you know how this medicine affects you. Do not stand or sit up quickly, especially if you are an older patient. This reduces the risk of dizzy or fainting spells. Alcohol may interfere with the effect of this medicine. Avoid alcoholic drinks. Your mouth may get dry. Chewing sugarless gum, sucking hard candy and drinking plenty of water will help. Contact your doctor if the problem does not go away or is severe. What side effects may I notice from receiving this medicine? Side effects that you should report to your doctor or health care professional as soon as possible: -allergic reactions like skin rash, itching or hives, swelling of the face, lips, or tongue -anxious -breathing problems -confusion -changes in vision -chest pain -confusion -elevated mood, decreased need for sleep, racing thoughts, impulsive behavior -eye pain -fast, irregular heartbeat -feeling faint or lightheaded, falls -feeling agitated, angry, or irritable -hallucination, loss of contact with reality -high blood pressure -loss of balance or coordination -palpitations -redness, blistering, peeling or loosening of the skin, including inside the mouth -restlessness, pacing, inability to keep still -seizures -stiff muscles -suicidal thoughts or other mood changes -trouble passing urine or change in the amount of urine -trouble sleeping -unusual bleeding or bruising -unusually weak or tired -vomiting Side effects that usually do not require medical attention (report to your doctor or health care professional if they continue or are bothersome): -change in sex drive or performance -change in appetite or weight -constipation -dizziness -dry  mouth -headache -increased sweating -nausea -tired This list may not describe all possible side effects. Call your doctor for medical advice about side effects. You may report side effects to FDA at 1-800-FDA-1088. Where should I keep my medicine? Keep out of the reach of children. Store at room temperature between 15 and 30 degrees C (59 and 86 degrees F). Throw away any unused medicine after the expiration date. NOTE: This sheet is a summary. It may not cover all possible information. If you have questions about this medicine, talk to your doctor, pharmacist, or health care provider.  2018 Elsevier/Gold Standard (2015-09-11 17:35:48)  

## 2018-02-24 ENCOUNTER — Encounter: Payer: BLUE CROSS/BLUE SHIELD | Admitting: Obstetrics and Gynecology

## 2018-02-24 ENCOUNTER — Encounter: Payer: Self-pay | Admitting: Obstetrics and Gynecology

## 2018-02-24 ENCOUNTER — Ambulatory Visit (INDEPENDENT_AMBULATORY_CARE_PROVIDER_SITE_OTHER): Payer: BLUE CROSS/BLUE SHIELD | Admitting: Obstetrics and Gynecology

## 2018-02-24 VITALS — BP 137/71 | HR 60 | Ht 64.0 in | Wt 199.4 lb

## 2018-02-24 DIAGNOSIS — F329 Major depressive disorder, single episode, unspecified: Secondary | ICD-10-CM

## 2018-02-24 DIAGNOSIS — F32A Depression, unspecified: Secondary | ICD-10-CM

## 2018-02-24 DIAGNOSIS — N951 Menopausal and female climacteric states: Secondary | ICD-10-CM | POA: Diagnosis not present

## 2018-02-24 DIAGNOSIS — F419 Anxiety disorder, unspecified: Secondary | ICD-10-CM | POA: Diagnosis not present

## 2018-02-24 NOTE — Progress Notes (Signed)
Pt is present today for a follow up appointment for hormone issues. Pt stated that the medication AC prescribed is working. Pt is wondering if it was depression or her hormones.  No other complaints.

## 2018-02-24 NOTE — Progress Notes (Signed)
    GYNECOLOGY PROGRESS NOTE  Subjective:    Patient ID: Regina Pierce, female    DOB: 1956/02/21, 62 y.o.   MRN: [id number]  HPI  Patient is a 62 y.o. G55P2002 female who presents for 1 month follow up of depression and anxiety vs menopausal changes.  She was started on Bijuva (bioidentical hormones) last visit,approxmately 1 month after she was also changed from her Effexor to Wellbutrin and Lexapro. She notes that after starting the hormones, her symptoms significantly improved after 5 days.  She is no longer emotional, tearful, and feels overall "good". Wonders if she needs to continue her depression medications.   The following portions of the patient's history were reviewed and updated as appropriate: allergies, current medications, past family history, past medical history, past social history, past surgical history and problem list.  Review of Systems Pertinent items noted in HPI and remainder of comprehensive ROS otherwise negative.   Objective:   Blood pressure 137/71, pulse 60, height 5\' 4"  (1.626 m), weight 199 lb 6.4 oz (90.4 kg), last menstrual period 04/27/2007. General appearance: alert and no distress Remainder of exam deferred.    Assessment:   Menopausal symptoms Depression and anxiety  Plan:   - Patient with improved symptoms on Bijuva, desires to continue.  Discussed increased risk of thrombo-embolic events such as myocardial infarction stroke and breast cancer after 4 or more years exposure to combination products with estrogen and progesterone. She understands the benefits as well as the risks discussed above. She  is advised that she may wish to discontinue the HRT at any time, ,and that we will assess yearly for opportunities to wean.  - Depression and anxiety - currently on Wellbutrin and Lexapro. Advised that she will need to discuss with her PCP whether or not she is able to come off the medications.  I advised her that she may be asked to remain on them for at  least a total of 3-6 months and then be able to wean, but this will be at the discretion of her PCP.  - RTC in 5 months for annual exam with pap (has h/o abnormal pap smear last year).  Rubie Maid, MD [facility]

## 2018-02-27 ENCOUNTER — Ambulatory Visit (INDEPENDENT_AMBULATORY_CARE_PROVIDER_SITE_OTHER): Payer: BLUE CROSS/BLUE SHIELD | Admitting: Physician Assistant

## 2018-02-27 ENCOUNTER — Encounter: Payer: Self-pay | Admitting: Physician Assistant

## 2018-02-27 ENCOUNTER — Other Ambulatory Visit: Payer: Self-pay | Admitting: Physician Assistant

## 2018-02-27 VITALS — BP 140/70 | HR 72 | Temp 98.2°F | Resp 16 | Wt 200.0 lb

## 2018-02-27 DIAGNOSIS — F325 Major depressive disorder, single episode, in full remission: Secondary | ICD-10-CM

## 2018-02-27 DIAGNOSIS — E7800 Pure hypercholesterolemia, unspecified: Secondary | ICD-10-CM

## 2018-02-27 DIAGNOSIS — I10 Essential (primary) hypertension: Secondary | ICD-10-CM | POA: Diagnosis not present

## 2018-02-27 DIAGNOSIS — E039 Hypothyroidism, unspecified: Secondary | ICD-10-CM | POA: Diagnosis not present

## 2018-02-27 DIAGNOSIS — F321 Major depressive disorder, single episode, moderate: Secondary | ICD-10-CM

## 2018-02-27 MED ORDER — LISINOPRIL-HYDROCHLOROTHIAZIDE 20-25 MG PO TABS: 1.0000 | ORAL_TABLET | Freq: Every day | ORAL | 1 refills | Status: DC

## 2018-02-27 MED ORDER — METOPROLOL SUCCINATE ER 25 MG PO TB24: 25.0000 mg | ORAL_TABLET | Freq: Every day | ORAL | 1 refills | Status: DC

## 2018-02-27 MED ORDER — DESVENLAFAXINE SUCCINATE ER 50 MG PO TB24: 50.0000 mg | ORAL_TABLET | Freq: Every day | ORAL | 1 refills | Status: DC

## 2018-02-27 MED ORDER — LEVOTHYROXINE SODIUM 150 MCG PO TABS: 150.0000 ug | ORAL_TABLET | Freq: Every day | ORAL | 1 refills | Status: DC

## 2018-02-27 MED ORDER — SIMVASTATIN 10 MG PO TABS: 10.0000 mg | ORAL_TABLET | Freq: Every day | ORAL | 1 refills | Status: DC

## 2018-02-27 NOTE — Progress Notes (Signed)
Patient: Regina Pierce Female    DOB: 09/26/1955   62 y.o.   MRN: [id number] Visit Date: 02/27/2018  Today's Provider: Mar Daring, [name]   Chief Complaint  Patient presents with  . Follow-up    Depression and Anxiety   Subjective:    HPI  Depression and Anxiety, Follow up:  The patient was last seen for this 4 weeks ago. Changes made since that visit include start Pristiq and counseling referral. She reports she feels it was more her hormone than the Depression. Reports that since she took her hormone medicine she started to feel better.  She reports excellent compliance with treatment. She is not having side effects. Patient reports that she is a lot better.  ------------------------------------------------------------------------  Depression screen [facility] 2/9 02/27/2018 01/02/2018 10/28/2016  Decreased Interest 0 3 3  Down, Depressed, Hopeless 0 3 3  PHQ - 2 Score 0 6 6  Altered sleeping 0 3 2  Tired, decreased energy 0 3 3  Change in appetite 0 3 3  Feeling bad or failure about yourself  0 3 1  Trouble concentrating 0 3 0  Moving slowly or fidgety/restless 0 3 0  Suicidal thoughts 0 0 0  PHQ-9 Score 0 24 15  Difficult doing work/chores Not difficult at all Extremely dIfficult Somewhat difficult   GAD 7 : Generalized Anxiety Score 02/27/2018 01/02/2018  Nervous, Anxious, on Edge 1 3  Control/stop worrying 0 3  Worry too much - different things 0 3  Trouble relaxing 0 3  Restless 1 0  Easily annoyed or irritable 0 3  Afraid - awful might happen 0 0  Total GAD 7 Score 2 15  Anxiety Difficulty Not difficult at all Extremely difficult       No Known Allergies   Current Outpatient Medications:  .  acetaminophen (TYLENOL) 500 MG tablet, Take 1,000 mg by mouth every 6 (six) hours as needed for mild pain., Disp: , Rfl:  .  aspirin 81 MG tablet, Take 81 mg by mouth every evening. , Disp: , Rfl:  .  desvenlafaxine (PRISTIQ) 100 MG 24 hr tablet, Take 1 tablet (100  mg total) by mouth daily., Disp: 30 tablet, Rfl: 0 .  Estradiol-Progesterone (BIJUVA) 1-100 MG CAPS, Take 1 tablet by mouth daily., Disp: 30 capsule, Rfl: 3 .  levothyroxine (SYNTHROID, LEVOTHROID) 150 MCG tablet, TAKE 1 TABLET BY MOUTH ONCE DAILY, Disp: 90 tablet, Rfl: 1 .  lisinopril-hydrochlorothiazide (PRINZIDE,ZESTORETIC) 20-25 MG tablet, TAKE 1 TABLET BY MOUTH ONCE DAILY, Disp: 90 tablet, Rfl: 1 .  LORazepam (ATIVAN) 1 MG tablet, TAKE 1 TABLET BY MOUTH 3 TIMES A DAY AS NEEDED FOR ANXEITY, Disp: 90 tablet, Rfl: 0 .  metoprolol succinate (TOPROL-XL) 25 MG 24 hr tablet, Take 1 tablet (25 mg total) by mouth daily., Disp: 90 tablet, Rfl: 1 .  naproxen sodium (ANAPROX) 220 MG tablet, Take 440 mg by mouth daily as needed (pain)., Disp: , Rfl:  .  simvastatin (ZOCOR) 10 MG tablet, Take 1 tablet (10 mg total) by mouth at bedtime., Disp: 90 tablet, Rfl: 1 .  nicotine (NICODERM CQ - DOSED IN MG/24 HOURS) 21 mg/24hr patch, Place 1 patch (21 mg total) onto the skin daily. (Patient not taking: Reported on 02/27/2018), Disp: 28 patch, Rfl: 0  Review of Systems  Constitutional: Negative.   Respiratory: Negative.   Cardiovascular: Negative.   Gastrointestinal: Negative.   Neurological: Negative.   Psychiatric/Behavioral: Negative.     Social History  Tobacco Use  . Smoking status: Current Every Day Smoker    Packs/day: 1.00    Years: 30.00    Pack years: 30.00    Types: Cigarettes    Last attempt to quit: 01/24/2017    Years since quitting: 1.0  . Smokeless tobacco: Never Used  Substance Use Topics  . Alcohol use: No    Alcohol/week: 0.0 standard drinks   Objective:   BP 140/70 (BP Location: Left Arm, Patient Position: Sitting, Cuff Size: Normal)   Pulse 72   Temp 98.2 F (36.8 C) (Oral)   Resp 16   Wt 200 lb (90.7 kg)   LMP 04/27/2007   BMI 34.33 kg/m  Vitals:   02/27/18 1643  BP: 140/70  Pulse: 72  Resp: 16  Temp: 98.2 F (36.8 C)  TempSrc: Oral  Weight: 200 lb (90.7 kg)      Physical Exam  Constitutional: She appears well-developed and well-nourished.  HENT:  Head: Normocephalic and atraumatic.  Eyes: EOM are normal.  Neck: Normal range of motion. Neck supple.  Pulmonary/Chest: Effort normal. No respiratory distress.  Psychiatric: She has a normal mood and affect. Her behavior is normal. Judgment and thought content normal.  Vitals reviewed.       Assessment & Plan:     1. Major depressive disorder with single episode, in full remission ([place]) Completely improved. Will decrease Pristiq to 50mg  and slowly taper down. Continue hormone replacement as prescribed by GYN.  - desvenlafaxine (PRISTIQ) 50 MG 24 hr tablet; Take 1 tablet (50 mg total) by mouth daily.  Dispense: 90 tablet; Refill: 1  2. Hypothyroidism, unspecified type Stable. Diagnosis pulled for medication refill. Continue current medical treatment plan. - levothyroxine (SYNTHROID, LEVOTHROID) 150 MCG tablet; Take 1 tablet (150 mcg total) by mouth daily.  Dispense: 90 tablet; Refill: 1  3. Essential hypertension Stable. Diagnosis pulled for medication refill. Continue current medical treatment plan. - lisinopril-hydrochlorothiazide (PRINZIDE,ZESTORETIC) 20-25 MG tablet; Take 1 tablet by mouth daily.  Dispense: 90 tablet; Refill: 1 - metoprolol succinate (TOPROL-XL) 25 MG 24 hr tablet; Take 1 tablet (25 mg total) by mouth daily.  Dispense: 90 tablet; Refill: 1  4. Pure hypercholesterolemia Stable. Diagnosis pulled for medication refill. Continue current medical treatment plan. - simvastatin (ZOCOR) 10 MG tablet; Take 1 tablet (10 mg total) by mouth at bedtime.  Dispense: 90 tablet; Refill: [place], [name]  [facility]

## 2018-02-28 ENCOUNTER — Other Ambulatory Visit: Payer: Self-pay | Admitting: Physician Assistant

## 2018-02-28 DIAGNOSIS — Z1231 Encounter for screening mammogram for malignant neoplasm of breast: Secondary | ICD-10-CM

## 2018-03-06 ENCOUNTER — Telehealth: Payer: Self-pay | Admitting: Obstetrics and Gynecology

## 2018-03-06 ENCOUNTER — Telehealth: Payer: Self-pay

## 2018-03-06 NOTE — Telephone Encounter (Signed)
The patient states the pharmacy sent over a prior authorization for this medication, and she has not heard anything back, and she only has 2 more days of it.  She is asking if her nurse can call her back today and let her know the status on her refill, please advise, thanks.

## 2018-03-06 NOTE — Telephone Encounter (Signed)
Called pharmacy to check on the pt's prior authorization. Pharmacy stated that the pt has a discount card for 50.00.

## 2018-03-07 NOTE — Telephone Encounter (Signed)
Pt was called to speak with her more about her medication after speaking to the pharmacy. Pt stated that she paid the payment of 50.00, but pt wanted to know if she could it for 35.00. Pt was advised that I would contact the pharmacy again and speak with them about the discount card that the pt has. Pt was advised that she would receive a call after speaking with the pharmacy.

## 2018-03-09 NOTE — Telephone Encounter (Signed)
Spoke with pharmacy to send a prior authorization form to be completed so the pt can use her discount card and get her medication for 35.00.  The pharmacy will be faxing prior authorization form to the office for the pt.

## 2018-03-09 NOTE — Telephone Encounter (Signed)
Spoke with pt to inform her that I had spoke with the pharmacy and they would be faxing over a prior authorization form to the office to be completed and faxed in. Pt stated that she was okay with that.

## 2018-03-27 ENCOUNTER — Telehealth: Payer: Self-pay | Admitting: Physician Assistant

## 2018-03-27 DIAGNOSIS — F321 Major depressive disorder, single episode, moderate: Secondary | ICD-10-CM

## 2018-03-27 MED ORDER — DESVENLAFAXINE SUCCINATE ER 100 MG PO TB24: 100.0000 mg | ORAL_TABLET | Freq: Every day | ORAL | 1 refills | Status: DC

## 2018-03-27 NOTE — Telephone Encounter (Signed)
Pt wanted to let Tawanna Sat know on Mon, Nov 25th she went back up to 100 mg on  desvenlafaxine (PRISTIQ) 50 MG 24 hr tablet     Thanks, [place]

## 2018-03-27 NOTE — Telephone Encounter (Signed)
Sent in pristiq 100mg 

## 2018-03-28 ENCOUNTER — Other Ambulatory Visit: Payer: Self-pay | Admitting: Physician Assistant

## 2018-03-28 DIAGNOSIS — Z716 Tobacco abuse counseling: Secondary | ICD-10-CM

## 2018-04-03 ENCOUNTER — Telehealth: Payer: Self-pay

## 2018-04-03 MED ORDER — ESTRADIOL-LEVONORGESTREL 0.045-0.015 MG/DAY TD PTWK: 1.0000 | MEDICATED_PATCH | TRANSDERMAL | 12 refills | Status: DC

## 2018-04-03 NOTE — Telephone Encounter (Signed)
Pt called and informed that [facility] had recommended that she take Climara Pro 0.045/0.015mg  patches weekly. Pt is aware that her medication would be sent to [place].

## 2018-04-03 NOTE — Telephone Encounter (Signed)
Please see another phone encounter.  

## 2018-04-03 NOTE — Telephone Encounter (Signed)
Pt called and stated that the medication bijuva is not working and she wondering if she could get a patch called in or if we had any samples in the office. Pt stated that she really needed something ASAP. Pt was advised that I had to speak to [facility] before any medication could be given. Pt stated that she understood but wanted an answer today. Pt was advised that due to [facility] being in surgery today and not in clinical that I would have to send her a message and it may not be today. Pt was informed that [facility] would be in the office in the morning and I could inform her then. Pt stated that she understood. Message was sent to [place] her of the pt's issues.

## 2018-04-04 ENCOUNTER — Telehealth: Payer: Self-pay

## 2018-04-04 NOTE — Telephone Encounter (Signed)
Called the pharmacy to see what the pt concerns were about. Pharmacy stated that the pt picked up her medication today, but pt had to used a coupon to be able to get the medication at an affordable price due to her insurance would not cover the medication. Pt needs a prior autho to cover the medication.

## 2018-04-04 NOTE — Telephone Encounter (Signed)
Pt needs pa done on Climara. She states [facility] sent it to [place] Friday. Pls advise.

## 2018-04-05 NOTE — Telephone Encounter (Signed)
Pt called no answer LM via voicemail that I had spoke with the pharmacy yesterday and got more information about the medication Climara and what needed to be done to do a prior authto get her medication at the discount price.

## 2018-04-25 ENCOUNTER — Telehealth: Payer: Self-pay

## 2018-04-25 NOTE — Telephone Encounter (Signed)
Pt called stated that the new medication is not working and is not covered by her insurance. Pt wants to know what else could she do? Pt was crying very emotionally stated that she didn't know what she was going to do. Pt wanted to speak to you but was informed that [facility] had left for the day, but a message would be sent to her concerning her situation. Pt stated that it was fine. Pt is aware that the office will be closed tomorrow and will open back up on Thursday, Apr 27, 2018.

## 2018-04-27 ENCOUNTER — Other Ambulatory Visit: Payer: Self-pay

## 2018-04-27 MED ORDER — NORETHINDRONE-ETH ESTRADIOL 0.5-2.5 MG-MCG PO TABS: 1.0000 | ORAL_TABLET | Freq: Every day | ORAL | 1 refills | Status: DC

## 2018-04-27 NOTE — Telephone Encounter (Signed)
Called pt no answer LM via voicemail that [facility] had prescribed Femhrt 0.5mg /2.20mcg and it was sent to [facility] and if she had any questions or concerns to call the office.

## 2018-04-28 ENCOUNTER — Telehealth: Payer: Self-pay

## 2018-04-28 NOTE — Telephone Encounter (Signed)
Pt was called to see if she received the voicemail that was left on 04/27/18 concerning the issues she was having with her medication. Pt stated that she did not receive the voicemail, but was informed that [facility] changed her medication and wanted her to try Femhrt and if she did not see any changes in her mood and hot flashes to make an appointment to see [facility]. Pt stated that she understood.

## 2018-05-01 ENCOUNTER — Telehealth: Payer: Self-pay | Admitting: Physician Assistant

## 2018-05-01 ENCOUNTER — Telehealth: Payer: Self-pay | Admitting: Obstetrics and Gynecology

## 2018-05-01 DIAGNOSIS — F325 Major depressive disorder, single episode, in full remission: Secondary | ICD-10-CM

## 2018-05-01 NOTE — Telephone Encounter (Signed)
The patient called and stated that she needs to speak with  Luana Shu if possible today. Please advise.

## 2018-05-01 NOTE — Telephone Encounter (Signed)
Pt wanting to know how to decrease her desvenlafaxine (PRISTIQ) 100 MG 24 hr tablet to eventually get off of Rx.  Please advise,  Thanks, [place]

## 2018-05-01 NOTE — Telephone Encounter (Signed)
Please advise. KW 

## 2018-05-02 MED ORDER — DESVENLAFAXINE SUCCINATE ER 50 MG PO TB24: 50.0000 mg | ORAL_TABLET | Freq: Every day | ORAL | 0 refills | Status: DC

## 2018-05-02 NOTE — Telephone Encounter (Signed)
Sent in the 50mg  tab for her to switch to. Take this for 2 weeks then 1 tab every other day until completed, then stop medication.

## 2018-05-02 NOTE — Telephone Encounter (Signed)
Patient was advised. Patient stated she has a lot of the 100 mg left. She states she will half the 100 mg and follow instructions below.

## 2018-05-02 NOTE — Telephone Encounter (Signed)
She cannot cut the 100mg  in half unfortunately which is why I sent in the 50mg . They are extended release medications

## 2018-05-02 NOTE — Telephone Encounter (Signed)
Pt call was returned and spoke with pt concerning her call to office. Pt wanted to know how long it would be before the new medication Femhrt would start taking effect. Spoke with [facility] and she stated that it would take 1-2 weeks. Pt was informed and was advised if she did not see any improvement in 2 weeks to call and make an appointment to be seen by [facility].

## 2018-05-02 NOTE — Telephone Encounter (Signed)
Patient was advised.  

## 2018-05-19 ENCOUNTER — Encounter: Payer: Self-pay | Admitting: Obstetrics and Gynecology

## 2018-05-19 ENCOUNTER — Ambulatory Visit: Payer: BLUE CROSS/BLUE SHIELD | Admitting: Obstetrics and Gynecology

## 2018-05-19 VITALS — BP 120/75 | HR 76 | Ht 64.0 in | Wt 186.1 lb

## 2018-05-19 DIAGNOSIS — Z79890 Hormone replacement therapy (postmenopausal): Secondary | ICD-10-CM | POA: Diagnosis not present

## 2018-05-19 DIAGNOSIS — F3289 Other specified depressive episodes: Secondary | ICD-10-CM | POA: Diagnosis not present

## 2018-05-19 MED ORDER — QUETIAPINE FUMARATE 50 MG PO TABS: 50.0000 mg | ORAL_TABLET | Freq: Every day | ORAL | 1 refills | Status: DC

## 2018-05-19 NOTE — Progress Notes (Signed)
Pt is having hormone issues that is not being covered by medication. Pt stated that she is crying a lot, tired, not energy and no desire to do anything like eat or do daily household tasks. Pt stated that her feet and hands are always cold.

## 2018-05-21 ENCOUNTER — Encounter: Payer: Self-pay | Admitting: Obstetrics and Gynecology

## 2018-05-21 NOTE — Progress Notes (Signed)
GYNECOLOGY PROGRESS NOTE  Subjective:    Patient ID: Regina Pierce, female    DOB: Aug 13, 1955, 63 y.o.   MRN: [id number]  HPI  Patient is a 63 y.o. [id number] postmenopausal female with a h/o depression who presents for further management of mood symptoms (sadness, tearfulness, decreased appetite, irritability).  She is currently on HRT with FemHrt.  She has been tried on several other HRT medications including Climara Pro and Bijuva, for which she will note temporary relief of mood symptoms, but returns ~ 2-3 weeks later.  She notes that she is not having any problems with her hot flushes or night sweats. She has also been seen by her PCP, who has changed her from Wellbutrin and Lexapro, to currently on Pristiq (however patient notes she is taking it every other day instead of every day; states she did not feel much difference when she was taking it every day either).   Current Outpatient Medications on File Prior to Visit  Medication Sig Dispense Refill  . acetaminophen (TYLENOL) 500 MG tablet Take 1,000 mg by mouth every 6 (six) hours as needed for mild pain.    Marland Kitchen aspirin 81 MG tablet Take 81 mg by mouth every evening.     . desvenlafaxine (PRISTIQ) 50 MG 24 hr tablet Take 1 tablet (50 mg total) by mouth daily. 21 tablet 0  . levothyroxine (SYNTHROID, LEVOTHROID) 150 MCG tablet Take 1 tablet (150 mcg total) by mouth daily. 90 tablet 1  . lisinopril-hydrochlorothiazide (PRINZIDE,ZESTORETIC) 20-25 MG tablet Take 1 tablet by mouth daily. 90 tablet 1  . LORazepam (ATIVAN) 1 MG tablet TAKE 1 TABLET BY MOUTH 3 TIMES A DAY AS NEEDED FOR ANXEITY 90 tablet 0  . metoprolol succinate (TOPROL-XL) 25 MG 24 hr tablet Take 1 tablet (25 mg total) by mouth daily. 90 tablet 1  . naproxen sodium (ANAPROX) 220 MG tablet Take 440 mg by mouth daily as needed (pain).    . nicotine (NICODERM CQ - DOSED IN MG/24 HOURS) 21 mg/24hr patch PLACE ONE PATCH (21 MG TOTAL) ONTO THE SKIN DAILY. 28 patch 1  .  norethindrone-ethinyl estradiol (FEMHRT LOW DOSE) 0.5-2.5 MG-MCG tablet Take 1 tablet by mouth daily. 30 tablet 1  . simvastatin (ZOCOR) 10 MG tablet Take 1 tablet (10 mg total) by mouth at bedtime. 90 tablet 1   No current facility-administered medications on file prior to visit.      The following portions of the patient's history were reviewed and updated as appropriate: allergies, current medications, past family history, past medical history, past social history, past surgical history and problem list.  Review of Systems Pertinent items noted in HPI and remainder of comprehensive ROS otherwise negative.   Objective:   Blood pressure 120/75, pulse 76, height 5\' 4"  (1.626 m), weight 186 lb 1.6 oz (84.4 kg), last menstrual period 04/27/2007. General appearance: alert and no distress Psychologic: Normal speech, depressed mood, normal affect. Denies SI, HI.    Assessment:   Depression Menopausal, on HRT  Plan:   - Patient with persistent depression symptoms, has been tried on several HRT medications in addition to anti-depressant medications. Discussed with patient regarding resuming her Pristiq at prescribed dosing (daily, instead of every other day), and increasing dose as needed, restarting Lexapro as she had only been on the medication for 1 month before discontinuing as at the time she felt her symptoms were improving, referral for counseling.  After further discussion of her symptoms, questioning if this is an atypical form  of depression.  Discussed other option of beginning a trial of medication of Seroquel.  At this time, I do not believe that her mood symptoms are still a product of her menopausal symptoms as the remainder of her symptoms are controlled, and increasing dose at this time would not be necessary. Strongly encouraged therapy, patient notes she will think about it.  - patient to f/u in 3-4 weeks.    A total of 15 minutes were spent face-to-face with the patient  during this encounter and over half of that time dealt with counseling and coordination of care.   Rubie Maid, MD [facility]

## 2018-06-16 ENCOUNTER — Other Ambulatory Visit: Payer: Self-pay | Admitting: Obstetrics and Gynecology

## 2018-06-16 ENCOUNTER — Other Ambulatory Visit: Payer: Self-pay | Admitting: Physician Assistant

## 2018-06-16 DIAGNOSIS — F419 Anxiety disorder, unspecified: Secondary | ICD-10-CM

## 2018-06-16 NOTE — Telephone Encounter (Signed)
Pt called to see if the medication Seroquel 50 mg. Pt stated that she think the medication is working but she still feel nervous a lot still. Pt stated she still wanted a refill of the medication to take to see if there will be any changes.

## 2018-06-16 NOTE — Telephone Encounter (Signed)
Please see how she is doing on this medication before we refill. If she is doing well, she can get 6 refills.

## 2018-07-03 ENCOUNTER — Ambulatory Visit
Admission: RE | Admit: 2018-07-03 | Discharge: 2018-07-03 | Disposition: A | Discharge status: Home / Self Care | Payer: BLUE CROSS/BLUE SHIELD | Source: Ambulatory Visit | Attending: Physician Assistant | Admitting: Physician Assistant

## 2018-07-03 ENCOUNTER — Ambulatory Visit (INDEPENDENT_AMBULATORY_CARE_PROVIDER_SITE_OTHER): Payer: BLUE CROSS/BLUE SHIELD | Admitting: Physician Assistant

## 2018-07-03 ENCOUNTER — Encounter: Payer: Self-pay | Admitting: Physician Assistant

## 2018-07-03 ENCOUNTER — Other Ambulatory Visit: Payer: Self-pay

## 2018-07-03 VITALS — BP 130/72 | HR 75 | Temp 98.3°F | Resp 16 | Ht 64.5 in | Wt 183.1 lb

## 2018-07-03 DIAGNOSIS — Z1231 Encounter for screening mammogram for malignant neoplasm of breast: Secondary | ICD-10-CM | POA: Insufficient documentation

## 2018-07-03 DIAGNOSIS — Z23 Encounter for immunization: Secondary | ICD-10-CM | POA: Diagnosis not present

## 2018-07-03 DIAGNOSIS — Z0000 Encounter for general adult medical examination without abnormal findings: Secondary | ICD-10-CM | POA: Diagnosis not present

## 2018-07-03 DIAGNOSIS — E7800 Pure hypercholesterolemia, unspecified: Secondary | ICD-10-CM | POA: Diagnosis not present

## 2018-07-03 DIAGNOSIS — Z114 Encounter for screening for human immunodeficiency virus [HIV]: Secondary | ICD-10-CM

## 2018-07-03 DIAGNOSIS — E119 Type 2 diabetes mellitus without complications: Secondary | ICD-10-CM | POA: Diagnosis not present

## 2018-07-03 DIAGNOSIS — E039 Hypothyroidism, unspecified: Secondary | ICD-10-CM | POA: Diagnosis not present

## 2018-07-03 DIAGNOSIS — I10 Essential (primary) hypertension: Secondary | ICD-10-CM

## 2018-07-03 DIAGNOSIS — Z2821 Immunization not carried out because of patient refusal: Secondary | ICD-10-CM

## 2018-07-03 NOTE — Progress Notes (Signed)
Patient: Regina Pierce, Female    DOB: 08-30-1955, 63 y.o.   MRN: [id number] Visit Date: 07/03/2018  Today's Provider: Mar Daring, [name]   Chief Complaint  Patient presents with  . Annual Exam   Subjective:     Annual physical exam Regina Pierce is a 63 y.o. female who presents today for health maintenance and complete physical. She feels well. She reports exercising daily 30 minutes . She reports she is sleeping well.  07/01/17 CPE 07/14/17 Pap-ASCUS, HPV-negative (Dr. Marcelline Mates) 07/01/17 Mammogram-BI-RADS 1; scheduled today 07/20/16 colonoscopy-polyps, diverticulosis. Colon polyps Pathology-Tubular adenoma. Rectum polys-hyperplastic polyps-repeat in 5 years -----------------------------------------------------------------   Review of Systems  Constitutional: Negative.   HENT: Negative.   Eyes: Negative.   Respiratory: Negative.   Cardiovascular: Negative.   Gastrointestinal: Negative.   Endocrine: Negative.   Genitourinary: Negative.   Musculoskeletal: Negative.   Skin: Negative.   Allergic/Immunologic: Negative.   Neurological: Negative.   Hematological: Negative.   Psychiatric/Behavioral: Negative.     Social History      She  reports that she has been smoking cigarettes. She has a 30.00 pack-year smoking history. She has never used smokeless tobacco. She reports that she does not drink alcohol or use drugs.       Social History   Socioeconomic History  . Marital status: Married    Spouse name: Not on file  . Number of children: Not on file  . Years of education: Not on file  . Highest education level: Not on file  Occupational History  . Not on file  Social Needs  . Financial resource strain: Not on file  . Food insecurity:    Worry: Not on file    Inability: Not on file  . Transportation needs:    Medical: Not on file    Non-medical: Not on file  Tobacco Use  . Smoking status: Current Every Day Smoker    Packs/day: 1.00    Years: 30.00     Pack years: 30.00    Types: Cigarettes    Last attempt to quit: 01/24/2017    Years since quitting: 1.4  . Smokeless tobacco: Never Used  Substance and Sexual Activity  . Alcohol use: No    Alcohol/week: 0.0 standard drinks  . Drug use: No  . Sexual activity: Not Currently    Birth control/protection: Post-menopausal  Lifestyle  . Physical activity:    Days per week: Not on file    Minutes per session: Not on file  . Stress: Not on file  Relationships  . Social connections:    Talks on phone: Not on file    Gets together: Not on file    Attends religious service: Not on file    Active member of club or organization: Not on file    Attends meetings of clubs or organizations: Not on file    Relationship status: Not on file  Other Topics Concern  . Not on file  Social History Narrative  . Not on file    Past Medical History:  Diagnosis Date  . Anxiety   . Depression   . Diabetes mellitus without complication (HCC)    no meds  . Hyperlipidemia   . Hypertension   . Thyroid disease      Patient Active Problem List   Diagnosis Date Noted  . Low grade squamous intraepithelial lesion (LGSIL) on cervical Pap smear 06/29/2016  . Pap smear abnormality of cervix/human papillomavirus (HPV) positive 06/29/2016  .  Type 2 diabetes mellitus without complications ([place]) 02/63/7858  . Tobacco abuse 05/13/2015  . Polyp of colon 01/14/2015  . 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 Hyperlipidemia, unspecified 01/14/2015  . 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 Cyst of ovary 01/14/2015  . Diaphragmatic hernia without obstruction or gangrene 01/14/2015  . Major depressive disorder, single episode 01/14/2015  . Anxiety 01/13/2015  . BP (high blood pressure) 01/13/2015  . Hypothyroidism 01/13/2015  . Depression 11/05/2014    Past Surgical History:  Procedure Laterality Date  . ACHILLES TENDON SURGERY Left 05/12/2016   Procedure: ACHILLES TENDON REPAIR;   Surgeon: Samara Deist, DPM;  Location: [facility] ORS;  Service: Podiatry;  Laterality: Left;  . BONE EXOSTOSIS EXCISION Left 05/12/2016   Procedure: CALCANEAL EXOSTECTOMY AND DORSAL EXOSTECTOMY;  Surgeon: Samara Deist, DPM;  Location: [facility] ORS;  Service: Podiatry;  Laterality: Left;  . COLONOSCOPY WITH PROPOFOL N/A 07/20/2016   Procedure: COLONOSCOPY WITH PROPOFOL;  Surgeon: Jonathon Bellows, MD;  Location: [facility] ENDOSCOPY;  Service: Endoscopy;  Laterality: N/A;  . HAND SURGERY Bilateral 1999   carpal tunnel   . KNEE SURGERY  2010  . TUBAL LIGATION  1987    Family History        Family Status  Relation Name Status  . Mother  Deceased  . Father  Deceased  . Sister  Deceased at age 67       heart defect  . Brother  Alive  . Sister  Deceased       [place] accident  . Sister  Alive        Her family history includes Hypertension in her sister.      No Known Allergies   Current Outpatient Medications:  .  aspirin 81 MG tablet, Take 81 mg by mouth every evening. , Disp: , Rfl:  .  levothyroxine (SYNTHROID, LEVOTHROID) 150 MCG tablet, Take 1 tablet (150 mcg total) by mouth daily., Disp: 90 tablet, Rfl: 1 .  lisinopril-hydrochlorothiazide (PRINZIDE,ZESTORETIC) 20-25 MG tablet, Take 1 tablet by mouth daily., Disp: 90 tablet, Rfl: 1 .  LORazepam (ATIVAN) 1 MG tablet, TAKE 1 TABLET BY MOUTH 3 TIMES A DAY AS NEEDED FOR ANXIETY, Disp: 90 tablet, Rfl: 5 .  metoprolol succinate (TOPROL-XL) 25 MG 24 hr tablet, Take 1 tablet (25 mg total) by mouth daily., Disp: 90 tablet, Rfl: 1 .  naproxen sodium (ANAPROX) 220 MG tablet, Take 440 mg by mouth daily as needed (pain)., Disp: , Rfl:  .  norethindrone-ethinyl estradiol (FEMHRT LOW DOSE) 0.5-2.5 MG-MCG tablet, Take 1 tablet by mouth daily., Disp: 30 tablet, Rfl: 1 .  QUEtiapine (SEROQUEL) 50 MG tablet, TAKE 1 TABLET BY MOUTH EVERY NIGHT AT BEDTIME, Disp: 30 tablet, Rfl: 6 .  simvastatin (ZOCOR) 10 MG tablet, Take 1 tablet (10 mg total) by mouth at bedtime.,  Disp: 90 tablet, Rfl: 1 .  acetaminophen (TYLENOL) 500 MG tablet, Take 1,000 mg by mouth every 6 (six) hours as needed for mild pain., Disp: , Rfl:  .  nicotine (NICODERM CQ - DOSED IN MG/24 HOURS) 21 mg/24hr patch, PLACE ONE PATCH (21 MG TOTAL) ONTO THE SKIN DAILY. (Patient not taking: Reported on 07/03/2018), Disp: 28 patch, Rfl: 1   Patient Care Team: Mar Daring, [name] as PCP - General (Family Medicine)    Objective:    Vitals: BP 130/72 (BP Location: Left Arm, Patient Position: Sitting, Cuff Size: Normal)   Pulse 75   Temp 98.3 F (36.8 C) (Oral)   Resp 16  Ht 5' 4.5" (1.638 m)   Wt 183 lb 1.6 oz (83.1 kg)   LMP 04/27/2007   SpO2 97%   BMI 30.94 kg/m    Vitals:   07/03/18 1022  BP: 130/72  Pulse: 75  Resp: 16  Temp: 98.3 F (36.8 C)  TempSrc: Oral  SpO2: 97%  Weight: 183 lb 1.6 oz (83.1 kg)  Height: 5' 4.5" (1.638 m)     Physical Exam Vitals signs reviewed.  Constitutional:      Appearance: Normal appearance. She is well-developed. She is obese.  HENT:     Head: Normocephalic and atraumatic.     Right Ear: Tympanic membrane, ear canal and external ear normal.     Left Ear: Tympanic membrane, ear canal and external ear normal.     Nose: Nose normal.     Mouth/Throat:     Pharynx: Uvula midline.  Eyes:     General: Lids are normal.     Conjunctiva/sclera: Conjunctivae normal.     Pupils: Pupils are equal, round, and reactive to light.  Neck:     Musculoskeletal: Normal range of motion and neck supple.     Thyroid: No thyroid mass or thyromegaly.     Vascular: No carotid bruit.     Trachea: Trachea normal.  Cardiovascular:     Rate and Rhythm: Normal rate and regular rhythm.     Heart sounds: Normal heart sounds.  Pulmonary:     Effort: Pulmonary effort is normal.     Breath sounds: Normal breath sounds.  Abdominal:     General: Bowel sounds are normal.     Palpations: Abdomen is soft.     Tenderness: There is no abdominal tenderness.    Musculoskeletal: Normal range of motion.  Lymphadenopathy:     Cervical: No cervical adenopathy.  Skin:    General: Skin is warm and dry.  Neurological:     Mental Status: She is alert and oriented to person, place, and time.     Cranial Nerves: No cranial nerve deficit.  Psychiatric:        Speech: Speech normal.        Behavior: Behavior normal.        Thought Content: Thought content normal.        Judgment: Judgment normal.    Diabetic Foot Exam - Simple   Simple Foot Form Diabetic Foot exam was performed with the following findings:  Yes 07/03/2018 11:00 AM  Visual Inspection No deformities, no ulcerations, no other skin breakdown bilaterally:  Yes Sensation Testing Intact to touch and monofilament testing bilaterally:  Yes Pulse Check Posterior Tibialis and Dorsalis pulse intact bilaterally:  Yes Comments     Depression Screen PHQ 2/9 Scores 02/27/2018 01/02/2018 10/28/2016 06/24/2016  PHQ - 2 Score 0 6 6 1   PHQ- 9 Score 0 24 15 2   Exception Documentation - - - -       Assessment & Plan:     Routine Health Maintenance and Physical Exam  Exercise Activities and Dietary recommendations Goals    . Exercise 150 minutes per week (moderate activity)       Immunization History  Administered Date(s) Administered  . Influenza Split 01/17/2009, 01/22/2010, 03/31/2011, 01/14/2012  . Influenza,inj,Quad PF,6+ Mos 04/13/2013, 05/13/2015  . Tdap 12/07/2007    Health Maintenance  Topic Date Due  . PNEUMOCOCCAL POLYSACCHARIDE VACCINE AGE 54-64 HIGH RISK  08/07/1959  . FOOT EXAM  08/07/1967  . OPHTHALMOLOGY EXAM  08/07/1967  . HIV Screening  08/06/1972  . TETANUS/TDAP  12/06/2017  . HEMOGLOBIN A1C  01/01/2018  . MAMMOGRAM  07/02/2018  . INFLUENZA VACCINE  06/28/2019 (Originally 11/24/2017)  . PAP SMEAR-Modifier  07/14/2020  . COLONOSCOPY  07/20/2021  . Hepatitis C Screening  Completed     Discussed health benefits of physical activity, and encouraged her to engage in  regular exercise appropriate for her age and condition.    1. Annual physical exam Normal physical exam today. Will check labs as below and f/u pending lab results. If labs are stable and WNL she will not need to have these rechecked for one year at her next annual physical exam. She is to call the office in the meantime if she has any acute issue, questions or concerns. - CBC with Differential/Platelet  2. Hypothyroidism, unspecified type Asymptomatic. On levothyroxine 14mcg. Will check labs as below and f/u pending results. - TSH  3. Essential hypertension Stable. Continue lisinopril-hctz 20-25mg , metoprolol XR 25mg . Will check labs as below and f/u pending results. - CBC with Differential/Platelet - Comprehensive metabolic panel  4. Pure hypercholesterolemia Stable on simvastatin 10mg . Will check labs as below and f/u pending results. - Lipid Panel With LDL/HDL Ratio  5. Type 2 diabetes mellitus without complication, without long-term current use of insulin (HCC) Diet controlled. Last A1c was 6.4. Will check labs as below and f/u pending results. - Hemoglobin A1c  6. Screening for HIV (human immunodeficiency virus) Will check labs as below and f/u pending results. - HIV Antibody (routine testing w rflx)  7. Need for Td vaccine Td booster Vaccine given to patient without complications. Patient sat for 15 minutes after administration and was tolerated well without adverse effects. - Td vaccine greater than or equal to 7yo preservative free IM  8. Refused pneumococcal vaccination Patient declines pneumococcal 23.   --------------------------------------------------------------------    Mar Daring, [name]  [facility]

## 2018-07-03 NOTE — Patient Instructions (Signed)
Health Maintenance for Postmenopausal Women Menopause is a normal process in which your reproductive ability comes to an end. This process happens gradually over a span of months to years, usually between the ages of 62 and 89. Menopause is complete when you have missed 12 consecutive menstrual periods. It is important to talk with your health care provider about some of the most common conditions that affect postmenopausal women, such as heart disease, cancer, and bone loss (osteoporosis). Adopting a healthy lifestyle and getting preventive care can help to promote your health and wellness. Those actions can also lower your chances of developing some of these common conditions. What should I know about menopause? During menopause, you may experience a number of symptoms, such as:  Moderate-to-severe hot flashes.  Night sweats.  Decrease in sex drive.  Mood swings.  Headaches.  Tiredness.  Irritability.  Memory problems.  Insomnia. Choosing to treat or not to treat menopausal changes is an individual decision that you make with your health care provider. What should I know about hormone replacement therapy and supplements? Hormone therapy products are effective for treating symptoms that are associated with menopause, such as hot flashes and night sweats. Hormone replacement carries certain risks, especially as you become older. If you are thinking about using estrogen or estrogen with progestin treatments, discuss the benefits and risks with your health care provider. What should I know about heart disease and stroke? Heart disease, heart attack, and stroke become more likely as you age. This may be due, in part, to the hormonal changes that your body experiences during menopause. These can affect how your body processes dietary fats, triglycerides, and cholesterol. Heart attack and stroke are both medical emergencies. There are many things that you can do to help prevent heart disease  and stroke:  Have your blood pressure checked at least every 1-2 years. High blood pressure causes heart disease and increases the risk of stroke.  If you are 79-72 years old, ask your health care provider if you should take aspirin to prevent a heart attack or a stroke.  Do not use any tobacco products, including cigarettes, chewing tobacco, or electronic cigarettes. If you need help quitting, ask your health care provider.  It is important to eat a healthy diet and maintain a healthy weight. ? Be sure to include plenty of vegetables, fruits, low-fat dairy products, and lean protein. ? Avoid eating foods that are high in solid fats, added sugars, or salt (sodium).  Get regular exercise. This is one of the most important things that you can do for your health. ? Try to exercise for at least 150 minutes each week. The type of exercise that you do should increase your heart rate and make you sweat. This is known as moderate-intensity exercise. ? Try to do strengthening exercises at least twice each week. Do these in addition to the moderate-intensity exercise.  Know your numbers.Ask your health care provider to check your cholesterol and your blood glucose. Continue to have your blood tested as directed by your health care provider.  What should I know about cancer screening? There are several types of cancer. Take the following steps to reduce your risk and to catch any cancer development as early as possible. Breast Cancer  Practice breast self-awareness. ? This means understanding how your breasts normally appear and feel. ? It also means doing regular breast self-exams. Let your health care provider know about any changes, no matter how small.  If you are 40 or  older, have a clinician do a breast exam (clinical breast exam or CBE) every year. Depending on your age, family history, and medical history, it may be recommended that you also have a yearly breast X-ray (mammogram).  If you  have a family history of breast cancer, talk with your health care provider about genetic screening.  If you are at high risk for breast cancer, talk with your health care provider about having an MRI and a mammogram every year.  Breast cancer (BRCA) gene test is recommended for women who have family members with BRCA-related cancers. Results of the assessment will determine the need for genetic counseling and BRCA1 and for BRCA2 testing. BRCA-related cancers include these types: ? Breast. This occurs in males or females. ? Ovarian. ? Tubal. This may also be called fallopian tube cancer. ? Cancer of the abdominal or pelvic lining (peritoneal cancer). ? Prostate. ? Pancreatic. Cervical, Uterine, and Ovarian Cancer Your health care provider may recommend that you be screened regularly for cancer of the pelvic organs. These include your ovaries, uterus, and vagina. This screening involves a pelvic exam, which includes checking for microscopic changes to the surface of your cervix (Pap test).  For women ages 21-65, health care providers may recommend a pelvic exam and a Pap test every three years. For women ages 39-65, they may recommend the Pap test and pelvic exam, combined with testing for human papilloma virus (HPV), every five years. Some types of HPV increase your risk of cervical cancer. Testing for HPV may also be done on women of any age who have unclear Pap test results.  Other health care providers may not recommend any screening for nonpregnant women who are considered low risk for pelvic cancer and have no symptoms. Ask your health care provider if a screening pelvic exam is right for you.  If you have had past treatment for cervical cancer or a condition that could lead to cancer, you need Pap tests and screening for cancer for at least 20 years after your treatment. If Pap tests have been discontinued for you, your risk factors (such as having a new sexual partner) need to be reassessed  to determine if you should start having screenings again. Some women have medical problems that increase the chance of getting cervical cancer. In these cases, your health care provider may recommend that you have screening and Pap tests more often.  If you have a family history of uterine cancer or ovarian cancer, talk with your health care provider about genetic screening.  If you have vaginal bleeding after reaching menopause, tell your health care provider.  There are currently no reliable tests available to screen for ovarian cancer. Lung Cancer Lung cancer screening is recommended for adults 57-50 years old who are at high risk for lung cancer because of a history of smoking. A yearly low-dose CT scan of the lungs is recommended if you:  Currently smoke.  Have a history of at least 30 pack-years of smoking and you currently smoke or have quit within the past 15 years. A pack-year is smoking an average of one pack of cigarettes per day for one year. Yearly screening should:  Continue until it has been 15 years since you quit.  Stop if you develop a health problem that would prevent you from having lung cancer treatment. Colorectal Cancer  This type of cancer can be detected and can often be prevented.  Routine colorectal cancer screening usually begins at age 12 and continues through  age 75.  If you have risk factors for colon cancer, your health care provider may recommend that you be screened at an earlier age.  If you have a family history of colorectal cancer, talk with your health care provider about genetic screening.  Your health care provider may also recommend using home test kits to check for hidden blood in your stool.  A small camera at the end of a tube can be used to examine your colon directly (sigmoidoscopy or colonoscopy). This is done to check for the earliest forms of colorectal cancer.  Direct examination of the colon should be repeated every 5-10 years until  age 75. However, if early forms of precancerous polyps or small growths are found or if you have a family history or genetic risk for colorectal cancer, you may need to be screened more often. Skin Cancer  Check your skin from head to toe regularly.  Monitor any moles. Be sure to tell your health care provider: ? About any new moles or changes in moles, especially if there is a change in a mole's shape or color. ? If you have a mole that is larger than the size of a pencil eraser.  If any of your family members has a history of skin cancer, especially at a young age, talk with your health care provider about genetic screening.  Always use sunscreen. Apply sunscreen liberally and repeatedly throughout the day.  Whenever you are outside, protect yourself by wearing long sleeves, pants, a wide-brimmed hat, and sunglasses. What should I know about osteoporosis? Osteoporosis is a condition in which bone destruction happens more quickly than new bone creation. After menopause, you may be at an increased risk for osteoporosis. To help prevent osteoporosis or the bone fractures that can happen because of osteoporosis, the following is recommended:  If you are 19-50 years old, get at least 1,000 mg of calcium and at least 600 mg of vitamin D per day.  If you are older than age 50 but younger than age 70, get at least 1,200 mg of calcium and at least 600 mg of vitamin D per day.  If you are older than age 70, get at least 1,200 mg of calcium and at least 800 mg of vitamin D per day. Smoking and excessive alcohol intake increase the risk of osteoporosis. Eat foods that are rich in calcium and vitamin D, and do weight-bearing exercises several times each week as directed by your health care provider. What should I know about how menopause affects my mental health? Depression may occur at any age, but it is more common as you become older. Common symptoms of depression include:  Low or sad  mood.  Changes in sleep patterns.  Changes in appetite or eating patterns.  Feeling an overall lack of motivation or enjoyment of activities that you previously enjoyed.  Frequent crying spells. Talk with your health care provider if you think that you are experiencing depression. What should I know about immunizations? It is important that you get and maintain your immunizations. These include:  Tetanus, diphtheria, and pertussis (Tdap) booster vaccine.  Influenza every year before the flu season begins.  Pneumonia vaccine.  Shingles vaccine. Your health care provider may also recommend other immunizations. This information is not intended to replace advice given to you by your health care provider. Make sure you discuss any questions you have with your health care provider. Document Released: 06/04/2005 Document Revised: 10/31/2015 Document Reviewed: 01/14/2015 Elsevier Interactive Patient Education    2019 [place].

## 2018-07-04 ENCOUNTER — Encounter: Payer: Self-pay | Admitting: Obstetrics and Gynecology

## 2018-07-04 ENCOUNTER — Other Ambulatory Visit (HOSPITAL_COMMUNITY)
Admission: RE | Admit: 2018-07-04 | Discharge: 2018-07-04 | Disposition: A | Discharge status: Home / Self Care | Payer: BLUE CROSS/BLUE SHIELD | Source: Ambulatory Visit | Attending: Obstetrics and Gynecology | Admitting: Obstetrics and Gynecology

## 2018-07-04 ENCOUNTER — Ambulatory Visit (INDEPENDENT_AMBULATORY_CARE_PROVIDER_SITE_OTHER): Payer: BLUE CROSS/BLUE SHIELD | Admitting: Obstetrics and Gynecology

## 2018-07-04 VITALS — BP 129/76 | HR 73 | Ht 64.5 in | Wt 183.4 lb

## 2018-07-04 DIAGNOSIS — F3342 Major depressive disorder, recurrent, in full remission: Secondary | ICD-10-CM

## 2018-07-04 DIAGNOSIS — Z01419 Encounter for gynecological examination (general) (routine) without abnormal findings: Secondary | ICD-10-CM | POA: Diagnosis not present

## 2018-07-04 DIAGNOSIS — Z8742 Personal history of other diseases of the female genital tract: Secondary | ICD-10-CM

## 2018-07-04 DIAGNOSIS — Z79890 Hormone replacement therapy (postmenopausal): Secondary | ICD-10-CM

## 2018-07-04 DIAGNOSIS — E039 Hypothyroidism, unspecified: Secondary | ICD-10-CM

## 2018-07-04 DIAGNOSIS — Z720 Tobacco use: Secondary | ICD-10-CM

## 2018-07-04 LAB — HEMOGLOBIN A1C
Est. average glucose Bld gHb Est-mCnc: 126 mg/dL
Hgb A1c MFr Bld: 6 % — ABNORMAL HIGH (ref 4.8–5.6)

## 2018-07-04 LAB — COMPREHENSIVE METABOLIC PANEL
A/G RATIO: 1.9 (ref 1.2–2.2)
ALT: 9 IU/L (ref 0–32)
AST: 13 IU/L (ref 0–40)
Albumin: 4.2 g/dL (ref 3.8–4.9)
Alkaline Phosphatase: 54 IU/L (ref 39–117)
BUN / CREAT RATIO: 13 (ref 12–28)
BUN: 11 mg/dL (ref 8–27)
Bilirubin Total: 0.7 mg/dL (ref 0.0–1.2)
CO2: 23 mmol/L (ref 20–29)
Calcium: 9.5 mg/dL (ref 8.7–10.3)
Chloride: 101 mmol/L (ref 96–106)
Creatinine, Ser: 0.86 mg/dL (ref 0.57–1.00)
GFR calc Af Amer: 85 mL/min/{1.73_m2} (ref >59)
GFR calc non Af Amer: 74 mL/min/{1.73_m2} (ref >59)
Globulin, Total: 2.2 g/dL (ref 1.5–4.5)
Glucose: 124 mg/dL — ABNORMAL HIGH (ref 65–99)
Potassium: 4.2 mmol/L (ref 3.5–5.2)
Sodium: 140 mmol/L (ref 134–144)
Total Protein: 6.4 g/dL (ref 6.0–8.5)

## 2018-07-04 LAB — CBC WITH DIFFERENTIAL/PLATELET
BASOS: 1 %
Basophils Absolute: 0.1 10*3/uL (ref 0.0–0.2)
EOS (ABSOLUTE): 0.1 10*3/uL (ref 0.0–0.4)
Eos: 1 %
HEMOGLOBIN: 14.7 g/dL (ref 11.1–15.9)
Hematocrit: 43.7 % (ref 34.0–46.6)
Immature Grans (Abs): 0 10*3/uL (ref 0.0–0.1)
Immature Granulocytes: 0 %
Lymphocytes Absolute: 1.9 10*3/uL (ref 0.7–3.1)
Lymphs: 23 %
MCH: 30.6 pg (ref 26.6–33.0)
MCHC: 33.6 g/dL (ref 31.5–35.7)
MCV: 91 fL (ref 79–97)
Monocytes Absolute: 0.4 10*3/uL (ref 0.1–0.9)
Monocytes: 5 %
Neutrophils Absolute: 5.8 10*3/uL (ref 1.4–7.0)
Neutrophils: 70 %
Platelets: 146 10*3/uL — ABNORMAL LOW (ref 150–450)
RBC: 4.8 x10E6/uL (ref 3.77–5.28)
RDW: 12.4 % (ref 11.7–15.4)
WBC: 8.2 10*3/uL (ref 3.4–10.8)

## 2018-07-04 LAB — LIPID PANEL WITH LDL/HDL RATIO
Cholesterol, Total: 150 mg/dL (ref 100–199)
HDL: 36 mg/dL — ABNORMAL LOW (ref >39)
LDL Calculated: 76 mg/dL (ref 0–99)
LDl/HDL Ratio: 2.1 ratio (ref 0.0–3.2)
Triglycerides: 190 mg/dL — ABNORMAL HIGH (ref 0–149)
VLDL Cholesterol Cal: 38 mg/dL (ref 5–40)

## 2018-07-04 LAB — TSH: TSH: 1.4 u[IU]/mL (ref 0.450–4.500)

## 2018-07-04 LAB — HIV ANTIBODY (ROUTINE TESTING W REFLEX): HIV SCREEN 4TH GENERATION: NONREACTIVE

## 2018-07-04 NOTE — Progress Notes (Signed)
Pt is present today for a follow up for hormone issues, depression and medication check and pap smear. Pt stated that she is doing a little better, but still do not feel like her normal self.

## 2018-07-04 NOTE — Patient Instructions (Signed)
Health Maintenance for Postmenopausal Women Menopause is a normal process in which your reproductive ability comes to an end. This process happens gradually over a span of months to years, usually between the ages of 62 and 89. Menopause is complete when you have missed 12 consecutive menstrual periods. It is important to talk with your health care provider about some of the most common conditions that affect postmenopausal women, such as heart disease, cancer, and bone loss (osteoporosis). Adopting a healthy lifestyle and getting preventive care can help to promote your health and wellness. Those actions can also lower your chances of developing some of these common conditions. What should I know about menopause? During menopause, you may experience a number of symptoms, such as:  Moderate-to-severe hot flashes.  Night sweats.  Decrease in sex drive.  Mood swings.  Headaches.  Tiredness.  Irritability.  Memory problems.  Insomnia. Choosing to treat or not to treat menopausal changes is an individual decision that you make with your health care provider. What should I know about hormone replacement therapy and supplements? Hormone therapy products are effective for treating symptoms that are associated with menopause, such as hot flashes and night sweats. Hormone replacement carries certain risks, especially as you become older. If you are thinking about using estrogen or estrogen with progestin treatments, discuss the benefits and risks with your health care provider. What should I know about heart disease and stroke? Heart disease, heart attack, and stroke become more likely as you age. This may be due, in part, to the hormonal changes that your body experiences during menopause. These can affect how your body processes dietary fats, triglycerides, and cholesterol. Heart attack and stroke are both medical emergencies. There are many things that you can do to help prevent heart disease  and stroke:  Have your blood pressure checked at least every 1-2 years. High blood pressure causes heart disease and increases the risk of stroke.  If you are 79-72 years old, ask your health care provider if you should take aspirin to prevent a heart attack or a stroke.  Do not use any tobacco products, including cigarettes, chewing tobacco, or electronic cigarettes. If you need help quitting, ask your health care provider.  It is important to eat a healthy diet and maintain a healthy weight. ? Be sure to include plenty of vegetables, fruits, low-fat dairy products, and lean protein. ? Avoid eating foods that are high in solid fats, added sugars, or salt (sodium).  Get regular exercise. This is one of the most important things that you can do for your health. ? Try to exercise for at least 150 minutes each week. The type of exercise that you do should increase your heart rate and make you sweat. This is known as moderate-intensity exercise. ? Try to do strengthening exercises at least twice each week. Do these in addition to the moderate-intensity exercise.  Know your numbers.Ask your health care provider to check your cholesterol and your blood glucose. Continue to have your blood tested as directed by your health care provider.  What should I know about cancer screening? There are several types of cancer. Take the following steps to reduce your risk and to catch any cancer development as early as possible. Breast Cancer  Practice breast self-awareness. ? This means understanding how your breasts normally appear and feel. ? It also means doing regular breast self-exams. Let your health care provider know about any changes, no matter how small.  If you are 40 or  older, have a clinician do a breast exam (clinical breast exam or CBE) every year. Depending on your age, family history, and medical history, it may be recommended that you also have a yearly breast X-ray (mammogram).  If you  have a family history of breast cancer, talk with your health care provider about genetic screening.  If you are at high risk for breast cancer, talk with your health care provider about having an MRI and a mammogram every year.  Breast cancer (BRCA) gene test is recommended for women who have family members with BRCA-related cancers. Results of the assessment will determine the need for genetic counseling and BRCA1 and for BRCA2 testing. BRCA-related cancers include these types: ? Breast. This occurs in males or females. ? Ovarian. ? Tubal. This may also be called fallopian tube cancer. ? Cancer of the abdominal or pelvic lining (peritoneal cancer). ? Prostate. ? Pancreatic. Cervical, Uterine, and Ovarian Cancer Your health care provider may recommend that you be screened regularly for cancer of the pelvic organs. These include your ovaries, uterus, and vagina. This screening involves a pelvic exam, which includes checking for microscopic changes to the surface of your cervix (Pap test).  For women ages 21-65, health care providers may recommend a pelvic exam and a Pap test every three years. For women ages 39-65, they may recommend the Pap test and pelvic exam, combined with testing for human papilloma virus (HPV), every five years. Some types of HPV increase your risk of cervical cancer. Testing for HPV may also be done on women of any age who have unclear Pap test results.  Other health care providers may not recommend any screening for nonpregnant women who are considered low risk for pelvic cancer and have no symptoms. Ask your health care provider if a screening pelvic exam is right for you.  If you have had past treatment for cervical cancer or a condition that could lead to cancer, you need Pap tests and screening for cancer for at least 20 years after your treatment. If Pap tests have been discontinued for you, your risk factors (such as having a new sexual partner) need to be reassessed  to determine if you should start having screenings again. Some women have medical problems that increase the chance of getting cervical cancer. In these cases, your health care provider may recommend that you have screening and Pap tests more often.  If you have a family history of uterine cancer or ovarian cancer, talk with your health care provider about genetic screening.  If you have vaginal bleeding after reaching menopause, tell your health care provider.  There are currently no reliable tests available to screen for ovarian cancer. Lung Cancer Lung cancer screening is recommended for adults 57-50 years old who are at high risk for lung cancer because of a history of smoking. A yearly low-dose CT scan of the lungs is recommended if you:  Currently smoke.  Have a history of at least 30 pack-years of smoking and you currently smoke or have quit within the past 15 years. A pack-year is smoking an average of one pack of cigarettes per day for one year. Yearly screening should:  Continue until it has been 15 years since you quit.  Stop if you develop a health problem that would prevent you from having lung cancer treatment. Colorectal Cancer  This type of cancer can be detected and can often be prevented.  Routine colorectal cancer screening usually begins at age 12 and continues through  age 75.  If you have risk factors for colon cancer, your health care provider may recommend that you be screened at an earlier age.  If you have a family history of colorectal cancer, talk with your health care provider about genetic screening.  Your health care provider may also recommend using home test kits to check for hidden blood in your stool.  A small camera at the end of a tube can be used to examine your colon directly (sigmoidoscopy or colonoscopy). This is done to check for the earliest forms of colorectal cancer.  Direct examination of the colon should be repeated every 5-10 years until  age 75. However, if early forms of precancerous polyps or small growths are found or if you have a family history or genetic risk for colorectal cancer, you may need to be screened more often. Skin Cancer  Check your skin from head to toe regularly.  Monitor any moles. Be sure to tell your health care provider: ? About any new moles or changes in moles, especially if there is a change in a mole's shape or color. ? If you have a mole that is larger than the size of a pencil eraser.  If any of your family members has a history of skin cancer, especially at a young age, talk with your health care provider about genetic screening.  Always use sunscreen. Apply sunscreen liberally and repeatedly throughout the day.  Whenever you are outside, protect yourself by wearing long sleeves, pants, a wide-brimmed hat, and sunglasses. What should I know about osteoporosis? Osteoporosis is a condition in which bone destruction happens more quickly than new bone creation. After menopause, you may be at an increased risk for osteoporosis. To help prevent osteoporosis or the bone fractures that can happen because of osteoporosis, the following is recommended:  If you are 19-50 years old, get at least 1,000 mg of calcium and at least 600 mg of vitamin D per day.  If you are older than age 50 but younger than age 70, get at least 1,200 mg of calcium and at least 600 mg of vitamin D per day.  If you are older than age 70, get at least 1,200 mg of calcium and at least 800 mg of vitamin D per day. Smoking and excessive alcohol intake increase the risk of osteoporosis. Eat foods that are rich in calcium and vitamin D, and do weight-bearing exercises several times each week as directed by your health care provider. What should I know about how menopause affects my mental health? Depression may occur at any age, but it is more common as you become older. Common symptoms of depression include:  Low or sad  mood.  Changes in sleep patterns.  Changes in appetite or eating patterns.  Feeling an overall lack of motivation or enjoyment of activities that you previously enjoyed.  Frequent crying spells. Talk with your health care provider if you think that you are experiencing depression. What should I know about immunizations? It is important that you get and maintain your immunizations. These include:  Tetanus, diphtheria, and pertussis (Tdap) booster vaccine.  Influenza every year before the flu season begins.  Pneumonia vaccine.  Shingles vaccine. Your health care provider may also recommend other immunizations. This information is not intended to replace advice given to you by your health care provider. Make sure you discuss any questions you have with your health care provider. Document Released: 06/04/2005 Document Revised: 10/31/2015 Document Reviewed: 01/14/2015 Elsevier Interactive Patient Education    2019 [place].

## 2018-07-04 NOTE — Progress Notes (Signed)
ANNUAL PREVENTATIVE CARE GYNECOLOGY  ENCOUNTER NOTE  Subjective:       Regina Pierce is a 63 y.o. G26P2002 female here for a routine annual gynecologic exam. The patient is not sexually active. The patient is taking hormone replacement therapy. Patient denies post-menopausal vaginal bleeding. The patient wears seatbelts: yes. The patient participates in regular exercise: yes (walking 3-5 days per week). Has the patient ever been transfused or tattooed?: no. The patient reports that there is not domestic violence in her life.  Current complaints: 1.  Denies.  Notes she is doing much better on the Seroquel regarding her mood and sleep issues.     Gynecologic History Patient's last menstrual period was 04/27/2007. Contraception: post menopausal status Last Pap: 07/14/2017. Results were: abnormal (ASCUS, HR HPV neg). Previous h/o abnormal pap smear in 2018 with LGSIL pap smear, followed by colposcopy.  Last mammogram: 07/03/2018. Results were: pending. Last Colonoscopy: 2018. Results were: normal (except diverticulosis noted) Last Dexa Scan: patient has never had one.    Obstetric History OB History  Gravida Para Term Preterm AB Living  2 2 2     2   SAB TAB Ectopic Multiple Live Births          2    # Outcome Date GA Lbr Len/2nd Weight Sex Delivery Anes PTL Lv  2 Term      Vag-Spont   LIV  1 Term      Vag-Spont   LIV    Past Medical History:  Diagnosis Date  . Anxiety   . Depression   . Diabetes mellitus without complication (HCC)    no meds  . Hyperlipidemia   . Hypertension   . Thyroid disease     Family History  Problem Relation Age of Onset  . Hypertension Sister   . Breast cancer Neg Hx     Past Surgical History:  Procedure Laterality Date  . ACHILLES TENDON SURGERY Left 05/12/2016   Procedure: ACHILLES TENDON REPAIR;  Surgeon: Samara Deist, DPM;  Location: [facility] ORS;  Service: Podiatry;  Laterality: Left;  . BONE EXOSTOSIS EXCISION Left 05/12/2016   Procedure:  CALCANEAL EXOSTECTOMY AND DORSAL EXOSTECTOMY;  Surgeon: Samara Deist, DPM;  Location: [facility] OR[facility];  Service: Podiatry;  Laterality: Left;  . COLONOSCOPY WITH PROPOFOL N/A 07/20/2016   Procedure: COLONOSCOPY WITH PROPOFOL;  Surgeon: Jonathon Bellows, MD;  Location: [facility] END[facility];  Service: Endoscopy;  Laterality: N/A;  . HAND SURGERY Bilateral 1999   carpal tunnel   . KNEE SURGERY  2010  . TUBAL LIGATION  1987    Social History   Socioeconomic History  . Marital status: Married    Spouse name: Not on file  . Number of children: Not on file  . Years of education: Not on file  . Highest education level: Not on file  Occupational History  . Not on file  Social Needs  . Financial resource strain: Not on file  . Food insecurity:    Worry: Not on file    Inability: Not on file  . Transportation needs:    Medical: Not on file    Non-medical: Not on file  Tobacco Use  . Smoking status: Current Every Day Smoker    Packs/day: 1.00    Years: 30.00    Pack years: 30.00    Types: Cigarettes    Last attempt to quit: 01/24/2017    Years since quitting: 1.4  . Smokeless tobacco: Never Used  Substance and Sexual Activity  . Alcohol  use: No    Alcohol/week: 0.0 standard drinks  . Drug use: No  . Sexual activity: Not Currently    Birth control/protection: Post-menopausal  Lifestyle  . Physical activity:    Days per week: Not on file    Minutes per session: Not on file  . Stress: Not on file  Relationships  . Social connections:    Talks on phone: Not on file    Gets together: Not on file    Attends religious service: Not on file    Active member of club or organization: Not on file    Attends meetings of clubs or organizations: Not on file    Relationship status: Not on file  . Intimate partner violence:    Fear of current or ex partner: Not on file    Emotionally abused: Not on file    Physically abused: Not on file    Forced sexual activity: Not on file  Other Topics Concern  . Not  on file  Social History Narrative  . Not on file    Current Outpatient Medications on File Prior to Visit  Medication Sig Dispense Refill  . acetaminophen (TYLENOL) 500 MG tablet Take 1,000 mg by mouth every 6 (six) hours as needed for mild pain.    Marland Kitchen aspirin 81 MG tablet Take 81 mg by mouth every evening.     Marland Kitchen Dextromethorphan-guaiFENesin (CORICIDIN HBP CONGESTION/COUGH PO) Take by mouth.    . levothyroxine (SYNTHROID, LEVOTHROID) 150 MCG tablet Take 1 tablet (150 mcg total) by mouth daily. 90 tablet 1  . lisinopril-hydrochlorothiazide (PRINZIDE,ZESTORETIC) 20-25 MG tablet Take 1 tablet by mouth daily. 90 tablet 1  . LORazepam (ATIVAN) 1 MG tablet TAKE 1 TABLET BY MOUTH 3 TIMES A DAY AS NEEDED FOR ANXIETY 90 tablet 5  . metoprolol succinate (TOPROL-XL) 25 MG 24 hr tablet Take 1 tablet (25 mg total) by mouth daily. 90 tablet 1  . naproxen sodium (ANAPROX) 220 MG tablet Take 440 mg by mouth daily as needed (pain).    . norethindrone-ethinyl estradiol (FEMHRT LOW DOSE) 0.5-2.5 MG-MCG tablet Take 1 tablet by mouth daily. 30 tablet 1  . QUEtiapine (SEROQUEL) 50 MG tablet TAKE 1 TABLET BY MOUTH EVERY NIGHT AT BEDTIME 30 tablet 6  . simvastatin (ZOCOR) 10 MG tablet Take 1 tablet (10 mg total) by mouth at bedtime. 90 tablet 1  . nicotine (NICODERM CQ - DOSED IN MG/24 HOURS) 21 mg/24hr patch PLACE ONE PATCH (21 MG TOTAL) ONTO THE SKIN DAILY. (Patient not taking: Reported on 07/03/2018) 28 patch 1   No current facility-administered medications on file prior to visit.     No Known Allergies   Review of Systems ROS Review of Systems - General ROS: negative for - chills, fatigue, fever, hot flashes, night sweats, weight gain or weight loss Psychological ROS: negative for - anxiety, decreased libido, depression, mood swings, physical abuse or sexual abuse Ophthalmic ROS: negative for - blurry vision, eye pain or loss of vision ENT ROS: negative for - headaches, hearing change, visual changes or  vocal changes Allergy and Immunology ROS: negative for - hives, itchy/watery eyes or seasonal allergies Hematological and Lymphatic ROS: negative for - bleeding problems, bruising, swollen lymph nodes or weight loss Endocrine ROS: negative for - galactorrhea, hair pattern changes, hot flashes, malaise/lethargy, mood swings, palpitations, polydipsia/polyuria, skin changes, temperature intolerance or unexpected weight changes Breast ROS: negative for - new or changing breast lumps or nipple discharge Respiratory ROS: negative for - cough or shortness  of breath Cardiovascular ROS: negative for - chest pain, irregular heartbeat, palpitations or shortness of breath Gastrointestinal ROS: no abdominal pain, change in bowel habits, or black or bloody stools Genito-Urinary ROS: no dysuria, trouble voiding, or hematuria Musculoskeletal ROS: negative for - joint pain or joint stiffness Neurological ROS: negative for - bowel and bladder control changes Dermatological ROS: negative for rash and skin lesion changes   Objective:   BP 129/76   Pulse 73   Ht 5' 4.5" (1.638 m)   Wt 183 lb 6.4 oz (83.2 kg)   LMP 04/27/2007   BMI 30.99 kg/m  CONSTITUTIONAL: Well-developed, well-nourished female in no acute distress. Mild obesity PSYCHIATRIC: Normal mood and affect. Normal behavior. Normal judgment and thought content. [place]: Alert and oriented to person, place, and time. Normal muscle tone coordination. No cranial nerve deficit noted. HENT:  Normocephalic, atraumatic, External right and left ear normal. Oropharynx is clear and moist EYES: Conjunctivae and EOM are normal. Pupils are equal, round, and reactive to light. No scleral icterus.  NECK: Normal range of motion, supple, no masses.  Normal thyroid.  SKIN: Skin is warm and dry. No rash noted. Not diaphoretic. No erythema. No pallor. CARDIOVASCULAR: Normal heart rate noted, regular rhythm, no murmur. RESPIRATORY: Clear to auscultation bilaterally.  Effort and breath sounds normal, no problems with respiration noted. BREASTS: Symmetric in size. No masses, skin changes, nipple drainage, or lymphadenopathy. ABDOMEN: Soft, normal bowel sounds, no distention noted.  No tenderness, rebound or guarding.  BLADDER: Normal PELVIC:  Bladder no bladder distension noted  Urethra: normal appearing urethra with no masses, tenderness or lesions  Vulva: normal appearing vulva with no masses, tenderness or lesions  Vagina: normal appearing vagina with scant thin discharge, no lesions. Atrophic  Cervix: normal appearing cervix without discharge or lesions  Uterus: uterus is normal size, shape, consistency and nontender  Adnexa: normal adnexa in size, nontender and no masses  RV: External Exam NormaI, No Rectal Masses and Normal Sphincter tone  MUSCULOSKELETAL: Normal range of motion. No tenderness.  No cyanosis, clubbing, or edema.  2+ distal pulses. LYMPHATIC: No Axillary, Supraclavicular, or Inguinal Adenopathy.   Labs: Lab Results  Component Value Date   WBC 8.2 07/03/2018   HGB 14.7 07/03/2018   HCT 43.7 07/03/2018   MCV 91 07/03/2018   PLT 146 (L) 07/03/2018    Lab Results  Component Value Date   CREATININE 0.86 07/03/2018   BUN 11 07/03/2018   NA 140 07/03/2018   K 4.2 07/03/2018   CL 101 07/03/2018   CO2 23 07/03/2018    Lab Results  Component Value Date   ALT 9 07/03/2018   AST 13 07/03/2018   ALKPHOS 54 07/03/2018   BILITOT 0.7 07/03/2018    Lab Results  Component Value Date   CHOL 150 07/03/2018   HDL 36 (L) 07/03/2018   LDLCALC 76 07/03/2018   TRIG 190 (H) 07/03/2018   CHOLHDL 4.7 (H) 06/30/2016    Lab Results  Component Value Date   TSH 1.400 07/03/2018    Lab Results  Component Value Date   HGBA1C 6.0 (H) 07/03/2018     Assessment:   Well woman exam with routine gynecological exam  History of abnormal cervical Pap smear Menopause on HRT Recurrent major depressive disorder, in full remission  ([place]) Tobacco abuse Hypothyroidism  Plan:  Pap: Pap Co Test for h/o abnormal pap smears Mammogram: performed yesterday, pending results.  Stool Guaiac Testing:  Not ordered. Up to date on colonoscopy.  Labs: None ordered. Had labs performed with PCP yesterday.  Routine preventative health maintenance measures emphasized: Exercise/Diet/Weight control, Tobacco Warnings, Alcohol/Substance use risks and Stress Management Declines flu vaccine.  Tobacco abuse - supposed to begin taking nicotine patches this week. Has not yet set quit date.  Hypothyroidism managed by PCP Return to [place], MD [facility]

## 2018-07-05 ENCOUNTER — Encounter: Payer: Self-pay | Admitting: Obstetrics and Gynecology

## 2018-07-05 ENCOUNTER — Telehealth: Payer: Self-pay | Admitting: Physician Assistant

## 2018-07-05 NOTE — Telephone Encounter (Signed)
Patient advised as below.  

## 2018-07-05 NOTE — Telephone Encounter (Signed)
Pt called saying she is returning a call   Thanks teri

## 2018-07-05 NOTE — Telephone Encounter (Signed)
-----   Message from Mar Daring, [name] sent at 07/05/2018  1:49 PM EDT ----- Blood count is normal. Kidney and liver function normal. A1c down to 6.0 from 6.4. Cholesterol stable and normal. Thyroid normal. Once in lifetime HIV screen negative.

## 2018-07-10 LAB — CYTOLOGY - PAP
DIAGNOSIS: NEGATIVE
HPV (WINDOPATH): NOT DETECTED

## 2018-07-12 ENCOUNTER — Telehealth: Payer: Self-pay

## 2018-07-12 ENCOUNTER — Telehealth: Payer: Self-pay | Admitting: Physician Assistant

## 2018-07-12 ENCOUNTER — Telehealth: Payer: Self-pay | Admitting: Obstetrics and Gynecology

## 2018-07-12 NOTE — Telephone Encounter (Signed)
-----   Message from Mar Daring, [name] sent at 07/12/2018  7:51 AM EDT ----- Normal mammogram. Repeat screening in one year.

## 2018-07-12 NOTE — Telephone Encounter (Signed)
Pt had a mammogram last Monday and has not heard anything back  Do we have the results  CB# [phone] leave a message if no one answers  [place]

## 2018-07-12 NOTE — Telephone Encounter (Signed)
Patient advised.KW 

## 2018-07-12 NOTE — Telephone Encounter (Signed)
Pt called and went over test results. Pt was thinking that she would get her test results in the mail. Pt was asked if she wanted a copy of her results and she stated that she did not.

## 2018-07-12 NOTE — Telephone Encounter (Signed)
The patient is asking for her pap results.  She states she has not gotten anything in the mail, but I informed her the results just came back 2 days ago and Dr. Marcelline Mates had not reviewed/released the results yet.  She is asking for a call when the results are available, please advise, thanks.

## 2018-09-04 ENCOUNTER — Telehealth: Payer: Self-pay

## 2018-09-04 DIAGNOSIS — F339 Major depressive disorder, recurrent, unspecified: Secondary | ICD-10-CM

## 2018-09-04 MED ORDER — DESVENLAFAXINE SUCCINATE ER 100 MG PO TB24: 100.0000 mg | ORAL_TABLET | Freq: Every day | ORAL | 3 refills | Status: DC

## 2018-09-04 NOTE — Telephone Encounter (Signed)
Noted will add back to med list.

## 2018-09-04 NOTE — Telephone Encounter (Signed)
Patient called to let Tawanna Sat know that she is going to start back on her Depression meds (Pristiq 100mg  daily). She has one refill left on it.

## 2018-09-04 NOTE — Telephone Encounter (Signed)
Please advise 

## 2018-10-09 ENCOUNTER — Other Ambulatory Visit: Payer: Self-pay | Admitting: Physician Assistant

## 2018-10-09 DIAGNOSIS — E039 Hypothyroidism, unspecified: Secondary | ICD-10-CM

## 2018-10-09 DIAGNOSIS — I10 Essential (primary) hypertension: Secondary | ICD-10-CM

## 2019-01-03 ENCOUNTER — Telehealth: Payer: Self-pay | Admitting: Obstetrics and Gynecology

## 2019-01-03 NOTE — Telephone Encounter (Signed)
The patient called and stated that she needs to up the mg on her prescription QUEtiapine (SEROQUEL) 50 MG tablet [[id number]]. The patient is requesting a call back if possible. Please advise.

## 2019-01-03 NOTE — Telephone Encounter (Signed)
Please find out what kind of symptoms she is having.   Dr. Marcelline Mates

## 2019-01-03 NOTE — Telephone Encounter (Signed)
Please advise. Thanks Lorry Furber 

## 2019-01-05 ENCOUNTER — Telehealth: Payer: Self-pay | Admitting: Obstetrics and Gynecology

## 2019-01-05 ENCOUNTER — Other Ambulatory Visit: Payer: Self-pay | Admitting: Physician Assistant

## 2019-01-05 DIAGNOSIS — F419 Anxiety disorder, unspecified: Secondary | ICD-10-CM

## 2019-01-05 NOTE — Telephone Encounter (Signed)
Please see another phone encounter.  

## 2019-01-05 NOTE — Telephone Encounter (Signed)
The patient called and stated that she sent a message back on Wednesday about upping the mg amount of her medication and she did not hear anything back. The patient is requesting a call back before the weekend is over. Please advise.

## 2019-01-05 NOTE — Telephone Encounter (Signed)
Pt stated that she do not any energy to do anything and that she do not enjoy to do things she like any more she just wants to feel better and was wondering if she could get an increase in her Seroquel from 50 mg to 100 mg pt stated that she started back on her depression medication Pristiq.

## 2019-01-05 NOTE — Telephone Encounter (Signed)
Please let patient know that by restarting her depression medication she will likely notice an improvement in her symptoms over the next several weeks without necessarily needing to increase her Seroquel. We can increase her Seroquel to help if her symptoms still do not improve though in a few weeks. You can send in a new prescription for the increased dose.   Dr. Marcelline Mates

## 2019-01-09 ENCOUNTER — Telehealth: Payer: Self-pay | Admitting: Obstetrics and Gynecology

## 2019-01-09 MED ORDER — QUETIAPINE FUMARATE 100 MG PO TABS: 100.0000 mg | ORAL_TABLET | Freq: Every day | ORAL | 3 refills | Status: DC

## 2019-01-09 NOTE — Telephone Encounter (Signed)
Please inform patient that I will change her prescription to the 100 mg dosing. Please let us know if she has any issues.   Dr. Marcelline Mates

## 2019-01-09 NOTE — Telephone Encounter (Signed)
Pt is aware of the information given by [facility]. Pt stated that she has been taking Pristiq for 3 months now and has not noticed any improvement. Pt would like to increase her Seroquel to 100 mg and have a prescription sent to [facility].

## 2019-01-09 NOTE — Telephone Encounter (Signed)
Patient called stating she needs to increase the dosage of seroquel. She states she has requested this several times now and that the nurse may be confused as which medication she is talking about. She would like a call back.Thanks.

## 2019-01-09 NOTE — Addendum Note (Signed)
Addended by: Augusto Gamble on: 01/09/2019 09:02 PM   Modules accepted: Orders

## 2019-01-10 NOTE — Telephone Encounter (Signed)
Pt called and informed her that [facility] had sent in a prescription for seroquel 100 mg. Pt was advised that if she did not notice any change in 2-3 weeks to please contact the office. Pt stated that she understood.

## 2019-01-10 NOTE — Telephone Encounter (Signed)
Please see another phone encounter.  

## 2019-02-02 ENCOUNTER — Other Ambulatory Visit: Payer: Self-pay | Admitting: Obstetrics and Gynecology

## 2019-02-02 ENCOUNTER — Other Ambulatory Visit: Payer: Self-pay | Admitting: Physician Assistant

## 2019-02-02 DIAGNOSIS — E7800 Pure hypercholesterolemia, unspecified: Secondary | ICD-10-CM

## 2019-02-02 DIAGNOSIS — E039 Hypothyroidism, unspecified: Secondary | ICD-10-CM

## 2019-05-01 ENCOUNTER — Telehealth: Payer: Self-pay

## 2019-05-01 DIAGNOSIS — Z1239 Encounter for other screening for malignant neoplasm of breast: Secondary | ICD-10-CM

## 2019-05-01 NOTE — Telephone Encounter (Signed)
Mammogram ordered

## 2019-05-01 NOTE — Telephone Encounter (Signed)
Is it okay to order?  Thanks,   -Laura  

## 2019-05-01 NOTE — Telephone Encounter (Signed)
Copied from [place] [phone]. Topic: Referral - Request for Referral >> May 01, 2019 10:53 AM Scherrie Gerlach wrote: Pt needs order for a mammogram at [facility] for a 3 D breast exam. Can you put the order in?

## 2019-05-30 ENCOUNTER — Other Ambulatory Visit: Payer: Self-pay | Admitting: Physician Assistant

## 2019-05-30 DIAGNOSIS — I10 Essential (primary) hypertension: Secondary | ICD-10-CM

## 2019-06-28 ENCOUNTER — Other Ambulatory Visit: Payer: Self-pay | Admitting: Physician Assistant

## 2019-06-28 DIAGNOSIS — I10 Essential (primary) hypertension: Secondary | ICD-10-CM

## 2019-06-28 NOTE — Telephone Encounter (Signed)
Courtesy refill given until appt on 07/05/19.

## 2019-07-03 NOTE — Progress Notes (Signed)
Patient: Regina Pierce, Female    DOB: May 03, 1955, 64 y.o.   MRN: [id number] Visit Date: 07/05/2019  Today's Provider: Mar Daring, [name]   Chief Complaint  Patient presents with   Annual Exam   Subjective:     Annual physical exam Regina Pierce is a 63 y.o. female who presents today for health maintenance and complete physical. She feels fairly well. She reports exercising walks daily. She reports she is sleeping well. ----------------------------------------------------------------- 07/10/18-normal pap smear. To continue routine screening. Followed by GYN 07/05/2019-Mammogram scheduled 07/17/16-Repeat colonoscopy in 3 - 5 years for surveillance based on pathology results  Review of Systems  Constitutional: Positive for fatigue ("feels very fatigue").  HENT: Negative.   Eyes: Negative.   Respiratory: Negative.   Cardiovascular: Negative.   Gastrointestinal: Negative.   Endocrine: Negative.   Genitourinary: Negative.   Musculoskeletal: Negative.   Skin: Negative.   Allergic/Immunologic: Negative.   Neurological: Negative.   Hematological: Negative.   Psychiatric/Behavioral: Positive for agitation, decreased concentration, dysphoric mood and sleep disturbance. The patient is nervous/anxious.     Social History      She  reports that she has been smoking cigarettes. She has a 30.00 pack-year smoking history. She has never used smokeless tobacco. She reports that she does not drink alcohol or use drugs.       Social History   Socioeconomic History   Marital status: Married    Spouse name: Not on file   Number of children: Not on file   Years of education: Not on file   Highest education level: Not on file  Occupational History   Not on file  Tobacco Use   Smoking status: Current Every Day Smoker    Packs/day: 1.00    Years: 30.00    Pack years: 30.00    Types: Cigarettes    Last attempt to quit: 01/24/2017    Years since quitting: 2.4    Smokeless tobacco: Never Used  Substance and Sexual Activity   Alcohol use: No    Alcohol/week: 0.0 standard drinks   Drug use: No   Sexual activity: Not Currently    Birth control/protection: Post-menopausal  Other Topics Concern   Not on file  Social History Narrative   Not on file   Social Determinants of Health   Financial Resource Strain:    Difficulty of Paying Living Expenses:   Food Insecurity:    Worried About Charity fundraiser in the Last Year:    Arboriculturist in the Last Year:   Transportation Needs:    Film/video editor (Medical):    Lack of Transportation (Non-Medical):   Physical Activity:    Days of Exercise per Week:    Minutes of Exercise per Session:   Stress:    Feeling of Stress :   Social Connections:    Frequency of Communication with Friends and Family:    Frequency of Social Gatherings with Friends and Family:    Attends Religious Services:    Active Member of Clubs or Organizations:    Attends Music therapist:    Marital Status:     Past Medical History:  Diagnosis Date   Anxiety    Depression    Diabetes mellitus without complication ([place])    no meds   Hyperlipidemia    Hypertension    Thyroid disease      Patient Active Problem List   Diagnosis Date Noted  Low grade squamous intraepithelial lesion (LGSIL) on cervical Pap smear 06/29/2016   Pap smear abnormality of cervix/human papillomavirus (HPV) positive 06/29/2016   Type 2 diabetes mellitus without complications ([place]) [id number]   Tobacco abuse 05/13/2015   Polyp of colon 01/14/2015   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   Hyperlipidemia, unspecified 01/14/2015   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   Cyst of ovary 01/14/2015   Diaphragmatic hernia without obstruction or gangrene 01/14/2015   Major depressive disorder, single episode 01/14/2015   Anxiety 01/13/2015   BP (high blood pressure)  01/13/2015   Hypothyroidism 01/13/2015   Depression 11/05/2014    Past Surgical History:  Procedure Laterality Date   ACHILLES TENDON SURGERY Left 05/12/2016   Procedure: ACHILLES TENDON REPAIR;  Surgeon: Samara Deist, DPM;  Location: [facility];  Service: Podiatry;  Laterality: Left;   BONE EXOSTOSIS EXCISION Left 05/12/2016   Procedure: CALCANEAL EXOSTECTOMY AND DORSAL EXOSTECTOMY;  Surgeon: Samara Deist, DPM;  Location: [facility];  Service: Podiatry;  Laterality: Left;   COLONOSCOPY WITH PROPOFOL N/A 07/20/2016   Procedure: COLONOSCOPY WITH PROPOFOL;  Surgeon: Jonathon Bellows, MD;  Location: [facility];  Service: Endoscopy;  Laterality: N/A;   HAND SURGERY Bilateral 1999   carpal tunnel    KNEE SURGERY  2010   TUBAL LIGATION  1987    Family History        Family Status  Relation Name Status   Mother  Deceased   Father  Deceased   Sister  Deceased at age 42       heart defect   Brother  Alive   Sister  Deceased       [employer] accident   Sister  Alive   Neg Hx  (Not Specified)        Her family history includes Hypertension in her sister. There is no history of Breast cancer.      No Known Allergies   Current Outpatient Medications:    aspirin 81 MG tablet, Take 81 mg by mouth every evening. , Disp: , Rfl:    desvenlafaxine (PRISTIQ) 100 MG 24 hr tablet, Take 1 tablet (100 mg total) by mouth daily., Disp: 90 tablet, Rfl: 3   levothyroxine (SYNTHROID) 150 MCG tablet, TAKE 1 TAB BY MOUTH ONCE DAILY. TAKE ON AN EMPTY STOMACH WITH A GLASSOF WATER ATLEAST 30-60 MINUTES BEFORE BREAKFAST, Disp: 90 tablet, Rfl: 1   lisinopril-hydrochlorothiazide (ZESTORETIC) 20-25 MG tablet, TAKE 1 TABLET BY MOUTH ONCE DAILY, Disp: 7 tablet, Rfl: 0   LORazepam (ATIVAN) 1 MG tablet, TAKE 1 TABLET BY MOUTH 3 TIMES A DAY AS NEEDED FOR ANXIETY, Disp: 90 tablet, Rfl: 5   metoprolol succinate (TOPROL-XL) 25 MG 24 hr tablet, TAKE 1 TABLET BY MOUTH ONCE DAILY, Disp: 7 tablet, Rfl: 0    norethindrone-ethinyl estradiol (FEMHRT LOW DOSE) 0.5-2.5 MG-MCG tablet, TAKE 1 TABLET BY MOUTH ONCE A DAY, Disp: 28 tablet, Rfl: 6   QUEtiapine (SEROQUEL) 100 MG tablet, Take 1 tablet (100 mg total) by mouth at bedtime., Disp: 90 tablet, Rfl: 3   simvastatin (ZOCOR) 10 MG tablet, TAKE 1 TABLET BY MOUTH EVERY NIGHT AT BEDTIME, Disp: 90 tablet, Rfl: 1   acetaminophen (TYLENOL) 500 MG tablet, Take 1,000 mg by mouth every 6 (six) hours as needed for mild pain., Disp: , Rfl:    Dextromethorphan-guaiFENesin (CORICIDIN HBP CONGESTION/COUGH PO), Take by mouth., Disp: , Rfl:    naproxen sodium (ANAPROX) 220 MG tablet, Take 440 mg by mouth daily as needed (pain)., Disp: ,  Rfl:    nicotine (NICODERM CQ - DOSED IN MG/24 HOURS) 21 mg/24hr patch, PLACE ONE PATCH (21 MG TOTAL) ONTO THE SKIN DAILY. (Patient not taking: Reported on 07/03/2018), Disp: 28 patch, Rfl: 1   Patient Care Team: Rubye Beach as PCP - General (Family Medicine)    Objective:    Vitals: BP 114/73 (BP Location: Left Arm, Patient Position: Sitting, Cuff Size: Large)    Pulse 76    Temp (!) 97.3 F (36.3 C)    Resp 16    Ht 5' 4.5" (1.638 m)    Wt 180 lb 3.2 oz (81.7 kg)    LMP 04/27/2007    BMI 30.45 kg/m    Vitals:   07/05/19 1104  BP: 114/73  Pulse: 76  Resp: 16  Temp: (!) 97.3 F (36.3 C)  Weight: 180 lb 3.2 oz (81.7 kg)  Height: 5' 4.5" (1.638 m)     Physical Exam Vitals reviewed.  Constitutional:      General: She is not in acute distress.    Appearance: Normal appearance. She is well-developed. She is obese. She is not ill-appearing or diaphoretic.  HENT:     Head: Normocephalic and atraumatic.     Right Ear: Tympanic membrane, ear canal and external ear normal.     Left Ear: Tympanic membrane, ear canal and external ear normal.  Eyes:     General: No scleral icterus.       Right eye: No discharge.        Left eye: No discharge.     Extraocular Movements: Extraocular movements intact.      Conjunctiva/sclera: Conjunctivae normal.     Pupils: Pupils are equal, round, and reactive to light.  Neck:     Thyroid: No thyromegaly.     Vascular: No carotid bruit or JVD.     Trachea: No tracheal deviation.  Cardiovascular:     Rate and Rhythm: Normal rate and regular rhythm.     Pulses: Normal pulses.     Heart sounds: Normal heart sounds. No murmur. No friction rub. No gallop.   Pulmonary:     Effort: Pulmonary effort is normal. No respiratory distress.     Breath sounds: Normal breath sounds. No wheezing or rales.  Chest:     Chest wall: No tenderness.  Abdominal:     General: Abdomen is flat. Bowel sounds are normal. There is no distension.     Palpations: Abdomen is soft. There is mass.     Tenderness: There is no abdominal tenderness. There is no guarding or rebound.    Musculoskeletal:        General: No tenderness. Normal range of motion.     Cervical back: Normal range of motion and neck supple.  Lymphadenopathy:     Cervical: No cervical adenopathy.  Skin:    General: Skin is warm and dry.     Findings: No rash.  Neurological:     Mental Status: She is alert and oriented to person, place, and time.  Psychiatric:        Mood and Affect: Mood normal.        Behavior: Behavior normal.        Thought Content: Thought content normal.        Judgment: Judgment normal.      Depression Screen PHQ 2/9 Scores 07/05/2019 07/03/2018 02/27/2018 01/02/2018  PHQ - 2 Score 5 0 0 6  PHQ- 9 Score 12 0 0 24  Exception Documentation - - - -  Assessment & Plan:     Routine Health Maintenance and Physical Exam  Exercise Activities and Dietary recommendations Goals     Exercise 150 minutes per week (moderate activity)       Immunization History  Administered Date(s) Administered   Influenza Split 01/17/2009, 01/22/2010, 03/31/2011, 01/14/2012   Influenza,inj,Quad PF,6+ Mos 04/13/2013, 05/13/2015   Td 07/03/2018   Tdap 12/07/2007    Health Maintenance    Topic Date Due   PNEUMOCOCCAL POLYSACCHARIDE VACCINE AGE 61-64 HIGH RISK  Never done   OPHTHALMOLOGY EXAM  Never done   INFLUENZA VACCINE  11/25/2018   HEMOGLOBIN A1C  01/03/2019   MAMMOGRAM  07/03/2019   FOOT EXAM  07/03/2019   PAP SMEAR-Modifier  07/03/2021   COLONOSCOPY  07/20/2021   TETANUS/TDAP  07/02/2028   Hepatitis C Screening  Completed   HIV Screening  Completed     Discussed health benefits of physical activity, and encouraged her to engage in regular exercise appropriate for her age and condition.    1. Annual physical exam Normal physical exam today. Will check labs as below and f/u pending lab results. If labs are stable and WNL she will not need to have these rechecked for one year at her next annual physical exam. She is to call the office in the meantime if she has any acute issue, questions or concerns.  2. Encounter for breast cancer screening using non-mammogram modality Mammogram being done today.   3. Essential hypertension Stable. Diagnosis pulled for medication refill. Continue current medical treatment plan. Will check labs as below and f/u pending results. - CBC with Differential/Platelet - Comprehensive metabolic panel - Hemoglobin A1c - Lipid panel - TSH - lisinopril-hydrochlorothiazide (ZESTORETIC) 20-25 MG tablet; Take 1 tablet by mouth daily.  Dispense: 90 tablet; Refill: 3 - metoprolol succinate (TOPROL-XL) 25 MG 24 hr tablet; Take 1 tablet (25 mg total) by mouth daily.  Dispense: 90 tablet; Refill: 3  4. Hypothyroidism due to acquired atrophy of thyroid Stable. Diagnosis pulled for medication refill. Continue current medical treatment plan. Will check labs as below and f/u pending results. - CBC with Differential/Platelet - Comprehensive metabolic panel - TSH - levothyroxine (SYNTHROID) 150 MCG tablet; TAKE 1 TAB BY MOUTH ONCE DAILY. TAKE ON AN EMPTY STOMACH WITH A GLASSOF WATER ATLEAST 30-60 MINUTES BEFORE BREAKFAST  Dispense: 90  tablet; Refill: 3  5. Type 2 diabetes mellitus without complication, without long-term current use of insulin (HCC) Diet controlled. Will check labs as below and f/u pending results. - CBC with Differential/Platelet - Comprehensive metabolic panel - Hemoglobin A1c - Lipid panel  6. Pure hypercholesterolemia Stable. Diagnosis pulled for medication refill. Continue current medical treatment plan. Will check labs as below and f/u pending results. - CBC with Differential/Platelet - Comprehensive metabolic panel - Hemoglobin A1c - Lipid panel - simvastatin (ZOCOR) 10 MG tablet; Take 1 tablet (10 mg total) by mouth at bedtime.  Dispense: 90 tablet; Refill: 3  7. Moderate episode of recurrent major depressive disorder (HCC) Worsening again. Continue Pristiq 100mg , seroquel 100mg  q hs. Add abilify 5mg  as below. F/U in 4 weeks.  - CBC with Differential/Platelet - Comprehensive metabolic panel - TSH - ARIPiprazole (ABILIFY) 5 MG tablet; Take 1 tablet (5 mg total) by mouth daily.  Dispense: 30 tablet; Refill: 1  8. Chronic fatigue Will check labs as below and f/u pending results. - Vitamin D (25 hydroxy)  9. Postmenopausal estrogen deficiency On HRT. Followed by Dr. Marcelline Mates, [place]. Has mammogram today. Will check labs  as below and f/u pending results. - Vitamin D (25 hydroxy)  10. Anxiety Stable. Diagnosis pulled for medication refill. Continue current medical treatment plan. - LORazepam (ATIVAN) 1 MG tablet; Take 1 tablet (1 mg total) by mouth every 8 (eight) hours as needed for anxiety.  Dispense: 90 tablet; Refill: 5  11. RLQ abdominal mass Noted on exam. Offered [place] but patient declines. Will monitor and call if any changes occur.   --------------------------------------------------------------------    Mar Daring, [name]  [place]

## 2019-07-05 ENCOUNTER — Other Ambulatory Visit: Payer: Self-pay

## 2019-07-05 ENCOUNTER — Ambulatory Visit (INDEPENDENT_AMBULATORY_CARE_PROVIDER_SITE_OTHER): Payer: 59 | Admitting: Physician Assistant

## 2019-07-05 ENCOUNTER — Encounter: Payer: Self-pay | Admitting: Physician Assistant

## 2019-07-05 ENCOUNTER — Ambulatory Visit
Admission: RE | Admit: 2019-07-05 | Discharge: 2019-07-05 | Disposition: A | Discharge status: Home / Self Care | Payer: 59 | Source: Ambulatory Visit | Attending: Physician Assistant | Admitting: Physician Assistant

## 2019-07-05 VITALS — BP 114/73 | HR 76 | Temp 97.3°F | Resp 16 | Ht 64.5 in | Wt 180.2 lb

## 2019-07-05 DIAGNOSIS — I10 Essential (primary) hypertension: Secondary | ICD-10-CM | POA: Diagnosis not present

## 2019-07-05 DIAGNOSIS — Z1239 Encounter for other screening for malignant neoplasm of breast: Secondary | ICD-10-CM | POA: Diagnosis not present

## 2019-07-05 DIAGNOSIS — Z1231 Encounter for screening mammogram for malignant neoplasm of breast: Secondary | ICD-10-CM | POA: Insufficient documentation

## 2019-07-05 DIAGNOSIS — R1903 Right lower quadrant abdominal swelling, mass and lump: Secondary | ICD-10-CM

## 2019-07-05 DIAGNOSIS — Z780 Asymptomatic menopausal state: Secondary | ICD-10-CM

## 2019-07-05 DIAGNOSIS — E034 Atrophy of thyroid (acquired): Secondary | ICD-10-CM

## 2019-07-05 DIAGNOSIS — F419 Anxiety disorder, unspecified: Secondary | ICD-10-CM

## 2019-07-05 DIAGNOSIS — E7800 Pure hypercholesterolemia, unspecified: Secondary | ICD-10-CM

## 2019-07-05 DIAGNOSIS — Z0000 Encounter for general adult medical examination without abnormal findings: Secondary | ICD-10-CM | POA: Diagnosis not present

## 2019-07-05 DIAGNOSIS — F331 Major depressive disorder, recurrent, moderate: Secondary | ICD-10-CM

## 2019-07-05 DIAGNOSIS — E559 Vitamin D deficiency, unspecified: Secondary | ICD-10-CM

## 2019-07-05 DIAGNOSIS — R5382 Chronic fatigue, unspecified: Secondary | ICD-10-CM

## 2019-07-05 DIAGNOSIS — E119 Type 2 diabetes mellitus without complications: Secondary | ICD-10-CM

## 2019-07-05 DIAGNOSIS — F3342 Major depressive disorder, recurrent, in full remission: Secondary | ICD-10-CM | POA: Insufficient documentation

## 2019-07-05 MED ORDER — LISINOPRIL-HYDROCHLOROTHIAZIDE 20-25 MG PO TABS: 1.0000 | ORAL_TABLET | Freq: Every day | ORAL | 3 refills | Status: DC

## 2019-07-05 MED ORDER — METOPROLOL SUCCINATE ER 25 MG PO TB24: 25.0000 mg | ORAL_TABLET | Freq: Every day | ORAL | 3 refills | Status: DC

## 2019-07-05 MED ORDER — SIMVASTATIN 10 MG PO TABS: 10.0000 mg | ORAL_TABLET | Freq: Every day | ORAL | 3 refills | Status: DC

## 2019-07-05 MED ORDER — LEVOTHYROXINE SODIUM 150 MCG PO TABS: ORAL_TABLET | ORAL | 3 refills | Status: DC

## 2019-07-05 MED ORDER — LORAZEPAM 1 MG PO TABS: 1.0000 mg | ORAL_TABLET | Freq: Three times a day (TID) | ORAL | 5 refills | Status: DC | PRN

## 2019-07-05 MED ORDER — ARIPIPRAZOLE 5 MG PO TABS: 5.0000 mg | ORAL_TABLET | Freq: Every day | ORAL | 1 refills | Status: DC

## 2019-07-05 NOTE — Patient Instructions (Addendum)
Health Maintenance for Postmenopausal Women Menopause is a normal process in which your ability to get pregnant comes to an end. This process happens slowly over many months or years, usually between the ages of 48 and 55. Menopause is complete when you have missed your menstrual periods for 12 months. It is important to talk with your health care provider about some of the most common conditions that affect women after menopause (postmenopausal women). These include heart disease, cancer, and bone loss (osteoporosis). Adopting a healthy lifestyle and getting preventive care can help to promote your health and wellness. The actions you take can also lower your chances of developing some of these common conditions. What should I know about menopause? During menopause, you may get a number of symptoms, such as:  Hot flashes. These can be moderate or severe.  Night sweats.  Decrease in sex drive.  Mood swings.  Headaches.  Tiredness.  Irritability.  Memory problems.  Insomnia. Choosing to treat or not to treat these symptoms is a decision that you make with your health care provider. Do I need hormone replacement therapy?  Hormone replacement therapy is effective in treating symptoms that are caused by menopause, such as hot flashes and night sweats.  Hormone replacement carries certain risks, especially as you become older. If you are thinking about using estrogen or estrogen with progestin, discuss the benefits and risks with your health care provider. What is my risk for heart disease and stroke? The risk of heart disease, heart attack, and stroke increases as you age. One of the causes may be a change in the body's hormones during menopause. This can affect how your body uses dietary fats, triglycerides, and cholesterol. Heart attack and stroke are medical emergencies. There are many things that you can do to help prevent heart disease and stroke. Watch your blood pressure  High  blood pressure causes heart disease and increases the risk of stroke. This is more likely to develop in people who have high blood pressure readings, are of African descent, or are overweight.  Have your blood pressure checked: ? Every 3-5 years if you are 18-39 years of age. ? Every year if you are 40 years old or older. Eat a healthy diet   Eat a diet that includes plenty of vegetables, fruits, low-fat dairy products, and lean protein.  Do not eat a lot of foods that are high in solid fats, added sugars, or sodium. Get regular exercise Get regular exercise. This is one of the most important things you can do for your health. Most adults should:  Try to exercise for at least 150 minutes each week. The exercise should increase your heart rate and make you sweat (moderate-intensity exercise).  Try to do strengthening exercises at least twice each week. Do these in addition to the moderate-intensity exercise.  Spend less time sitting. Even light physical activity can be beneficial. Other tips  Work with your health care provider to achieve or maintain a healthy weight.  Do not use any products that contain nicotine or tobacco, such as cigarettes, e-cigarettes, and chewing tobacco. If you need help quitting, ask your health care provider.  Know your numbers. Ask your health care provider to check your cholesterol and your blood sugar (glucose). Continue to have your blood tested as directed by your health care provider. Do I need screening for cancer? Depending on your health history and family history, you may need to have cancer screening at different stages of your life. This   may include screening for:  Breast cancer.  Cervical cancer.  Lung cancer.  Colorectal cancer. What is my risk for osteoporosis? After menopause, you may be at increased risk for osteoporosis. Osteoporosis is a condition in which bone destruction happens more quickly than new bone creation. To help prevent  osteoporosis or the bone fractures that can happen because of osteoporosis, you may take the following actions:  If you are 22-34 years old, get at least 1,000 mg of calcium and at least 600 mg of vitamin D per day.  If you are older than age 14 but younger than age 25, get at least 1,200 mg of calcium and at least 600 mg of vitamin D per day.  If you are older than age 2, get at least 1,200 mg of calcium and at least 800 mg of vitamin D per day. Smoking and drinking excessive alcohol increase the risk of osteoporosis. Eat foods that are rich in calcium and vitamin D, and do weight-bearing exercises several times each week as directed by your health care provider. How does menopause affect my mental health? Depression may occur at any age, but it is more common as you become older. Common symptoms of depression include:  Low or sad mood.  Changes in sleep patterns.  Changes in appetite or eating patterns.  Feeling an overall lack of motivation or enjoyment of activities that you previously enjoyed.  Frequent crying spells. Talk with your health care provider if you think that you are experiencing depression. General instructions See your health care provider for regular wellness exams and vaccines. This may include:  Scheduling regular health, dental, and eye exams.  Getting and maintaining your vaccines. These include: ? Influenza vaccine. Get this vaccine each year before the flu season begins. ? Pneumonia vaccine. ? Shingles vaccine. ? Tetanus, diphtheria, and pertussis (Tdap) booster vaccine. Your health care provider may also recommend other immunizations. Tell your health care provider if you have ever been abused or do not feel safe at home. Summary  Menopause is a normal process in which your ability to get pregnant comes to an end.  This condition causes hot flashes, night sweats, decreased interest in sex, mood swings, headaches, or lack of sleep.  Treatment for this  condition may include hormone replacement therapy.  Take actions to keep yourself healthy, including exercising regularly, eating a healthy diet, watching your weight, and checking your blood pressure and blood sugar levels.  Get screened for cancer and depression. Make sure that you are up to date with all your vaccines. This information is not intended to replace advice given to you by your health care provider. Make sure you discuss any questions you have with your health care provider. Document Revised: 04/05/2018 Document Reviewed: 04/05/2018 Elsevier Patient Education  [place].   Aripiprazole tablets What is this medicine? ARIPIPRAZOLE (ay ri PIP ray zole) is an atypical antipsychotic. It is used to treat schizophrenia and bipolar disorder, also known as manic-depression. It is also used to treat Tourette's disorder and some symptoms of autism. This medicine may also be used in combination with antidepressants to treat major depressive disorder. This medicine may be used for other purposes; ask your health care provider or pharmacist if you have questions. COMMON BRAND NAME(S): Abilify What should I tell my health care provider before I take this medicine? They need to know if you have any of these conditions:  dehydration  dementia  diabetes  heart disease  history of stroke  low blood counts, like low white cell, platelet, or red cell counts  Parkinson's disease  seizures  suicidal thoughts, plans, or attempt; a previous suicide attempt by you or a family member  an unusual or allergic reaction to aripiprazole, other medicines, foods, dyes, or preservatives  pregnant or trying to get pregnant  breast-feeding How should I use this medicine? Take this medicine by mouth with a glass of water. Follow the directions on the prescription label. You can take this medicine with or without food. Take your doses at regular intervals. Do not take your medicine more  often than directed. Do not stop taking except on the advice of your doctor or health care professional. A special MedGuide will be given to you by the pharmacist with each prescription and refill. Be sure to read this information carefully each time. Talk to your pediatrician regarding the use of this medicine in children. While this drug may be prescribed for children as young as 63 years of age for selected conditions, precautions do apply. Overdosage: If you think you have taken too much of this medicine contact a poison control center or emergency room at once. NOTE: This medicine is only for you. Do not share this medicine with others. What if I miss a dose? If you miss a dose, take it as soon as you can. If it is almost time for your next dose, take only that dose. Do not take double or extra doses. What may interact with this medicine? Do not take this medicine with any of the following medications:  brexpiprazole  cisapride  dronedarone  metoclopramide  pimozide  thioridazine This medicine may also interact with the following medications:  alcohol  carbamazepine  certain medicines for anxiety or sleep  certain medicines for blood pressure  certain medicines for fungal infections like ketoconazole, fluconazole, posaconazole, and itraconazole  clarithromycin  dofetilide  fluoxetine  other medicines that prolong the QT interval (cause an abnormal heart rhythm)  paroxetine  quinidine  rifampin  ziprasidone This list may not describe all possible interactions. Give your health care provider a list of all the medicines, herbs, non-prescription drugs, or dietary supplements you use. Also tell them if you smoke, drink alcohol, or use illegal drugs. Some items may interact with your medicine. What should I watch for while using this medicine? Visit your health care professional for regular checks on your progress. Tell your health care professional if symptoms do not  start to get better or if they get worse. Do not stop taking except on your health care professional's advice. You may develop a severe reaction. Your health care professional will tell you how much medicine to take. Patients and their families should watch out for new or worsening depression or thoughts of suicide. Also watch out for sudden changes in feelings such as feeling anxious, agitated, panicky, irritable, hostile, aggressive, impulsive, severely restless, overly excited and hyperactive, or not being able to sleep. If this happens, especially at the beginning of antidepressant treatment or after a change in dose, call your health care professional. Dennis Bast may get dizzy or drowsy. Do not drive, use machinery, or do anything that needs mental alertness until you know how this medicine affects you. Do not stand or sit up quickly, especially if you are an older patient. This reduces the risk of dizzy or fainting spells. Alcohol may interfere with the effect of this medicine. Avoid alcoholic drinks. This drug can cause problems with controlling your body temperature. It can lower  the response of your body to cold temperatures. If possible, stay indoors during cold weather. If you must go outdoors, wear warm clothes. It can also lower the response of your body to heat. Do not overheat. Do not over-exercise. Stay out of the sun when possible. If you must be in the sun, wear cool clothing. Drink plenty of water. If you have trouble controlling your body temperature, call your health care provider right away. This medicine may cause dry eyes and blurred vision. If you wear contact lenses, you may feel some discomfort. Lubricating drops may help. See your eye doctor if the problem does not go away or is severe. This medicine may increase blood sugar. Ask your health care provider if changes in diet or medicines are needed if you have diabetes. There are have been reports of increased sexual urges or other strong  urges such as gambling while taking this medicine. If you experience any of these while taking this medicine, you should report this to your health care professional as soon as possible. What side effects may I notice from receiving this medicine? Side effects that you should report to your doctor or health care professional as soon as possible:  allergic reactions like skin rash, itching or hives, swelling of the face, lips, or tongue  breathing problems  confusion  fast, irregular heartbeat  fever or chills, sore throat  inability to keep still  males: prolonged or painful erection  new or increased gambling urges, sexual urges, uncontrolled spending, binge or compulsive eating, or other urges  problems with balance, talking, walking  seizures  signs and symptoms of high blood sugar such as being more thirsty or hungry or having to urinate more than normal. You may also feel very tired or have blurry vision  signs and symptoms of low blood pressure like dizziness; feeling faint or lightheaded, falls; unusually weak or tired  signs and symptoms of neuroleptic malignant syndrome like confusion; fast or irregular heartbeat; high fever; increased sweating; stiff muscles  sudden numbness or weakness of the face, arm, or leg  suicidal thoughts or other mood changes  trouble swallowing  uncontrollable movements of the arms, face, head, mouth, neck, or upper body Side effects that usually do not require medical attention (report to your doctor or health care professional if they continue or are bothersome):  constipation  headache  nausea, vomiting  trouble sleeping  weight gain This list may not describe all possible side effects. Call your doctor for medical advice about side effects. You may report side effects to FDA at 1-800-FDA-1088. Where should I keep my medicine? Keep out of the reach of children. Store at room temperature between 15 and 30 degrees C (59 and 86  degrees F). Throw away any unused medicine after the expiration date. NOTE: This sheet is a summary. It may not cover all possible information. If you have questions about this medicine, talk to your doctor, pharmacist, or health care provider.  2020 Elsevier/Gold Standard (2019-02-06 16:17:22)

## 2019-07-06 ENCOUNTER — Telehealth: Payer: Self-pay

## 2019-07-06 DIAGNOSIS — E559 Vitamin D deficiency, unspecified: Secondary | ICD-10-CM

## 2019-07-06 DIAGNOSIS — E039 Hypothyroidism, unspecified: Secondary | ICD-10-CM

## 2019-07-06 LAB — LIPID PANEL
Chol/HDL Ratio: 3.3 ratio (ref 0.0–4.4)
Cholesterol, Total: 162 mg/dL (ref 100–199)
HDL: 49 mg/dL (ref >39)
LDL Chol Calc (NIH): 86 mg/dL (ref 0–99)
Triglycerides: 158 mg/dL — ABNORMAL HIGH (ref 0–149)
VLDL Cholesterol Cal: 27 mg/dL (ref 5–40)

## 2019-07-06 LAB — COMPREHENSIVE METABOLIC PANEL
ALT: 13 IU/L (ref 0–32)
AST: 15 IU/L (ref 0–40)
Albumin/Globulin Ratio: 1.8 (ref 1.2–2.2)
Albumin: 4.2 g/dL (ref 3.8–4.8)
Alkaline Phosphatase: 51 IU/L (ref 39–117)
BUN/Creatinine Ratio: 16 (ref 12–28)
BUN: 12 mg/dL (ref 8–27)
Bilirubin Total: 0.6 mg/dL (ref 0.0–1.2)
CO2: 21 mmol/L (ref 20–29)
Calcium: 9.4 mg/dL (ref 8.7–10.3)
Chloride: 100 mmol/L (ref 96–106)
Creatinine, Ser: 0.77 mg/dL (ref 0.57–1.00)
GFR calc Af Amer: 96 mL/min/{1.73_m2} (ref >59)
GFR calc non Af Amer: 84 mL/min/{1.73_m2} (ref >59)
Globulin, Total: 2.4 g/dL (ref 1.5–4.5)
Glucose: 113 mg/dL — ABNORMAL HIGH (ref 65–99)
Potassium: 3.9 mmol/L (ref 3.5–5.2)
Sodium: 138 mmol/L (ref 134–144)
Total Protein: 6.6 g/dL (ref 6.0–8.5)

## 2019-07-06 LAB — TSH: TSH: 10.4 u[IU]/mL — ABNORMAL HIGH (ref 0.450–4.500)

## 2019-07-06 LAB — VITAMIN D 25 HYDROXY (VIT D DEFICIENCY, FRACTURES): Vit D, 25-Hydroxy: 20.6 ng/mL — ABNORMAL LOW (ref 30.0–100.0)

## 2019-07-06 LAB — CBC WITH DIFFERENTIAL/PLATELET
Basophils Absolute: 0.1 10*3/uL (ref 0.0–0.2)
Basos: 1 %
EOS (ABSOLUTE): 0.1 10*3/uL (ref 0.0–0.4)
Eos: 1 %
Hematocrit: 44.3 % (ref 34.0–46.6)
Hemoglobin: 15.4 g/dL (ref 11.1–15.9)
Immature Grans (Abs): 0 10*3/uL (ref 0.0–0.1)
Immature Granulocytes: 0 %
Lymphocytes Absolute: 1.9 10*3/uL (ref 0.7–3.1)
Lymphs: 23 %
MCH: 31.8 pg (ref 26.6–33.0)
MCHC: 34.8 g/dL (ref 31.5–35.7)
MCV: 92 fL (ref 79–97)
Monocytes Absolute: 0.4 10*3/uL (ref 0.1–0.9)
Monocytes: 4 %
Neutrophils Absolute: 6.1 10*3/uL (ref 1.4–7.0)
Neutrophils: 71 %
Platelets: 133 10*3/uL — ABNORMAL LOW (ref 150–450)
RBC: 4.84 x10E6/uL (ref 3.77–5.28)
RDW: 12.1 % (ref 11.7–15.4)
WBC: 8.5 10*3/uL (ref 3.4–10.8)

## 2019-07-06 LAB — HEMOGLOBIN A1C
Est. average glucose Bld gHb Est-mCnc: 123 mg/dL
Hgb A1c MFr Bld: 5.9 % — ABNORMAL HIGH (ref 4.8–5.6)

## 2019-07-06 MED ORDER — VITAMIN D (ERGOCALCIFEROL) 1.25 MG (50000 UNIT) PO CAPS: 50000.0000 [IU] | ORAL_CAPSULE | ORAL | 1 refills | Status: DC

## 2019-07-06 MED ORDER — LEVOTHYROXINE SODIUM 175 MCG PO TABS: 175.0000 ug | ORAL_TABLET | Freq: Every day | ORAL | 1 refills | Status: DC

## 2019-07-06 NOTE — Telephone Encounter (Signed)
Patient advised as directed below. Labs ordered for the 3 month check.

## 2019-07-06 NOTE — Telephone Encounter (Signed)
Patient advised as directed below. 

## 2019-07-06 NOTE — Telephone Encounter (Signed)
-----   Message from Mar Daring, [name] sent at 07/05/2019  6:30 PM EST ----- Normal mammogram. Repeat screening in one year.

## 2019-07-06 NOTE — Telephone Encounter (Signed)
-----   Message from Mar Daring, [name] sent at 07/06/2019  8:50 AM EST ----- Blood count is normal. Kidney and liver function are normal. Sodium, potassium and calcium are normal. A1c has improved from 6.0 to 5.9 now. Cholesterol si doing good. Thyroid does indicate under-corrected hypothyroidism. Would increase levothyroxine from 166mcg to 18mcg if you have been taking the 171mcg regularly. Vit D is also low. Would recommend high dose Vit D once weekly. Will send in levothyroxine and Vit D. Recheck both in 3 months.

## 2019-07-06 NOTE — Addendum Note (Signed)
Addended by: Mar Daring on: 07/06/2019 08:52 AM   Modules accepted: Orders

## 2019-07-10 ENCOUNTER — Telehealth: Payer: Self-pay | Admitting: Obstetrics and Gynecology

## 2019-07-10 NOTE — Telephone Encounter (Signed)
Patient called in requesting that the prescription Quetiapine be reduced to 50mg  as she stated that it wasn't doing anything for her.

## 2019-07-11 NOTE — Telephone Encounter (Signed)
Please advise. Thanks Dakiya Puopolo 

## 2019-07-12 NOTE — Telephone Encounter (Signed)
Can she break the tablets in half? If not, you can send in a new prescription for the 50 mg tablets.

## 2019-07-13 NOTE — Telephone Encounter (Signed)
Pt called no answer and no vm picked up.  

## 2019-07-16 ENCOUNTER — Telehealth: Payer: Self-pay | Admitting: Obstetrics and Gynecology

## 2019-07-16 NOTE — Telephone Encounter (Signed)
Patient called returning missed phone call from nurse regarding medication question.

## 2019-07-17 NOTE — Telephone Encounter (Signed)
Dr. Cherry, Please advise. Thanks Cherilyn Sautter 

## 2019-07-17 NOTE — Telephone Encounter (Signed)
She should wean down, starting by taking half of her current dose for 1-2 weeks, then can discontinue. Stopping suddenly can lead to withdrawal symptoms.

## 2019-07-17 NOTE — Telephone Encounter (Signed)
Called pt to give information concerning medication Quetiapine. Pt stated that another provider started her on Abilify and it is working wonderful for the pt and she has been on it for 2 weeks now.  Pt is wondering if she could stop taking the medication Quetiapine. Please advise.

## 2019-07-18 NOTE — Telephone Encounter (Signed)
Pt is aware that [facility] would like pt to wean down on the medication Quetiapine taking a half dose for 1-2 weeks and then stop totally. Pt voiced that she understood.

## 2019-07-24 NOTE — Progress Notes (Signed)
Patient: Regina Pierce Female    DOB: 1955/06/22   64 y.o.   MRN: [id number] Visit Date: 07/26/2019  Today's Provider: Mar Daring, [name]   No chief complaint on file.  Subjective:     Virtual Visit via Telephone Note  I connected with Cathlean Marseilles on 07/26/19 at  9:20 AM EDT by telephone and verified that I am speaking with the correct person using two identifiers.  Location: Patient: Home Provider: [facility]   I discussed the limitations, risks, security and privacy concerns of performing an evaluation and management service by telephone and the availability of in person appointments. I also discussed with the patient that there may be a patient responsible charge related to this service. The patient expressed understanding and agreed to proceed.  HPI   Depression, Follow up:  The patient was last seen for Major Depression 4 weeks ago. Changes made since that visit include Continue Pristiq 100mg , seroquel 100mg  q hs.Abilify 5mg  was added.  She reports excellent compliance with treatment. She is not having side effects. .   She complains of none. Reports that she feels better and that she is having a lot of energy.     No Known Allergies   Current Outpatient Medications:  .  ARIPiprazole (ABILIFY) 5 MG tablet, Take 1 tablet (5 mg total) by mouth daily., Disp: 30 tablet, Rfl: 1 .  aspirin 81 MG tablet, Take 81 mg by mouth every evening. , Disp: , Rfl:  .  desvenlafaxine (PRISTIQ) 100 MG 24 hr tablet, Take 1 tablet (100 mg total) by mouth daily., Disp: 90 tablet, Rfl: 3 .  levothyroxine (SYNTHROID) 175 MCG tablet, Take 1 tablet (175 mcg total) by mouth daily before breakfast., Disp: 90 tablet, Rfl: 1 .  lisinopril-hydrochlorothiazide (ZESTORETIC) 20-25 MG tablet, Take 1 tablet by mouth daily., Disp: 90 tablet, Rfl: 3 .  LORazepam (ATIVAN) 1 MG tablet, Take 1 tablet (1 mg total) by mouth every 8 (eight) hours as needed for anxiety., Disp: 90 tablet, Rfl: 5 .   metoprolol succinate (TOPROL-XL) 25 MG 24 hr tablet, Take 1 tablet (25 mg total) by mouth daily., Disp: 90 tablet, Rfl: 3 .  naproxen sodium (ANAPROX) 220 MG tablet, Take 440 mg by mouth daily as needed (pain)., Disp: , Rfl:  .  norethindrone-ethinyl estradiol (FEMHRT LOW DOSE) 0.5-2.5 MG-MCG tablet, TAKE 1 TABLET BY MOUTH ONCE A DAY, Disp: 28 tablet, Rfl: 6 .  QUEtiapine (SEROQUEL) 100 MG tablet, Take 1 tablet (100 mg total) by mouth at bedtime., Disp: 90 tablet, Rfl: 3 .  simvastatin (ZOCOR) 10 MG tablet, Take 1 tablet (10 mg total) by mouth at bedtime., Disp: 90 tablet, Rfl: 3 .  Vitamin D, Ergocalciferol, (DRISDOL) 1.25 MG (50000 UNIT) CAPS capsule, Take 1 capsule (50,000 Units total) by mouth every 7 (seven) days., Disp: 12 capsule, Rfl: 1 .  acetaminophen (TYLENOL) 500 MG tablet, Take 1,000 mg by mouth every 6 (six) hours as needed for mild pain., Disp: , Rfl:   Review of Systems  Constitutional: Negative.   Respiratory: Negative.   Cardiovascular: Negative.   Gastrointestinal: Negative.   Neurological: Negative.   Psychiatric/Behavioral: Negative.     Social History   Tobacco Use  . Smoking status: Current Every Day Smoker    Packs/day: 1.00    Years: 30.00    Pack years: 30.00    Types: Cigarettes    Last attempt to quit: 01/24/2017    Years since quitting: 2.5  .  Smokeless tobacco: Never Used  Substance Use Topics  . Alcohol use: No    Alcohol/week: 0.0 standard drinks      Objective:   LMP 04/27/2007  There were no vitals filed for this visit.There is no height or weight on file to calculate BMI.   Physical Exam Vitals reviewed.  Constitutional:      General: She is not in acute distress. Pulmonary:     Effort: No respiratory distress.  Neurological:     Mental Status: She is alert.  Psychiatric:        Mood and Affect: Mood normal.        Behavior: Behavior normal.        Thought Content: Thought content normal.        Judgment: Judgment normal.     No  results found for any visits on 07/26/19.     Assessment & Plan     1. Moderate episode of recurrent major depressive disorder ([place]) Much improved. Continue current medical treatment plan. May try to increase Abilify to 7.5mg  (1.5 tabs). If this causes increased energy and difficulty sleeping she may decrease to 2.5mg  (0.5 tab). She will call with which dose is helping her the best.  - ARIPiprazole (ABILIFY) 5 MG tablet; Take 1 tablet (5 mg total) by mouth daily.  Dispense: 90 tablet; Refill: 3 - desvenlafaxine (PRISTIQ) 100 MG 24 hr tablet; Take 1 tablet (100 mg total) by mouth daily.  Dispense: 90 tablet; Refill: 3  2. Encounter for smoking cessation counseling Patient still desires to try to quit smoking. Smoking cessation was discussed for approx 6 minutes. She is going to try the gum and when she has a craving for a cigarette she is to chew the gum instead. She voiced understanding.  - nicotine polacrilex (GNP NICOTINE) 4 MG gum; Take 1 each (4 mg total) by mouth as needed for smoking cessation.  Dispense: 100 tablet; Refill: 0   I discussed the assessment and treatment plan with the patient. The patient was provided an opportunity to ask questions and all were answered. The patient agreed with the plan and demonstrated an understanding of the instructions.   The patient was advised to call back or seek an in-person evaluation if the symptoms worsen or if the condition fails to improve as anticipated.  I provided 21 minutes of non-face-to-face time during this encounter.    Mar Daring, [name]  [facility]

## 2019-07-26 ENCOUNTER — Encounter: Payer: Self-pay | Admitting: Physician Assistant

## 2019-07-26 ENCOUNTER — Ambulatory Visit (INDEPENDENT_AMBULATORY_CARE_PROVIDER_SITE_OTHER): Payer: 59 | Admitting: Physician Assistant

## 2019-07-26 VITALS — BP 136/62 | HR 80 | Temp 98.1°F

## 2019-07-26 DIAGNOSIS — Z716 Tobacco abuse counseling: Secondary | ICD-10-CM | POA: Diagnosis not present

## 2019-07-26 DIAGNOSIS — F17210 Nicotine dependence, cigarettes, uncomplicated: Secondary | ICD-10-CM | POA: Diagnosis not present

## 2019-07-26 DIAGNOSIS — F331 Major depressive disorder, recurrent, moderate: Secondary | ICD-10-CM

## 2019-07-26 MED ORDER — NICOTINE POLACRILEX 4 MG MT GUM: 4.0000 mg | CHEWING_GUM | OROMUCOSAL | 0 refills | Status: DC | PRN

## 2019-07-26 MED ORDER — ARIPIPRAZOLE 5 MG PO TABS: 5.0000 mg | ORAL_TABLET | Freq: Every day | ORAL | 3 refills | Status: DC

## 2019-07-26 MED ORDER — DESVENLAFAXINE SUCCINATE ER 100 MG PO TB24: 100.0000 mg | ORAL_TABLET | Freq: Every day | ORAL | 3 refills | Status: DC

## 2019-08-23 ENCOUNTER — Telehealth: Payer: Self-pay | Admitting: Physician Assistant

## 2019-08-23 NOTE — Telephone Encounter (Signed)
Pt given recommendations per Fenton Malling, "Magnesium is normally 250-500mg  daily. Can cause looser stools. B12 comes in many varieties from 50-1051mcg. Any of these are ok. Vit C is 75mg . You can add 35mg  to that for a total of 110mg  if you are smoking"; the pt asked about how she should take the medicine; pt advised to follow the instructions listed on medication bottles; she verbalized understanding; will route to office for notification of encounter.

## 2019-08-23 NOTE — Telephone Encounter (Signed)
LMTCB-If patient calls back OK for the [facility] to give message about the Vitamins as directed on the other message thanks.

## 2019-08-23 NOTE — Telephone Encounter (Signed)
Magnesium is normally 250-500mg  daily. Can cause looser stools. B12 comes in many varieties from 50-1075mcg. Any of these are ok.  Vit C is 75mg . You can add 35mg  to that for a total of 110mg  if you are smoking

## 2019-08-23 NOTE — Telephone Encounter (Signed)
Pt called to ask PCP about how much Magnesium, B12 and vitamin C supplement she can take / please advise

## 2019-08-27 ENCOUNTER — Other Ambulatory Visit: Payer: Self-pay | Admitting: Obstetrics and Gynecology

## 2019-09-06 ENCOUNTER — Encounter: Payer: Self-pay | Admitting: Obstetrics and Gynecology

## 2019-09-06 ENCOUNTER — Ambulatory Visit: Payer: Self-pay

## 2019-09-06 ENCOUNTER — Other Ambulatory Visit: Payer: Self-pay

## 2019-09-06 ENCOUNTER — Ambulatory Visit (INDEPENDENT_AMBULATORY_CARE_PROVIDER_SITE_OTHER): Payer: 59 | Admitting: Obstetrics and Gynecology

## 2019-09-06 VITALS — BP 146/79 | HR 78 | Ht 64.5 in | Wt 179.6 lb

## 2019-09-06 DIAGNOSIS — F419 Anxiety disorder, unspecified: Secondary | ICD-10-CM | POA: Diagnosis not present

## 2019-09-06 DIAGNOSIS — Z8742 Personal history of other diseases of the female genital tract: Secondary | ICD-10-CM | POA: Diagnosis not present

## 2019-09-06 DIAGNOSIS — Z01419 Encounter for gynecological examination (general) (routine) without abnormal findings: Secondary | ICD-10-CM | POA: Diagnosis not present

## 2019-09-06 DIAGNOSIS — Z79890 Hormone replacement therapy (postmenopausal): Secondary | ICD-10-CM | POA: Diagnosis not present

## 2019-09-06 DIAGNOSIS — E119 Type 2 diabetes mellitus without complications: Secondary | ICD-10-CM

## 2019-09-06 DIAGNOSIS — F329 Major depressive disorder, single episode, unspecified: Secondary | ICD-10-CM

## 2019-09-06 DIAGNOSIS — D171 Benign lipomatous neoplasm of skin and subcutaneous tissue of trunk: Secondary | ICD-10-CM

## 2019-09-06 DIAGNOSIS — F32A Depression, unspecified: Secondary | ICD-10-CM

## 2019-09-06 NOTE — Telephone Encounter (Signed)
3rd attempt to reach patient at number provided. Left message to call back to office number- call forwarded as no contact call.

## 2019-09-06 NOTE — Progress Notes (Signed)
Pt present for annual exam. Pt's last pap 07/04/2018 and last mammogram 07/05/2019. Pt stated that she is doing a lot better than her last visit; still having issues sleeping.

## 2019-09-06 NOTE — Telephone Encounter (Signed)
Yes it can cause these symptoms. She is due for lab recheck. Recommend to come have drawn before changing.

## 2019-09-06 NOTE — Patient Instructions (Addendum)
Preventive Care 40-62 Years Old, Female Preventive care refers to visits with your health care provider and lifestyle choices that can promote health and wellness. This includes:  A yearly physical exam. This may also be called an annual well check.  Regular dental visits and eye exams.  Immunizations.  Screening for certain conditions.  Healthy lifestyle choices, such as eating a healthy diet, getting regular exercise, not using drugs or products that contain nicotine and tobacco, and limiting alcohol use. What can I expect for my preventive care visit? Physical exam Your health care provider will check your:  Height and weight. This may be used to calculate body mass index (BMI), which tells if you are at a healthy weight.  Heart rate and blood pressure.  Skin for abnormal spots. Counseling Your health care provider may ask you questions about your:  Alcohol, tobacco, and drug use.  Emotional well-being.  Home and relationship well-being.  Sexual activity.  Eating habits.  Work and work environment.  Method of birth control.  Menstrual cycle.  Pregnancy history. What immunizations do I need?  Influenza (flu) vaccine  This is recommended every year. Tetanus, diphtheria, and pertussis (Tdap) vaccine  You may need a Td booster every 10 years. Varicella (chickenpox) vaccine  You may need this if you have not been vaccinated. Zoster (shingles) vaccine  You may need this after age 60. Measles, mumps, and rubella (MMR) vaccine  You may need at least one dose of MMR if you were born in 1957 or later. You may also need a second dose. Pneumococcal conjugate (PCV13) vaccine  You may need this if you have certain conditions and were not previously vaccinated. Pneumococcal polysaccharide (PPSV23) vaccine  You may need one or two doses if you smoke cigarettes or if you have certain conditions. Meningococcal conjugate (MenACWY) vaccine  You may need this if you  have certain conditions. Hepatitis A vaccine  You may need this if you have certain conditions or if you travel or work in places where you may be exposed to hepatitis A. Hepatitis B vaccine  You may need this if you have certain conditions or if you travel or work in places where you may be exposed to hepatitis B. Haemophilus influenzae type b (Hib) vaccine  You may need this if you have certain conditions. Human papillomavirus (HPV) vaccine  If recommended by your health care provider, you may need three doses over 6 months. You may receive vaccines as individual doses or as more than one vaccine together in one shot (combination vaccines). Talk with your health care provider about the risks and benefits of combination vaccines. What tests do I need? Blood tests  Lipid and cholesterol levels. These may be checked every 5 years, or more frequently if you are over 50 years old.  Hepatitis C test.  Hepatitis B test. Screening  Lung cancer screening. You may have this screening every year starting at age 55 if you have a 30-pack-year history of smoking and currently smoke or have quit within the past 15 years.  Colorectal cancer screening. All adults should have this screening starting at age 50 and continuing until age 75. Your health care provider may recommend screening at age 45 if you are at increased risk. You will have tests every 1-10 years, depending on your results and the type of screening test.  Diabetes screening. This is done by checking your blood sugar (glucose) after you have not eaten for a while (fasting). You may have this   done every 1-3 years.  Mammogram. This may be done every 1-2 years. Talk with your health care provider about when you should start having regular mammograms. This may depend on whether you have a family history of breast cancer.  BRCA-related cancer screening. This may be done if you have a family history of breast, ovarian, tubal, or peritoneal  cancers.  Pelvic exam and Pap test. This may be done every 3 years starting at age 21. Starting at age 48, this may be done every 5 years if you have a Pap test in combination with an HPV test. Other tests  Sexually transmitted disease (STD) testing.  Bone density scan. This is done to screen for osteoporosis. You may have this scan if you are at high risk for osteoporosis. Follow these instructions at home: Eating and drinking  Eat a diet that includes fresh fruits and vegetables, whole grains, lean protein, and low-fat dairy.  Take vitamin and mineral supplements as recommended by your health care provider.  Do not drink alcohol if: ? Your health care provider tells you not to drink. ? You are pregnant, may be pregnant, or are planning to become pregnant.  If you drink alcohol: ? Limit how much you have to 0-1 drink a day. ? Be aware of how much alcohol is in your drink. In the [place] one drink equals one 12 oz bottle of beer (355 mL), one 5 oz glass of wine (148 mL), or one 1 oz glass of hard liquor (44 mL). Lifestyle  Take daily care of your teeth and gums.  Stay active. Exercise for at least 30 minutes on 5 or more days each week.  Do not use any products that contain nicotine or tobacco, such as cigarettes, e-cigarettes, and chewing tobacco. If you need help quitting, ask your health care provider.  If you are sexually active, practice safe sex. Use a condom or other form of birth control (contraception) in order to prevent pregnancy and STIs (sexually transmitted infections).  If told by your health care provider, take low-dose aspirin daily starting at age 29. What's next?  Visit your health care provider once a year for a well check visit.  Ask your health care provider how often you should have your eyes and teeth checked.  Stay up to date on all vaccines. This information is not intended to replace advice given to you by your health care provider. Make sure you  discuss any questions you have with your health care provider. Document Revised: 12/22/2017 Document Reviewed: 12/22/2017 Elsevier Patient Education  2020 [employer] Breast self-awareness is knowing how your breasts look and feel. Doing breast self-awareness is important. It allows you to catch a breast problem early while it is still small and can be treated. All women should do breast self-awareness, including women who have had breast implants. Tell your doctor if you notice a change in your breasts. What you need:  A mirror.  A well-lit room. How to do a breast self-exam A breast self-exam is one way to learn what is normal for your breasts and to check for changes. To do a breast self-exam: Look for changes  1. Take off all the clothes above your waist. 2. Stand in front of a mirror in a room with good lighting. 3. Put your hands on your hips. 4. Push your hands down. 5. Look at your breasts and nipples in the mirror to see if one breast or nipple looks different from  the other. Check to see if: ? The shape of one breast is different. ? The size of one breast is different. ? There are wrinkles, dips, and bumps in one breast and not the other. 6. Look at each breast for changes in the skin, such as: ? Redness. ? Scaly areas. 7. Look for changes in your nipples, such as: ? Liquid around the nipples. ? Bleeding. ? Dimpling. ? Redness. ? A change in where the nipples are. Feel for changes  1. Lie on your back on the floor. 2. Feel each breast. To do this, follow these steps: ? Pick a breast to feel. ? Put the arm closest to that breast above your head. ? Use your other arm to feel the nipple area of your breast. Feel the area with the pads of your three middle fingers by making small circles with your fingers. For the first circle, press lightly. For the second circle, press harder. For the third circle, press even harder. ? Keep making circles with  your fingers at the different pressures as you move down your breast. Stop when you feel your ribs. ? Move your fingers a little toward the center of your body. ? Start making circles with your fingers again, this time going up until you reach your collarbone. ? Keep making up-and-down circles until you reach your armpit. Remember to keep using the three pressures. ? Feel the other breast in the same way. 3. Sit or stand in the tub or shower. 4. With soapy water on your skin, feel each breast the same way you did in step 2 when you were lying on the floor. Write down what you find Writing down what you find can help you remember what to tell your doctor. Write down:  What is normal for each breast.  Any changes you find in each breast, including: ? The kind of changes you find. ? Whether you have pain. ? Size and location of any lumps.  When you last had your menstrual period. General tips  Check your breasts every month.  If you are breastfeeding, the best time to check your breasts is after you feed your baby or after you use a breast pump.  If you get menstrual periods, the best time to check your breasts is 5-7 days after your menstrual period is over.  With time, you will become comfortable with the self-exam, and you will begin to know if there are changes in your breasts. Contact a doctor if you:  See a change in the shape or size of your breasts or nipples.  See a change in the skin of your breast or nipples, such as red or scaly skin.  Have fluid coming from your nipples that is not normal.  Find a lump or thick area that was not there before.  Have pain in your breasts.  Have any concerns about your breast health. Summary  Breast self-awareness includes looking for changes in your breasts, as well as feeling for changes within your breasts.  Breast self-awareness should be done in front of a mirror in a well-lit room.  You should check your breasts every month.  If you get menstrual periods, the best time to check your breasts is 5-7 days after your menstrual period is over.  Let your doctor know of any changes you see in your breasts, including changes in size, changes on the skin, pain or tenderness, or fluid from your nipples that is not normal. This information is  intended to replace advice given to you by your health care provider. Make sure you discuss any questions you have with your health care provider. Document Revised: 11/29/2017 Document Reviewed: 11/29/2017 Elsevier Patient Education  2020 Elsevier Inc.  

## 2019-09-06 NOTE — Telephone Encounter (Signed)
Pt advised.  Lab orders at the front desk.   Thanks,   -Mickel Baas

## 2019-09-06 NOTE — Progress Notes (Signed)
ANNUAL PREVENTATIVE CARE GYNECOLOGY  ENCOUNTER NOTE  Subjective:       Regina Pierce is a 64 y.o. [phone] menopausal  female here for a routine annual gynecologic exam. The patient is not sexually active. The patient is taking hormone replacement therapy. Patient denies post-menopausal vaginal bleeding. The patient wears seatbelts: yes. The patient participates in regular exercise: yes (walking). Has the patient ever been transfused or tattooed?: no.   Current complaints: 1.  Notes she is doing much better with her mood on current medications. Still noting some sleep issues. Will f/u with PCP.   2. Patient wonders if she should discontinue her HRT. She denies any issues with the medication, but would to reduce the amount of medications she is taking.  She states that now that her mood has improved with her new medication regimen, questions if the medication is still needed.    3. Sheenah also reports a lump on the right side of her abdomen, non-tender.  Has been there for a few months.  Has not changed in size. Is unsure if it is a hernia.  Has mentioned this to her PCP, states she thinks she may order an ultrasound.    Gynecologic History Patient's last menstrual period was 04/27/2007. Contraception: post menopausal status Last Pap: 07/04/2018. Results were: normal. Previous h/o abnormal pap smear in 2018 with LGSIL pap smear, followed by colposcopy. Pap smear in 2019 ASCUS HR HPV neg. Last mammogram: 07/05/2019. Results were: normal. Last Colonoscopy: 2018. Results were: normal (except diverticulosis noted). Recommended 5 year follow up Last Dexa Scan: patient has never had one.   Obstetric History OB History  Gravida Para Term Preterm AB Living  2 2 2     2   SAB TAB Ectopic Multiple Live Births          2    # Outcome Date GA Lbr Len/2nd Weight Sex Delivery Anes PTL Lv  2 Term      Vag-Spont   LIV  1 Term      Vag-Spont   LIV    Past Medical History:  Diagnosis Date  . Anxiety    . Depression   . Diabetes mellitus without complication (HCC)    no meds  . Hyperlipidemia   . Hypertension   . Thyroid disease     Family History  Problem Relation Age of Onset  . Hypertension Sister   . Breast cancer Neg Hx     Past Surgical History:  Procedure Laterality Date  . ACHILLES TENDON SURGERY Left 05/12/2016   Procedure: ACHILLES TENDON REPAIR;  Surgeon: Samara Deist, DPM;  Location: [facility] ORS;  Service: Podiatry;  Laterality: Left;  . BONE EXOSTOSIS EXCISION Left 05/12/2016   Procedure: CALCANEAL EXOSTECTOMY AND DORSAL EXOSTECTOMY;  Surgeon: Samara Deist, DPM;  Location: [facility] ORS;  Service: Podiatry;  Laterality: Left;  . COLONOSCOPY WITH PROPOFOL N/A 07/20/2016   Procedure: COLONOSCOPY WITH PROPOFOL;  Surgeon: Jonathon Bellows, MD;  Location: [facility] ENDOSCOPY;  Service: Endoscopy;  Laterality: N/A;  . HAND SURGERY Bilateral 1999   carpal tunnel   . KNEE SURGERY  2010  . TUBAL LIGATION  1987    Social History   Socioeconomic History  . Marital status: Married    Spouse name: Not on file  . Number of children: Not on file  . Years of education: Not on file  . Highest education level: Not on file  Occupational History  . Not on file  Tobacco Use  . Smoking status:  Current Every Day Smoker    Packs/day: 1.00    Years: 30.00    Pack years: 30.00    Types: Cigarettes    Last attempt to quit: 01/24/2017    Years since quitting: 2.6  . Smokeless tobacco: Never Used  Substance and Sexual Activity  . Alcohol use: No    Alcohol/week: 0.0 standard drinks  . Drug use: No  . Sexual activity: Not Currently    Birth control/protection: Post-menopausal  Other Topics Concern  . Not on file  Social History Narrative  . Not on file   Social Determinants of Health   Financial Resource Strain:   . Difficulty of Paying Living Expenses:   Food Insecurity:   . Worried About Charity fundraiser in the Last Year:   . Arboriculturist in the Last Year:   Transportation  Needs:   . Film/video editor (Medical):   Marland Kitchen Lack of Transportation (Non-Medical):   Physical Activity:   . Days of Exercise per Week:   . Minutes of Exercise per Session:   Stress:   . Feeling of Stress :   Social Connections:   . Frequency of Communication with Friends and Family:   . Frequency of Social Gatherings with Friends and Family:   . Attends Religious Services:   . Active Member of Clubs or Organizations:   . Attends Archivist Meetings:   Marland Kitchen Marital Status:   Intimate Partner Violence:   . Fear of Current or Ex-Partner:   . Emotionally Abused:   Marland Kitchen Physically Abused:   . Sexually Abused:     Current Outpatient Medications on File Prior to Visit  Medication Sig Dispense Refill  . ARIPiprazole (ABILIFY) 5 MG tablet Take 1 tablet (5 mg total) by mouth daily. (Patient taking differently: Take 5 mg by mouth daily. Take 2.5 mg daily.) 90 tablet 3  . aspirin 81 MG tablet Take 81 mg by mouth every evening.     . desvenlafaxine (PRISTIQ) 100 MG 24 hr tablet Take 1 tablet (100 mg total) by mouth daily. 90 tablet 3  . levothyroxine (SYNTHROID) 175 MCG tablet Take 1 tablet (175 mcg total) by mouth daily before breakfast. 90 tablet 1  . lisinopril-hydrochlorothiazide (ZESTORETIC) 20-25 MG tablet Take 1 tablet by mouth daily. 90 tablet 3  . LORazepam (ATIVAN) 1 MG tablet Take 1 tablet (1 mg total) by mouth every 8 (eight) hours as needed for anxiety. 90 tablet 5  . metoprolol succinate (TOPROL-XL) 25 MG 24 hr tablet Take 1 tablet (25 mg total) by mouth daily. 90 tablet 3  . naproxen sodium (ANAPROX) 220 MG tablet Take 440 mg by mouth daily as needed (pain).    . norethindrone-ethinyl estradiol (FEMHRT LOW DOSE) 0.5-2.5 MG-MCG tablet TAKE 1 TABLET BY MOUTH ONCE A DAY 30 tablet 0  . simvastatin (ZOCOR) 10 MG tablet Take 1 tablet (10 mg total) by mouth at bedtime. 90 tablet 3  . vitamin B-12 (CYANOCOBALAMIN) 1000 MCG tablet Take 1,000 mcg by mouth daily.    . Vitamin D,  Ergocalciferol, (DRISDOL) 1.25 MG (50000 UNIT) CAPS capsule Take 1 capsule (50,000 Units total) by mouth every 7 (seven) days. 12 capsule 1  . nicotine polacrilex (GNP NICOTINE) 4 MG gum Take 1 each (4 mg total) by mouth as needed for smoking cessation. (Patient not taking: Reported on 09/06/2019) 100 tablet 0   No current facility-administered medications on file prior to visit.    No Known Allergies  Review of Systems ROS Review of Systems - General ROS: positive for difficulty sleeping (able to fall asleep but does not stay asleep). negative for - chills, fatigue, fever, hot flashes, night sweats, weight gain or weight loss Psychological ROS: negative for - anxiety, decreased libido, depression, mood swings, physical abuse or sexual abuse Ophthalmic ROS: negative for - blurry vision, eye pain or loss of vision ENT ROS: negative for - headaches, hearing change, visual changes or vocal changes Allergy and Immunology ROS: negative for - hives, itchy/watery eyes or seasonal allergies Hematological and Lymphatic ROS: negative for - bleeding problems, bruising, swollen lymph nodes or weight loss Endocrine ROS: negative for - galactorrhea, hair pattern changes, hot flashes, malaise/lethargy, mood swings, palpitations, polydipsia/polyuria, skin changes, temperature intolerance or unexpected weight changes Breast ROS: negative for - new or changing breast lumps or nipple discharge Respiratory ROS: negative for - cough or shortness of breath Cardiovascular ROS: negative for - chest pain, irregular heartbeat, palpitations or shortness of breath Gastrointestinal ROS: no abdominal pain, change in bowel habits, or black or bloody stools Genito-Urinary ROS: no dysuria, trouble voiding, or hematuria Musculoskeletal ROS: negative for - joint pain or joint stiffness Neurological ROS: negative for - bowel and bladder control changes Dermatological ROS: negative for rash and skin lesion changes     Objective:   BP (!) 146/79   Pulse 78   Ht 5' 4.5" (1.638 m)   Wt 179 lb 9.6 oz (81.5 kg)   LMP 04/27/2007   BMI 30.35 kg/m  Repeat BP 138/78  CONSTITUTIONAL: Well-developed, well-nourished female in no acute distress. Mild obesity PSYCHIATRIC: Normal mood and affect. Normal behavior. Normal judgment and thought content. Levan: Alert and oriented to person, place, and time. Normal muscle tone coordination. No cranial nerve deficit noted. HENT:  Normocephalic, atraumatic, External right and left ear normal. Oropharynx is clear and moist EYES: Conjunctivae and EOM are normal. Pupils are equal, round, and reactive to light. No scleral icterus.  NECK: Normal range of motion, supple, no masses.  Normal thyroid.  SKIN: Skin is warm and dry. No rash noted. Not diaphoretic. No erythema. No pallor. CARDIOVASCULAR: Normal heart rate noted, regular rhythm, no murmur. RESPIRATORY: Clear to auscultation bilaterally. Effort and breath sounds normal, no problems with respiration noted. BREASTS: Symmetric in size. No masses, skin changes, nipple drainage, or lymphadenopathy. ABDOMEN: Soft, normal bowel sounds, no distention noted.  No tenderness, rebound or guarding. ~3-4 cm non-tender, mobile, soft tissue mass noted in the RLQ just superior to the iliac crest. BLADDER: Normal PELVIC:  Bladder no bladder distension noted  Urethra: normal appearing urethra with no masses, tenderness or lesions  Vulva: normal appearing vulva with no masses, tenderness or lesions  Vagina: mildly atrophic vagina with scant thin discharge, no lesions.   Cervix: normal appearing cervix without discharge or lesions  Uterus: uterus is normal size, shape, consistency and nontender  Adnexa: normal adnexa in size, nontender and no masses  RV: External Exam NormaI, No Rectal Masses and Normal Sphincter tone  MUSCULOSKELETAL: Normal range of motion. No tenderness.  No cyanosis, clubbing, or edema.  2+ distal  pulses. LYMPHATIC: No Axillary, Supraclavicular, or Inguinal Adenopathy.   Labs: Lab Results  Component Value Date   WBC 8.5 07/05/2019   HGB 15.4 07/05/2019   HCT 44.3 07/05/2019   MCV 92 07/05/2019   PLT 133 (L) 07/05/2019    Lab Results  Component Value Date   CREATININE 0.77 07/05/2019   BUN 12 07/05/2019   NA 138  07/05/2019   K 3.9 07/05/2019   CL 100 07/05/2019   CO2 21 07/05/2019    Lab Results  Component Value Date   ALT 13 07/05/2019   AST 15 07/05/2019   ALKPHOS 51 07/05/2019   BILITOT 0.6 07/05/2019    Lab Results  Component Value Date   CHOL 162 07/05/2019   HDL 49 07/05/2019   LDLCALC 86 07/05/2019   TRIG 158 (H) 07/05/2019   CHOLHDL 3.3 07/05/2019    Lab Results  Component Value Date   TSH 10.400 (H) 07/05/2019    Lab Results  Component Value Date   HGBA1C 5.9 (H) 07/05/2019     Assessment:   Well woman exam with routine gynecological exam   Menopause on HRT Recurrent major depressive disorder, in full remission ([place]) Tobacco abuse Hypothyroidism Type II DM  Plan:  - Pap: Up to date, continue routine screening. History of abnormal pap smear that has returned to normal.  - Mammogram: Up to date on screening mammograms. - Stool Guaiac Testing:  Not ordered. Up to date on colonoscopy. Due in 2023 - Labs: None ordered. Had labs performed with PCP at physical 2 months ago. - Routine preventative health maintenance measures emphasized: Exercise/Diet/Weight control, Tobacco Warnings, Alcohol/Substance use risks and Stress Management  - Menopause on HRT, patient desires to stop HRT to decrease amount of medications she is taking. Advised patient to taper down by taking once every other day rather than daily, if she has no problems she can stop the medication completely. Discussed that she can restart the medication if she begins noticing mood changes, hot flashes, or other symptoms after stopping. - RLQ mass, likely a lipoma based on today's  exam. Patient has seen PCP for this, considering ultrasound if it enlarges. - Hypothyroidism and DM managed by PC - Discussed difficulties with sleeping - Return to [place] for annual   I have seen and examined the patient with Blair Heys, [place] PA-S.  I have reviewed the record and concur with patient management and plan.     Rubie Maid, MD [facility]

## 2019-09-06 NOTE — Telephone Encounter (Signed)
Message from Scherrie Gerlach sent at 09/06/2019 12:44 PM EDT  Pt states the dr increased her thyroid medication and now she is hyped up all day, 9:30 am to 9:30 pm.  Wakes up at 1:30 up again. Not sleeping at night.  Wants to know if the increase in synthroid can cause this?

## 2019-09-08 LAB — TSH+FREE T4
Free T4: 2.09 ng/dL — ABNORMAL HIGH (ref 0.82–1.77)
TSH: 2.43 u[IU]/mL (ref 0.450–4.500)

## 2019-09-08 LAB — VITAMIN D 25 HYDROXY (VIT D DEFICIENCY, FRACTURES): Vit D, 25-Hydroxy: 34.4 ng/mL (ref 30.0–100.0)

## 2019-09-10 ENCOUNTER — Telehealth: Payer: Self-pay | Admitting: Physician Assistant

## 2019-09-10 ENCOUNTER — Telehealth: Payer: Self-pay

## 2019-09-10 DIAGNOSIS — G479 Sleep disorder, unspecified: Secondary | ICD-10-CM

## 2019-09-10 NOTE — Telephone Encounter (Signed)
TSH is in normal range.

## 2019-09-10 NOTE — Telephone Encounter (Signed)
Pt would like a return call concerning her recent lab results. Please call her at [phone]

## 2019-09-10 NOTE — Telephone Encounter (Signed)
Copied from [place] [phone]. Topic: General - Other >> Sep 10, 2019  4:17 PM Alanda Slim E wrote: Reason for CRM: Pt asked for a call back to discuss lab results / please advise

## 2019-09-10 NOTE — Telephone Encounter (Signed)
Patient called the [facility] wanted to talk with someone about her abnormal labs. Patient was advised Tawanna Sat is out this week and we would have some one else review them. Carles Collet [name] reviewed labs. Per provider TSH went back in range and is not overcorrected. Called patient no answer LM to call back.

## 2019-09-11 ENCOUNTER — Telehealth: Payer: Self-pay

## 2019-09-11 NOTE — Telephone Encounter (Signed)
Copied from [place] [phone]. Topic: General - Other >> Sep 11, 2019 12:24 PM Marya Landry D wrote: Reason for CRM: Patient has called in to get lab results, Patient ci stating it is ok to leave results on her voicemail if she's unable to answer at that time. Please advise

## 2019-09-11 NOTE — Telephone Encounter (Signed)
Called patient and no answer, left voicemail for patient to return call. If patient returns call okay for [facility] to advise patient of message.

## 2019-09-11 NOTE — Telephone Encounter (Signed)
Patient called, no answer. Left message to return call.

## 2019-09-11 NOTE — Telephone Encounter (Signed)
Please other telephone encounter

## 2019-09-11 NOTE — Telephone Encounter (Signed)
Patient called no answer. Left message to return call. See other telephone encounter for 09/10/19.

## 2019-09-11 NOTE — Telephone Encounter (Signed)
Left voice message as patient stated in another CRM that it was ok to do with results.

## 2019-09-12 NOTE — Telephone Encounter (Signed)
Left message with female at number to have patient return call regarding her lab results.

## 2019-09-14 NOTE — Telephone Encounter (Signed)
Patient was advised about her lab results. She is asking what is causing her not to sleep? That she is only sleeping for 3 hours. She is aware that her Tawanna Sat is not in the office today and that she will be back on Monday.

## 2019-09-17 NOTE — Telephone Encounter (Signed)
There are multiple factors that can affect sleep.   She can schedule an appt to discuss and we can also discuss medications to help sleep.

## 2019-09-17 NOTE — Telephone Encounter (Signed)
LMTCB if patient calls back OK for the PEC to relay message and schedule patient for an appointment.

## 2019-09-18 NOTE — Telephone Encounter (Signed)
Patient advised as directed below. 

## 2019-09-18 NOTE — Telephone Encounter (Signed)
-----   Message from Mar Daring, [name] sent at 09/18/2019 11:22 AM EDT ----- TSH is back in normal range but T4 is slightly elevated. This could mean that your levothyroxine dose may be too high. I would not make changes yet, but would recommend to recheck in 8-12 weeks. If the levothyroxine dose is too high that could be flipping you more into hyperthyroid, which could cause increased anxiety and the difficulty sleeping. Vit D is in normal range.

## 2019-09-19 MED ORDER — TRAZODONE HCL 50 MG PO TABS: 25.0000 mg | ORAL_TABLET | Freq: Every evening | ORAL | 3 refills | Status: DC | PRN

## 2019-09-19 NOTE — Telephone Encounter (Signed)
Left message advising pt.   Thanks,   -Laura  

## 2019-09-19 NOTE — Telephone Encounter (Signed)
Trazodone sent in

## 2019-09-19 NOTE — Telephone Encounter (Signed)
Pt is calling and has an appt on 09-27-2019 and would like to know if jennifer will call her something in to [place]

## 2019-09-19 NOTE — Addendum Note (Signed)
Addended by: Mar Daring on: 09/19/2019 11:25 AM   Modules accepted: Orders

## 2019-09-27 ENCOUNTER — Ambulatory Visit (INDEPENDENT_AMBULATORY_CARE_PROVIDER_SITE_OTHER): Payer: 59 | Admitting: Physician Assistant

## 2019-09-27 ENCOUNTER — Encounter: Payer: Self-pay | Admitting: Physician Assistant

## 2019-09-27 DIAGNOSIS — F5101 Primary insomnia: Secondary | ICD-10-CM | POA: Diagnosis not present

## 2019-09-27 DIAGNOSIS — E034 Atrophy of thyroid (acquired): Secondary | ICD-10-CM | POA: Diagnosis not present

## 2019-09-27 MED ORDER — ZOLPIDEM TARTRATE ER 6.25 MG PO TBCR: 6.2500 mg | EXTENDED_RELEASE_TABLET | Freq: Every evening | ORAL | 0 refills | Status: DC | PRN

## 2019-09-27 NOTE — Progress Notes (Signed)
Virtual telephone visit    Virtual Visit via Telephone Note   This visit type was conducted due to national recommendations for restrictions regarding the COVID-19 Pandemic (e.g. social distancing) in an effort to limit this patient's exposure and mitigate transmission in our community. Due to her co-morbid illnesses, this patient is at least at moderate risk for complications without adequate follow up. This format is felt to be most appropriate for this patient at this time. The patient did not have access to video technology or had technical difficulties with video requiring transitioning to audio format only (telephone). Physical exam was limited to content and character of the telephone converstion.    Patient location: Home Provider location: [facility]   Visit Date: 09/27/2019  Today's healthcare provider: Mar Daring, [name]   Chief Complaint  Patient presents with  . Follow-up    Sleep   Subjective    HPI Follow up for Difficulty Sleeping Patient was having difficulty sleeping and she was prescribed Trazodone.  She reports good compliance with treatment. She feels that condition is Unchanged. She is not having side effects. Reports that she only took it for a week and it wasn't working. Reports that she has a lot of energy. She sleeps about 3-4 hours. Once she wakes up she is not able to go back to sleep. Symptoms started when we increased the levothyroxine dose.  -----------------------------------------------------------------------------------------     Patient Active Problem List   Diagnosis Date Noted  . Recurrent major depressive disorder, in full remission ([place]) 07/05/2019  . Low grade squamous intraepithelial lesion (LGSIL) on cervical Pap smear 06/29/2016  . Pap smear abnormality of cervix/human papillomavirus (HPV) positive 06/29/2016  . Type 2 diabetes mellitus without complications ([place]) [id number]  . Tobacco abuse 05/13/2015  . Polyp of colon  01/14/2015  . 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 Hyperlipidemia, unspecified 01/14/2015  . 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 Cyst of ovary 01/14/2015  . Diaphragmatic hernia without obstruction or gangrene 01/14/2015  . Major depressive disorder, single episode 01/14/2015  . Anxiety 01/13/2015  . BP (high blood pressure) 01/13/2015  . Hypothyroidism 01/13/2015  . Depression 11/05/2014   Past Medical History:  Diagnosis Date  . Anxiety   . Depression   . Diabetes mellitus without complication (HCC)    no meds  . Hyperlipidemia   . Hypertension   . Thyroid disease       Medications: Outpatient Medications Prior to Visit  Medication Sig  . ARIPiprazole (ABILIFY) 5 MG tablet Take 1 tablet (5 mg total) by mouth daily. (Patient taking differently: Take 5 mg by mouth daily. Take 2.5 mg daily.)  . aspirin 81 MG tablet Take 81 mg by mouth every evening.   . desvenlafaxine (PRISTIQ) 100 MG 24 hr tablet Take 1 tablet (100 mg total) by mouth daily.  Marland Kitchen levothyroxine (SYNTHROID) 175 MCG tablet Take 1 tablet (175 mcg total) by mouth daily before breakfast.  . lisinopril-hydrochlorothiazide (ZESTORETIC) 20-25 MG tablet Take 1 tablet by mouth daily.  Marland Kitchen LORazepam (ATIVAN) 1 MG tablet Take 1 tablet (1 mg total) by mouth every 8 (eight) hours as needed for anxiety.  . metoprolol succinate (TOPROL-XL) 25 MG 24 hr tablet Take 1 tablet (25 mg total) by mouth daily.  . naproxen sodium (ANAPROX) 220 MG tablet Take 440 mg by mouth daily as needed (pain).  . norethindrone-ethinyl estradiol (FEMHRT LOW DOSE) 0.5-2.5 MG-MCG tablet TAKE 1 TABLET BY MOUTH ONCE A DAY  . simvastatin (  ZOCOR) 10 MG tablet Take 1 tablet (10 mg total) by mouth at bedtime.  . Vitamin D, Ergocalciferol, (DRISDOL) 1.25 MG (50000 UNIT) CAPS capsule Take 1 capsule (50,000 Units total) by mouth every 7 (seven) days.  . nicotine polacrilex (GNP NICOTINE) 4 MG gum Take 1 each (4 mg total) by mouth as needed for  smoking cessation. (Patient not taking: Reported on 09/06/2019)  . traZODone (DESYREL) 50 MG tablet Take 0.5-1 tablets (25-50 mg total) by mouth at bedtime as needed for sleep. (Patient not taking: Reported on 09/27/2019)  . vitamin B-12 (CYANOCOBALAMIN) 1000 MCG tablet Take 1,000 mcg by mouth daily.   No facility-administered medications prior to visit.    Review of Systems  Constitutional: Negative.   Respiratory: Negative.   Cardiovascular: Negative.   Neurological: Negative.   Psychiatric/Behavioral: Positive for sleep disturbance. The patient is hyperactive.     Last CBC Lab Results  Component Value Date   WBC 8.5 07/05/2019   HGB 15.4 07/05/2019   HCT 44.3 07/05/2019   MCV 92 07/05/2019   MCH 31.8 07/05/2019   RDW 12.1 07/05/2019   PLT 133 (L) [id number]   Last metabolic panel Lab Results  Component Value Date   GLUCOSE 113 (H) 07/05/2019   NA 138 07/05/2019   K 3.9 07/05/2019   CL 100 07/05/2019   CO2 21 07/05/2019   BUN 12 07/05/2019   CREATININE 0.77 07/05/2019   GFRNONAA 84 07/05/2019   GFRAA 96 07/05/2019   CALCIUM 9.4 07/05/2019   PROT 6.6 07/05/2019   ALBUMIN 4.2 07/05/2019   LABGLOB 2.4 07/05/2019   AGRATIO 1.8 07/05/2019   BILITOT 0.6 07/05/2019   ALKPHOS 51 07/05/2019   AST 15 07/05/2019   ALT 13 07/05/2019   ANIONGAP 9 05/10/2016      Objective    LMP 04/27/2007  BP Readings from Last 3 Encounters:  09/06/19 (!) 146/79  07/26/19 136/62  07/05/19 114/73   Wt Readings from Last 3 Encounters:  09/06/19 179 lb 9.6 oz (81.5 kg)  07/05/19 180 lb 3.2 oz (81.7 kg)  07/04/18 183 lb 6.4 oz (83.2 kg)      Assessment & Plan     1. Primary insomnia Worsening. Failed Trazodone. Will try Zolpidem CR 6.25mg  as below. Advised patient to call if not working or if adverse effects occur.  - zolpidem (AMBIEN CR) 6.25 MG CR tablet; Take 1 tablet (6.25 mg total) by mouth at bedtime as needed for sleep.  Dispense: 30 tablet; Refill: 0  2. Hypothyroidism  due to acquired atrophy of thyroid Will recheck labs in 2 weeks. May come in for lab draw at her convenience. Will f/u pending labs.  - TSH + free T4   No follow-ups on file.    I discussed the assessment and treatment plan with the patient. The patient was provided an opportunity to ask questions and all were answered. The patient agreed with the plan and demonstrated an understanding of the instructions.   The patient was advised to call back or seek an in-person evaluation if the symptoms worsen or if the condition fails to improve as anticipated.  I provided 13 minutes of non-face-to-face time during this encounter.  Reynolds Bowl, [name], have reviewed all documentation for this visit. The documentation on 09/27/19 for the exam, diagnosis, procedures, and orders are all accurate and complete.  [place] [facility] [phone] (phone) [phone] (fax)  [place]

## 2019-09-27 NOTE — Patient Instructions (Signed)
Zolpidem extended-release tablets What is this medicine? ZOLPIDEM (zole PI dem) is used to treat insomnia. This medicine helps you to fall asleep and sleep through the night. This medicine may be used for other purposes; ask your health care provider or pharmacist if you have questions. COMMON BRAND NAME(S): Ambien CR What should I tell my health care provider before I take this medicine? They need to know if you have any of these conditions:  depression  history of drug abuse or addiction  if you often drink alcohol  liver disease  lung or breathing disease  myasthenia gravis  sleep apnea  sleep-walking, driving, eating or other activity while not fully awake after taking a sleep medicine  suicidal thoughts, plans, or attempt; a previous suicide attempt by you or a family member  an unusual or allergic reaction to zolpidem, other medicines, foods, dyes, or preservatives  pregnant or trying to get pregnant  breast-feeding How should I use this medicine? Take this medicine by mouth with a glass of water. Follow the directions on the prescription label. Do not crush, split, or chew the tablet before swallowing. It is better to take this medicine on an empty stomach and only when you are ready for bed. Do not take your medicine more often than directed. If you have been taking this medicine for several weeks and suddenly stop taking it, you may get unpleasant withdrawal symptoms. Your doctor or health care professional may want to gradually reduce the dose. Do not stop taking this medicine on your own. Always follow your doctor or health care professional's advice. A special MedGuide will be given to you by the pharmacist with each prescription and refill. Be sure to read this information carefully each time. Talk to your pediatrician regarding the use of this medicine in children. Special care may be needed. Overdosage: If you think you have taken too much of this medicine contact a  poison control center or emergency room at once. NOTE: This medicine is only for you. Do not share this medicine with others. What if I miss a dose? This does not apply. This medicine should only be taken immediately before going to sleep. Do not take double or extra doses. What may interact with this medicine?  alcohol  antihistamines for allergy, cough and cold  certain medicines for anxiety or sleep  certain medicines for depression, like amitriptyline, fluoxetine, sertraline  certain medicines for fungal infections like ketoconazole and itraconazole  certain medicines for seizures like phenobarbital, primidone  ciprofloxacin  dietary supplements for sleep, like valerian or kava kava  general anesthetics like halothane, isoflurane, methoxyflurane, propofol  local anesthetics like lidocaine, pramoxine, tetracaine  medicines that relax muscles for surgery  narcotic medicines for pain  phenothiazines like chlorpromazine, mesoridazine, prochlorperazine, thioridazine  rifampin This list may not describe all possible interactions. Give your health care provider a list of all the medicines, herbs, non-prescription drugs, or dietary supplements you use. Also tell them if you smoke, drink alcohol, or use illegal drugs. Some items may interact with your medicine. What should I watch for while using this medicine? Visit your doctor or health care professional for regular checks on your progress. Keep a regular sleep schedule by going to bed at about the same time each night. Avoid caffeine-containing drinks in the evening hours. When sleep medicines are used every night for more than a few weeks, they may stop working. Talk to your doctor if your insomnia worsens or is not better within 7 to  10 days. After taking this medicine, you may get up out of bed and do an activity that you do not know you are doing. The next morning, you may have no memory of this. Activities include driving a car  ("sleep-driving"), making and eating food, talking on the phone, sexual activity, and sleep-walking. Serious injuries have occurred. Stop the medicine and call your doctor right away if you find out you have done any of these activities. Do not take this medicine if you have used alcohol that evening. Do not take it if you have taken another medicine for sleep. The risk of doing these sleep-related activities is higher. Do not take this medicine unless you are able to stay in bed for a full night (7 to 8 hours) before you must be active again. Do not drive, use machinery, or do anything that needs mental alertness the day after you take this medicine. You may have a decrease in mental alertness the day after use, even if you feel that you are fully awake. Tell your doctor if you will need to perform activities requiring full alertness, such as driving, the next day. Do not stand or sit up quickly, especially if you are an older patient. This reduces the risk of dizzy or fainting spells. If you or your family notice any changes in your moods or behavior, such as new or worsening depression, thoughts of harming yourself, anxiety, other unusual or disturbing thoughts, or memory loss, call your doctor right away. After you stop taking this medicine, you may have trouble falling asleep. This is called rebound insomnia. This problem usually goes away on its own after 1 or 2 nights. What side effects may I notice from receiving this medicine? Side effects that you should report to your doctor or health care professional as soon as possible:  allergic reactions like skin rash, itching or hives, swelling of the face, lips, or tongue  breathing problems  changes in vision  confusion  depressed mood or other changes in moods or emotions  feeling faint or lightheaded, falls  hallucinations  loss of balance or coordination  loss of memory  numbness or tingling of the tongue  restlessness, excitability,  or feelings of anxiety or agitation  signs and symptoms of liver injury like dark yellow or brown urine; general ill feeling or flu-like symptoms; light-colored stools; loss of appetite; nausea; right upper belly pain; unusually weak or tired; yellowing of the eyes or skin  suicidal thoughts  unusual activities while not fully awake like driving, eating, making phone calls, or sexual activity Side effects that usually do not require medical attention (report to your doctor or health care professional if they continue or are bothersome):  dizziness  drowsiness the day after you take this medicine  headache This list may not describe all possible side effects. Call your doctor for medical advice about side effects. You may report side effects to FDA at 1-800-FDA-1088. Where should I keep my medicine? Keep out of the reach of children. This medicine can be abused. Keep your medicine in a safe place to protect it from theft. Do not share this medicine with anyone. Selling or giving away this medicine is dangerous and against the law. This medicine may cause accidental overdose and death if taken by other adults, children, or pets. Mix any unused medicine with a substance like cat litter or coffee grounds. Then throw the medicine away in a sealed container like a sealed bag or a coffee can  with a lid. Do not use the medicine after the expiration date. Store at controlled room temperature between 15 and 25 degrees C (59 and 77 degrees F). NOTE: This sheet is a summary. It may not cover all possible information. If you have questions about this medicine, talk to your doctor, pharmacist, or health care provider.  2020 Elsevier/Gold Standard (2017-10-07 12:52:00)

## 2019-10-13 LAB — TSH+FREE T4
Free T4: 2.56 ng/dL — ABNORMAL HIGH (ref 0.82–1.77)
TSH: 0.332 u[IU]/mL — ABNORMAL LOW (ref 0.450–4.500)

## 2019-10-16 ENCOUNTER — Telehealth: Payer: Self-pay

## 2019-10-16 NOTE — Telephone Encounter (Signed)
Tried calling; no answer.  PEC please advise pt of lab results below.   Thanks,   -Mickel Baas

## 2019-10-16 NOTE — Telephone Encounter (Signed)
-----   Message from Mar Daring, [name] sent at 10/14/2019  8:12 AM EDT ----- Thyroid is slightly overcorrected. This can contribute to the anxiety and issues sleeping that you have been having. I would recommend to decrease the levothyroxine from 130mcg to 159mcg and then recheck your TSH and T4 in 6-8 weeks.

## 2019-10-16 NOTE — Telephone Encounter (Signed)
Patient called and given results as noted below from [facility], [name] noted on 10/14/19, she verbalized understanding. Patient says she has some levothyroxine 150 mcg at home and will start taking those, no new prescription is needed at this time.

## 2019-11-27 ENCOUNTER — Telehealth: Payer: Self-pay

## 2019-11-27 DIAGNOSIS — F419 Anxiety disorder, unspecified: Secondary | ICD-10-CM

## 2019-11-27 MED ORDER — LORAZEPAM 1 MG PO TABS: 1.0000 mg | ORAL_TABLET | Freq: Three times a day (TID) | ORAL | 0 refills | Status: DC | PRN

## 2019-11-27 NOTE — Telephone Encounter (Signed)
Sent in 5 days for her.

## 2019-11-27 NOTE — Telephone Encounter (Signed)
Copied from [place] [id number]. Topic: General - Inquiry >> Nov 27, 2019 10:04 AM Greggory Keen D wrote: Reason for CRM: Pt called saying she accidentally threw away a couple of her pills of Xanax 1 mg.  She needs about 3 or 4 to get her through until she can get a refill.  The pharmacy told her it was too early for her refill right now. She's having a lot of anxiety.  [place]  CB#  [phone]

## 2019-12-21 ENCOUNTER — Other Ambulatory Visit: Payer: Self-pay | Admitting: Physician Assistant

## 2019-12-21 DIAGNOSIS — E559 Vitamin D deficiency, unspecified: Secondary | ICD-10-CM

## 2019-12-21 NOTE — Telephone Encounter (Signed)
Requested medication (s) are due for refill today - no  Requested medication (s) are on the active medication list -yes  Future visit scheduled -no  Last refill: 11/23/19  Notes to clinic: Request for non delegated Rx  Requested Prescriptions  Pending Prescriptions Disp Refills   Vitamin D, Ergocalciferol, (DRISDOL) 1.25 MG (50000 UNIT) CAPS capsule [Pharmacy Med Name: VITAMIN D (ERGOCALCIFEROL) 1.25 MG] 12 capsule 1    Sig: TAKE 1 CAPSULE BY MOUTH EVERY 7 DAYS      Endocrinology:  Vitamins - Vitamin D Supplementation Failed - 12/21/2019 10:49 AM      Failed - 50,000 IU strengths are not delegated      Failed - Phosphate in normal range and within 360 days    No results found for: PHOS        Passed - Ca in normal range and within 360 days    Calcium  Date Value Ref Range Status  07/05/2019 9.4 8.7 - 10.3 mg/dL Final          Passed - Vitamin D in normal range and within 360 days    Vit D, 25-Hydroxy  Date Value Ref Range Status  09/07/2019 34.4 30.0 - 100.0 ng/mL Final    Comment:    Vitamin D deficiency has been defined by the Institute of Medicine and an Endocrine Society practice guideline as a level of serum 25-OH vitamin D less than 20 ng/mL (1,2). The Endocrine Society went on to further define vitamin D insufficiency as a level between 21 and 29 ng/mL (2). 1. IOM (Institute of Medicine). 2010. Dietary reference    intakes for calcium and D. [place]: The    [employer]. 2. Holick MF, Binkley [place], Bischoff-Ferrari HA, et al.    Evaluation, treatment, and prevention of vitamin D    deficiency: an Endocrine Society clinical practice    guideline. JCEM. 2011 Jul; 96(7):1911-30.           Passed - Valid encounter within last 12 months    Recent Outpatient Visits           2 months ago Primary insomnia   [facility] Fenton Malling M, [name]   4 months ago Moderate episode of recurrent major depressive disorder [facility])    [facility] [place], [name], [name]   5 months ago Annual physical exam   [place], [name], [name]   1 year ago Annual physical exam   [facility] Fenton Malling M, [name]   1 year ago Major depressive disorder with single episode, in full remission [facility])   [place], [name], [name]       Future Appointments             In 8 months Rubie Maid, MD [facility]                Requested Prescriptions  Pending Prescriptions Disp Refills   Vitamin D, Ergocalciferol, (DRISDOL) 1.25 MG (50000 UNIT) CAPS capsule [Pharmacy Med Name: VITAMIN D (ERGOCALCIFEROL) 1.25 MG] 12 capsule 1    Sig: TAKE 1 CAPSULE BY MOUTH EVERY 7 DAYS      Endocrinology:  Vitamins - Vitamin D Supplementation Failed - 12/21/2019 10:49 AM      Failed - 50,000 IU strengths are not delegated      Failed - Phosphate in normal range and within 360 days    No results found for: PHOS  Passed - Ca in normal range and within 360 days    Calcium  Date Value Ref Range Status  07/05/2019 9.4 8.7 - 10.3 mg/dL Final          Passed - Vitamin D in normal range and within 360 days    Vit D, 25-Hydroxy  Date Value Ref Range Status  09/07/2019 34.4 30.0 - 100.0 ng/mL Final    Comment:    Vitamin D deficiency has been defined by the Institute of Medicine and an Endocrine Society practice guideline as a level of serum 25-OH vitamin D less than 20 ng/mL (1,2). The Endocrine Society went on to further define vitamin D insufficiency as a level between 21 and 29 ng/mL (2). 1. IOM (Institute of Medicine). 2010. Dietary reference    intakes for calcium and D. [place]: The    [employer]. 2. Holick MF, Binkley [place], Bischoff-Ferrari HA, et al.    Evaluation, treatment, and prevention of vitamin D    deficiency: an Endocrine Society clinical practice    guideline. JCEM. 2011 Jul; 96(7):1911-30.            Passed - Valid encounter within last 12 months    Recent Outpatient Visits           2 months ago Primary insomnia   [place], [name]   4 months ago Moderate episode of recurrent major depressive disorder [facility])   [facility] [place], [name], [name]   5 months ago Annual physical exam   [place], [name], [name]   1 year ago Annual physical exam   [facility] Fenton Malling M, [name]   1 year ago Major depressive disorder with single episode, in full remission [facility])   [place], [name], [name]       Future Appointments             In 8 months Rubie Maid, MD [facility]

## 2019-12-24 ENCOUNTER — Other Ambulatory Visit: Payer: Self-pay | Admitting: Physician Assistant

## 2019-12-24 DIAGNOSIS — F419 Anxiety disorder, unspecified: Secondary | ICD-10-CM

## 2019-12-24 MED ORDER — LORAZEPAM 1 MG PO TABS: 1.0000 mg | ORAL_TABLET | Freq: Three times a day (TID) | ORAL | 4 refills | Status: DC | PRN

## 2019-12-24 NOTE — Telephone Encounter (Signed)
[place] for early fill x 1.  Will monitor to make sure not a recurrent problem

## 2019-12-24 NOTE — Telephone Encounter (Signed)
Requested medication (s) are due for refill today: yes  Requested medication (s) are on the active medication list: yes  Last refill: 11/27/2019  Future visit scheduled:no  Notes to clinic:  this refill cannot be delegated    Requested Prescriptions  Pending Prescriptions Disp Refills   LORazepam (ATIVAN) 1 MG tablet 15 tablet 0    Sig: Take 1 tablet (1 mg total) by mouth every 8 (eight) hours as needed for anxiety.      Not Delegated - Psychiatry:  Anxiolytics/Hypnotics Failed - 12/24/2019 10:09 AM      Failed - This refill cannot be delegated      Failed - Urine Drug Screen completed in last 360 days.      Passed - Valid encounter within last 6 months    Recent Outpatient Visits           2 months ago Primary insomnia   [name], [name]   5 months ago Moderate episode of recurrent major depressive disorder [name], [name], [name]   5 months ago Annual physical exam   [name], [name], [name]   1 year ago Annual physical exam   [name], [name]   1 year ago Major depressive disorder with single episode, in full remission [name], [name], [name]       Future Appointments             In 8 months Rubie Maid, MD [facility]

## 2019-12-24 NOTE — Telephone Encounter (Signed)
done

## 2019-12-24 NOTE — Telephone Encounter (Signed)
Medication Refill - Medication: LORazepam (ATIVAN) 1 MG tablet    Preferred Pharmacy (with phone number or street name):  [place], [place] Phone:  [phone]  Fax:  [phone]       Agent: Please be advised that RX refills may take up to 3 business days. We ask that you follow-up with your pharmacy.

## 2019-12-24 NOTE — Telephone Encounter (Signed)
Regina Pierce calling from [place] call to get a verbal ok that it is ok to refill xanax early. Received a new script for 90 pills. Please advise   [phone]

## 2019-12-26 ENCOUNTER — Telehealth: Payer: Self-pay | Admitting: Physician Assistant

## 2019-12-26 NOTE — Telephone Encounter (Signed)
Pt called in to request a Rx for a sleeping med. Pt says that she has trouble sleeping at night. Pt would like further assistance.   Pharmacy: [place], [place] Phone:  [phone]  Fax:  [phone]

## 2019-12-27 ENCOUNTER — Other Ambulatory Visit: Payer: Self-pay | Admitting: Physician Assistant

## 2019-12-27 DIAGNOSIS — F5101 Primary insomnia: Secondary | ICD-10-CM

## 2019-12-27 NOTE — Telephone Encounter (Signed)
Spoke with patient and advised below, she states that medication didn't seem to work in past but is willingly to try it again. Patient states that she will pick up prescription today from pharmacy, she denied any side effects from medication previously. Patient will call our office back if symptoms do not improve on medication to schedule a virtual appt. KW

## 2019-12-27 NOTE — Telephone Encounter (Signed)
Requested medication (s) are due for refill today: no  Requested medication (s) are on the active medication list: yes   Last refill:  09/27/2019  Future visit scheduled:no  Notes to clinic:  this refill cannot be delegated    Requested Prescriptions  Pending Prescriptions Disp Refills   zolpidem (AMBIEN CR) 6.25 MG CR tablet [Pharmacy Med Name: ZOLPIDEM TARTRATE ER 6.25 MG TAB] 30 tablet     Sig: TAKE 1 TABLET BY MOUTH EVERY NIGHT AT BEDTIME AS NEEDED FOR SLEEP      Not Delegated - Psychiatry:  Anxiolytics/Hypnotics Failed - 12/27/2019 11:14 AM      Failed - This refill cannot be delegated      Failed - Urine Drug Screen completed in last 360 days.      Passed - Valid encounter within last 6 months    Recent Outpatient Visits           3 months ago Primary insomnia   [place], [name]   5 months ago Moderate episode of recurrent major depressive disorder [facility])   [facility] [place], [name], [name]   5 months ago Annual physical exam   [place], [name], [name]   1 year ago Annual physical exam   [facility] F[place] Malling M, [name]   1 year ago Major depressive disorder with single episode, in full remission [facility])   [place], [name], [name]       Future Appointments             In 8 months Rubie Maid, MD [facility]

## 2019-12-27 NOTE — Telephone Encounter (Signed)
Left message to call back. KW °

## 2019-12-27 NOTE — Telephone Encounter (Signed)
At last visit in June we started Zolpidem. Did that work? Does she just need refills. If that hasn't worked she may require a virtual visit.

## 2020-01-03 ENCOUNTER — Other Ambulatory Visit: Payer: Self-pay | Admitting: Physician Assistant

## 2020-01-03 DIAGNOSIS — E034 Atrophy of thyroid (acquired): Secondary | ICD-10-CM

## 2020-01-03 NOTE — Telephone Encounter (Signed)
Call to patient- advised to come to lab for lab recheck- will need orders. Patient given #[phone] courtesy RF. Note sent to office for lab orders

## 2020-01-18 ENCOUNTER — Ambulatory Visit: Payer: 59 | Admitting: Physician Assistant

## 2020-01-18 NOTE — Progress Notes (Deleted)
     Established patient visit   Patient: Regina Pierce   DOB: 05/24/1955   64 y.o. Female  MRN: [id number] Visit Date: 01/18/2020  Today's healthcare provider: Mar Daring, [name]   No chief complaint on file.  Subjective    HPI  ***  {Show patient history (optional):23778::" "}   Medications: Outpatient Medications Prior to Visit  Medication Sig  . ARIPiprazole (ABILIFY) 5 MG tablet Take 1 tablet (5 mg total) by mouth daily. (Patient taking differently: Take 5 mg by mouth daily. Take 2.5 mg daily.)  . aspirin 81 MG tablet Take 81 mg by mouth every evening.   . desvenlafaxine (PRISTIQ) 100 MG 24 hr tablet Take 1 tablet (100 mg total) by mouth daily.  Marland Kitchen levothyroxine (SYNTHROID) 175 MCG tablet TAKE 1 TAB BY MOUTH ONCE DAILY. TAKE ON AN EMPTY STOMACH WITH A GLASS OF WATER ATLEAST 30-60 MINUTES BEFORE BREAKFAST  . lisinopril-hydrochlorothiazide (ZESTORETIC) 20-25 MG tablet Take 1 tablet by mouth daily.  Marland Kitchen LORazepam (ATIVAN) 1 MG tablet Take 1 tablet (1 mg total) by mouth every 8 (eight) hours as needed for anxiety.  . metoprolol succinate (TOPROL-XL) 25 MG 24 hr tablet Take 1 tablet (25 mg total) by mouth daily.  . naproxen sodium (ANAPROX) 220 MG tablet Take 440 mg by mouth daily as needed (pain).  . norethindrone-ethinyl estradiol (FEMHRT LOW DOSE) 0.5-2.5 MG-MCG tablet TAKE 1 TABLET BY MOUTH ONCE A DAY  . simvastatin (ZOCOR) 10 MG tablet Take 1 tablet (10 mg total) by mouth at bedtime.  . vitamin B-12 (CYANOCOBALAMIN) 1000 MCG tablet Take 1,000 mcg by mouth daily.  . Vitamin D, Ergocalciferol, (DRISDOL) 1.25 MG (50000 UNIT) CAPS capsule TAKE 1 CAPSULE BY MOUTH EVERY 7 DAYS  . zolpidem (AMBIEN CR) 6.25 MG CR tablet TAKE 1 TABLET BY MOUTH EVERY NIGHT AT BEDTIME AS NEEDED FOR SLEEP   No facility-administered medications prior to visit.    Review of Systems  {Heme  Chem  Endocrine  Serology  Results Review (optional):23779::" "}  Objective    LMP 04/27/2007  {Show  previous vital signs (optional):23777::" "}  Physical Exam  ***  No results found for any visits on 01/18/20.  Assessment & Plan     ***  No follow-ups on file.      {provider attestation***:1}   Rubye Beach  [facility] [phone] (phone) [phone] (fax)  [place]

## 2020-01-21 ENCOUNTER — Other Ambulatory Visit: Payer: Self-pay | Admitting: Physician Assistant

## 2020-01-21 DIAGNOSIS — F5101 Primary insomnia: Secondary | ICD-10-CM

## 2020-01-21 DIAGNOSIS — F419 Anxiety disorder, unspecified: Secondary | ICD-10-CM

## 2020-01-21 MED ORDER — ZOLPIDEM TARTRATE ER 6.25 MG PO TBCR: 6.2500 mg | EXTENDED_RELEASE_TABLET | Freq: Every evening | ORAL | 5 refills | Status: DC | PRN

## 2020-01-21 MED ORDER — LORAZEPAM 1 MG PO TABS: 1.0000 mg | ORAL_TABLET | Freq: Three times a day (TID) | ORAL | 5 refills | Status: DC | PRN

## 2020-01-21 NOTE — Telephone Encounter (Signed)
Ptwas advised by her pharmacy to call and see if Anderson [place] will Ok the early release of her LORazepam (ATIVAN) 1 MG tablet Because she is going out of town tomorrow / Pt also requested a refill for zolpidem (AMBIEN CR) 6.25 MG CR tablet  /please call pt if sent and advise

## 2020-02-01 ENCOUNTER — Other Ambulatory Visit: Payer: Self-pay | Admitting: Physician Assistant

## 2020-02-01 DIAGNOSIS — E034 Atrophy of thyroid (acquired): Secondary | ICD-10-CM

## 2020-02-01 NOTE — Telephone Encounter (Signed)
Requested Prescriptions  Pending Prescriptions Disp Refills   levothyroxine (SYNTHROID) 175 MCG tablet [Pharmacy Med Name: LEVOTHYROXINE SODIUM 175 MCG TAB] 30 tablet 0    Sig: TAKE 1 TAB BY MOUTH ONCE DAILY. TAKE ON AN EMPTY STOMACH WITH A GLASS OF WATER ATLEAST 30-60 MINUTES BEFORE BREAKFAST     Endocrinology:  Hypothyroid Agents Failed - 02/01/2020  9:44 AM      Failed - TSH needs to be rechecked within 3 months after an abnormal result. Refill until TSH is due.      Failed - TSH in normal range and within 360 days    TSH  Date Value Ref Range Status  10/12/2019 0.332 (L) 0.450 - 4.500 uIU/mL Final         Passed - Valid encounter within last 12 months    Recent Outpatient Visits          4 months ago Primary insomnia   [place], [name]   6 months ago Moderate episode of recurrent major depressive disorder [facility])   [facility] [place], [name], [name]   7 months ago Annual physical exam   [place], [name], [name]   1 year ago Annual physical exam   [place], [place], [name]   1 year ago Major depressive disorder with single episode, in full remission [facility])   [place], [name], [name]      Future Appointments            In 7 months Rubie Maid, MD [facility]

## 2020-03-05 ENCOUNTER — Other Ambulatory Visit: Payer: Self-pay | Admitting: Physician Assistant

## 2020-03-05 DIAGNOSIS — E034 Atrophy of thyroid (acquired): Secondary | ICD-10-CM

## 2020-03-24 ENCOUNTER — Other Ambulatory Visit: Payer: Self-pay

## 2020-03-24 ENCOUNTER — Ambulatory Visit (INDEPENDENT_AMBULATORY_CARE_PROVIDER_SITE_OTHER): Payer: 59 | Admitting: Physician Assistant

## 2020-03-24 ENCOUNTER — Encounter: Payer: Self-pay | Admitting: Physician Assistant

## 2020-03-24 VITALS — BP 139/70 | HR 81 | Temp 98.7°F | Resp 16 | Wt 209.7 lb

## 2020-03-24 DIAGNOSIS — E034 Atrophy of thyroid (acquired): Secondary | ICD-10-CM

## 2020-03-24 DIAGNOSIS — F5101 Primary insomnia: Secondary | ICD-10-CM | POA: Diagnosis not present

## 2020-03-24 DIAGNOSIS — E1122 Type 2 diabetes mellitus with diabetic chronic kidney disease: Secondary | ICD-10-CM

## 2020-03-24 DIAGNOSIS — N182 Chronic kidney disease, stage 2 (mild): Secondary | ICD-10-CM | POA: Diagnosis not present

## 2020-03-24 MED ORDER — ZOLPIDEM TARTRATE ER 12.5 MG PO TBCR: 12.5000 mg | EXTENDED_RELEASE_TABLET | Freq: Every evening | ORAL | 0 refills | Status: DC | PRN

## 2020-03-24 NOTE — Progress Notes (Signed)
Established patient visit   Patient: Regina Pierce   DOB: 1955/05/13   64 y.o. Female  MRN: [id number] Visit Date: 03/24/2020  Today's healthcare provider: Mar Daring, [name]   Chief Complaint  Patient presents with  . Medication Refill   Subjective    HPI  Hypothyroid, follow-up  Lab Results  Component Value Date   TSH 0.332 (L) 10/12/2019   TSH 2.430 09/07/2019   TSH 10.400 (H) 07/05/2019   FREET4 2.56 (H) 10/12/2019   FREET4 2.09 (H) 09/07/2019   Wt Readings from Last 3 Encounters:  03/24/20 209 lb 11.2 oz (95.1 kg)  09/06/19 179 lb 9.6 oz (81.5 kg)  07/05/19 180 lb 3.2 oz (81.7 kg)    She was last seen for hypothyroid 5 months ago.  Management since that visit includes will recheck labs in 2 weeks She reports excellent compliance with treatment. She is not having side effects.   Symptoms: No change in energy level No constipation  No diarrhea No heat / cold intolerance  No nervousness No palpitations  No weight changes    ----------------------------------------------------------------------------------------- Insomnia: Treated with Ambien back in June reports that is not working.  Patient declined influenza vaccine and Eye exam past due.  Patient Active Problem List   Diagnosis Date Noted  . Primary insomnia 03/24/2020  . Recurrent major depressive disorder, in full remission ([place]) 07/05/2019  . Low grade squamous intraepithelial lesion (LGSIL) on cervical Pap smear 06/29/2016  . Pap smear abnormality of cervix/human papillomavirus (HPV) positive 06/29/2016  . Type 2 diabetes mellitus without complications ([place]) 88/50/2774  . Tobacco abuse 05/13/2015  . Polyp of colon 01/14/2015  . 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 Hyperlipidemia, unspecified 01/14/2015  . 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 Cyst of ovary 01/14/2015  . Diaphragmatic hernia without obstruction or gangrene 01/14/2015  . Anxiety 01/13/2015  . BP  (high blood pressure) 01/13/2015  . Hypothyroidism 01/13/2015   Past Medical History:  Diagnosis Date  . Anxiety   . Depression   . Diabetes mellitus without complication (HCC)    no meds  . Hyperlipidemia   . Hypertension   . Thyroid disease        Medications: Outpatient Medications Prior to Visit  Medication Sig  . ARIPiprazole (ABILIFY) 5 MG tablet Take 1 tablet (5 mg total) by mouth daily. (Patient taking differently: Take 5 mg by mouth daily. Take 2.5 mg daily.)  . aspirin 81 MG tablet Take 81 mg by mouth every evening.   . desvenlafaxine (PRISTIQ) 100 MG 24 hr tablet Take 1 tablet (100 mg total) by mouth daily.  Marland Kitchen levothyroxine (SYNTHROID) 175 MCG tablet TAKE 1 TAB BY MOUTH ONCE DAILY. TAKE ON AN EMPTY STOMACH WITH A GLASS OF WATER ATLEAST 30-60 MINUTES BEFORE BREAKFAST  . lisinopril-hydrochlorothiazide (ZESTORETIC) 20-25 MG tablet Take 1 tablet by mouth daily.  Marland Kitchen LORazepam (ATIVAN) 1 MG tablet Take 1 tablet (1 mg total) by mouth every 8 (eight) hours as needed for anxiety.  . metoprolol succinate (TOPROL-XL) 25 MG 24 hr tablet Take 1 tablet (25 mg total) by mouth daily.  . naproxen sodium (ANAPROX) 220 MG tablet Take 440 mg by mouth daily as needed (pain).  . norethindrone-ethinyl estradiol (FEMHRT LOW DOSE) 0.5-2.5 MG-MCG tablet TAKE 1 TABLET BY MOUTH ONCE A DAY  . simvastatin (ZOCOR) 10 MG tablet Take 1 tablet (10 mg total) by mouth at bedtime.  . vitamin B-12 (CYANOCOBALAMIN) 1000 MCG tablet Take 1,000 mcg  by mouth daily.  . Vitamin D, Ergocalciferol, (DRISDOL) 1.25 MG (50000 UNIT) CAPS capsule TAKE 1 CAPSULE BY MOUTH EVERY 7 DAYS  . zolpidem (AMBIEN CR) 6.25 MG CR tablet Take 1 tablet (6.25 mg total) by mouth at bedtime as needed. for sleep   No facility-administered medications prior to visit.    Review of Systems  Constitutional: Negative.   Respiratory: Negative.   Cardiovascular: Negative.   Gastrointestinal: Negative.   Psychiatric/Behavioral: Positive for  dysphoric mood and sleep disturbance. The patient is nervous/anxious.     Last CBC Lab Results  Component Value Date   WBC 8.5 07/05/2019   HGB 15.4 07/05/2019   HCT 44.3 07/05/2019   MCV 92 07/05/2019   MCH 31.8 07/05/2019   RDW 12.1 07/05/2019   PLT 133 (L) 17/79/3903   Last metabolic panel Lab Results  Component Value Date   GLUCOSE 113 (H) 07/05/2019   NA 138 07/05/2019   K 3.9 07/05/2019   CL 100 07/05/2019   CO2 21 07/05/2019   BUN 12 07/05/2019   CREATININE 0.77 07/05/2019   GFRNONAA 84 07/05/2019   GFRAA 96 07/05/2019   CALCIUM 9.4 07/05/2019   PROT 6.6 07/05/2019   ALBUMIN 4.2 07/05/2019   LABGLOB 2.4 07/05/2019   AGRATIO 1.8 07/05/2019   BILITOT 0.6 07/05/2019   ALKPHOS 51 07/05/2019   AST 15 07/05/2019   ALT 13 07/05/2019   ANIONGAP 9 05/10/2016      Objective    BP 139/70 (BP Location: Left Arm, Patient Position: Sitting, Cuff Size: Large)   Pulse 81   Temp 98.7 F (37.1 C) (Oral)   Resp 16   Wt 209 lb 11.2 oz (95.1 kg)   LMP 04/27/2007   BMI 35.44 kg/m  BP Readings from Last 3 Encounters:  03/24/20 139/70  09/06/19 (!) 146/79  07/26/19 136/62   Wt Readings from Last 3 Encounters:  03/24/20 209 lb 11.2 oz (95.1 kg)  09/06/19 179 lb 9.6 oz (81.5 kg)  07/05/19 180 lb 3.2 oz (81.7 kg)      Physical Exam Vitals reviewed.  Constitutional:      General: She is not in acute distress.    Appearance: Normal appearance. She is well-developed and well-groomed. She is obese. She is not ill-appearing or diaphoretic.  HENT:     Head: Normocephalic and atraumatic.  Cardiovascular:     Rate and Rhythm: Normal rate and regular rhythm.     Pulses: Normal pulses.     Heart sounds: Normal heart sounds. No murmur heard.  No friction rub. No gallop.   Pulmonary:     Effort: Pulmonary effort is normal. No respiratory distress.     Breath sounds: Normal breath sounds. No wheezing or rales.  Musculoskeletal:     Cervical back: Normal range of motion and  neck supple.  Neurological:     Mental Status: She is alert.  Psychiatric:        Mood and Affect: Mood normal.        Behavior: Behavior is cooperative.        Thought Content: Thought content normal.       No results found for any visits on 03/24/20.  Assessment & Plan     1. Hypothyroidism due to acquired atrophy of thyroid Will check labs as below and f/u pending results. Will adjust levothyroxine if needed. - TSH + free T4  2. Type 2 diabetes mellitus with stage 2 chronic kidney disease, without long-term current use of insulin (  [place]) Stable. Diet controlled. Will check labs as below and f/u pending results. - Basic Metabolic Panel (BMET) - HgB A1c  3. Primary insomnia Worsening. Increase Ambien as below. F/U in 4 weeks.  - zolpidem (AMBIEN CR) 12.5 MG CR tablet; Take 1 tablet (12.5 mg total) by mouth at bedtime as needed for sleep.  Dispense: 30 tablet; Refill: 0   No follow-ups on file.      Reynolds Bowl, [name], have reviewed all documentation for this visit. The documentation on 03/25/20 for the exam, diagnosis, procedures, and orders are all accurate and complete.   Rubye Beach  [facility] [phone] (phone) [phone] (fax)  [place]

## 2020-03-25 ENCOUNTER — Encounter: Payer: Self-pay | Admitting: Physician Assistant

## 2020-03-29 LAB — BASIC METABOLIC PANEL
BUN/Creatinine Ratio: 19 (ref 12–28)
BUN: 14 mg/dL (ref 8–27)
CO2: 25 mmol/L (ref 20–29)
Calcium: 9.7 mg/dL (ref 8.7–10.3)
Chloride: 101 mmol/L (ref 96–106)
Creatinine, Ser: 0.72 mg/dL (ref 0.57–1.00)
GFR calc Af Amer: 104 mL/min/{1.73_m2} (ref >59)
GFR calc non Af Amer: 90 mL/min/{1.73_m2} (ref >59)
Glucose: 118 mg/dL — ABNORMAL HIGH (ref 65–99)
Potassium: 4.1 mmol/L (ref 3.5–5.2)
Sodium: 141 mmol/L (ref 134–144)

## 2020-03-29 LAB — TSH+FREE T4
Free T4: 2.17 ng/dL — ABNORMAL HIGH (ref 0.82–1.77)
TSH: 0.618 u[IU]/mL (ref 0.450–4.500)

## 2020-03-29 LAB — HEMOGLOBIN A1C
Est. average glucose Bld gHb Est-mCnc: 140 mg/dL
Hgb A1c MFr Bld: 6.5 % — ABNORMAL HIGH (ref 4.8–5.6)

## 2020-03-31 ENCOUNTER — Telehealth: Payer: Self-pay

## 2020-03-31 MED ORDER — LEVOTHYROXINE SODIUM 175 MCG PO TABS: 175.0000 ug | ORAL_TABLET | Freq: Every day | ORAL | 3 refills | Status: DC

## 2020-03-31 NOTE — Telephone Encounter (Signed)
Copied from [place] [id number]. Topic: Quick Communication - Lab Results (Clinic Use ONLY) >> Mar 31, 2020  2:25 PM Scherrie Gerlach wrote: Pt calling for lab results.  Sonia Baller has resulted but pt does not have mychart

## 2020-03-31 NOTE — Addendum Note (Signed)
Addended by: Mar Daring on: 03/31/2020 12:56 PM   Modules accepted: Orders

## 2020-03-31 NOTE — Telephone Encounter (Signed)
Patient advised.

## 2020-04-04 ENCOUNTER — Telehealth: Payer: Self-pay

## 2020-04-04 NOTE — Telephone Encounter (Signed)
That is highest dose.   Take with melatonin to see if that helps.

## 2020-04-04 NOTE — Telephone Encounter (Signed)
Copied from [place] [id number]. Topic: General - Other >> Apr 04, 2020 11:56 AM Hinda Lenis D wrote: PT requesting a call back from a nurse / she needs a stronger doze  / zolpidem (AMBIEN CR) 12.5 MG CR tablet [[id number]] /please advise

## 2020-04-04 NOTE — Telephone Encounter (Signed)
Patient advised as directed below. 

## 2020-04-09 ENCOUNTER — Other Ambulatory Visit: Payer: Self-pay | Admitting: Physician Assistant

## 2020-04-09 DIAGNOSIS — F5101 Primary insomnia: Secondary | ICD-10-CM

## 2020-04-09 DIAGNOSIS — F419 Anxiety disorder, unspecified: Secondary | ICD-10-CM

## 2020-04-09 NOTE — Telephone Encounter (Signed)
Requested medication (s) are due for refill today - no  Requested medication (s) are on the active medication list -yes  Future visit scheduled -yes  Last refill: 03/24/20  Notes to clinic: Duplicate request- non delegated rx  Requested Prescriptions  Pending Prescriptions Disp Refills   zolpidem (AMBIEN CR) 12.5 MG CR tablet [Pharmacy Med Name: ZOLPIDEM TARTRATE ER 12.5 MG TAB] 30 tablet     Sig: TAKE 1 TABLET BY MOUTH EVERY NIGHT AT BEDTIME AS NEEDED FOR SLEEP.      Not Delegated - Psychiatry:  Anxiolytics/Hypnotics Failed - 04/09/2020  1:33 PM      Failed - This refill cannot be delegated      Failed - Urine Drug Screen completed in last 360 days      Passed - Valid encounter within last 6 months    Recent Outpatient Visits           2 weeks ago Hypothyroidism due to acquired atrophy of thyroid   [place], [name], [name]   6 months ago Primary insomnia   [facility] Fenton Malling M, [name]   8 months ago Moderate episode of recurrent major depressive disorder [facility])   [facility] [place], [name], [name]   9 months ago Annual physical exam   [place], [name], [name]   1 year ago Annual physical exam   [place], [name], [name]       Future Appointments             In 2 months Marlyn Corporal, [name], [name] [employer], [place]   In 44 months Rubie Maid, MD [facility]                 Requested Prescriptions  Pending Prescriptions Disp Refills   zolpidem (AMBIEN CR) 12.5 MG CR tablet [Pharmacy Med Name: ZOLPIDEM TARTRATE ER 12.5 MG TAB] 30 tablet     Sig: TAKE 1 TABLET BY MOUTH EVERY NIGHT AT BEDTIME AS NEEDED FOR SLEEP.      Not Delegated - Psychiatry:  Anxiolytics/Hypnotics Failed - 04/09/2020  1:33 PM      Failed - This refill cannot be delegated      Failed - Urine Drug Screen completed in last 360 days       Passed - Valid encounter within last 6 months    Recent Outpatient Visits           2 weeks ago Hypothyroidism due to acquired atrophy of thyroid   [place], [name], [name]   6 months ago Primary insomnia   [facility] Fenton Malling M, [name]   8 months ago Moderate episode of recurrent major depressive disorder [facility])   [facility] [place], [name], [name]   9 months ago Annual physical exam   [facility] [place], [name], [name]   1 year ago Annual physical exam   [place], [name], [name]       Future Appointments             In 2 months [place], [name], [name] [employer], [place]   In 5 months Rubie Maid, MD [facility]

## 2020-04-09 NOTE — Telephone Encounter (Signed)
Requested medication (s) are due for refill today: no  Requested medication (s) are on the active medication list: yes   Last refill:  03/24/2020  Future visit scheduled: yes   Notes to clinic:  this refill cannot be delegated   Requested Prescriptions  Pending Prescriptions Disp Refills   zolpidem (AMBIEN CR) 12.5 MG CR tablet 30 tablet 0    Sig: Take 1 tablet (12.5 mg total) by mouth at bedtime as needed for sleep.      There is no refill protocol information for this order      LORazepam (ATIVAN) 1 MG tablet 90 tablet 5    Sig: Take 1 tablet (1 mg total) by mouth every 8 (eight) hours as needed for anxiety.      There is no refill protocol information for this order

## 2020-04-09 NOTE — Telephone Encounter (Signed)
Medication Refill - Medication: zolpidem (AMBIEN CR) 12.5 MG CR tablet , LORazepam (ATIVAN) 1 MG tablet    Has the patient contacted their pharmacy? yes (Agent: If no, request that the patient contact the pharmacy for the refill.) (Agent: If yes, when and what did the pharmacy advise?)Contact PCP  Preferred Pharmacy (with phone number or street name):  [place], [place] Phone:  [phone]  Fax:  [phone]       Agent: Please be advised that RX refills may take up to 3 business days. We ask that you follow-up with your pharmacy.

## 2020-05-05 ENCOUNTER — Other Ambulatory Visit: Payer: Self-pay | Admitting: Physician Assistant

## 2020-05-05 DIAGNOSIS — F5101 Primary insomnia: Secondary | ICD-10-CM

## 2020-05-05 NOTE — Telephone Encounter (Signed)
Requested medication (s) are due for refill today: yes  Requested medication (s) are on the active medication list: yes  Last refill:  04/21/2020  Future visit scheduled: yes  Notes to clinic:  this refill cannot be delegated    Requested Prescriptions  Pending Prescriptions Disp Refills   zolpidem (AMBIEN CR) 12.5 MG CR tablet [Pharmacy Med Name: ZOLPIDEM TARTRATE ER 12.5 MG TAB] 30 tablet     Sig: TAKE 1 TABLET BY MOUTH EVERY NIGHT AT BEDTIME AS NEEDED FOR SLEEP.      Not Delegated - Psychiatry:  Anxiolytics/Hypnotics Failed - 05/05/2020 11:14 AM      Failed - This refill cannot be delegated      Failed - Urine Drug Screen completed in last 360 days      Passed - Valid encounter within last 6 months    Recent Outpatient Visits           1 month ago Hypothyroidism due to acquired atrophy of thyroid   [place], [name], [name]   7 months ago Primary insomnia   [facility] Fenton Malling M, [name]   9 months ago Moderate episode of recurrent major depressive disorder [facility])   [facility] [place], [name], [name]   10 months ago Annual physical exam   [facility] [place], [name], [name]   1 year ago Annual physical exam   [place], [name], [name]       Future Appointments             In 1 month [place], [name], [name] [employer], [place]   In 4 months Rubie Maid, MD [facility]

## 2020-05-28 IMAGING — US US PELVIS COMPLETE
1 series · 14 of 25 positions shown · non-contrast
Comparison: CT 08/28/2009

CLINICAL DATA: Abnormal estrogen elevation in postmenopausal
patient.



[Series 1: us pelvis complete · 0.20mm/px · 14 of 82 slices shown]
[im 1/82]
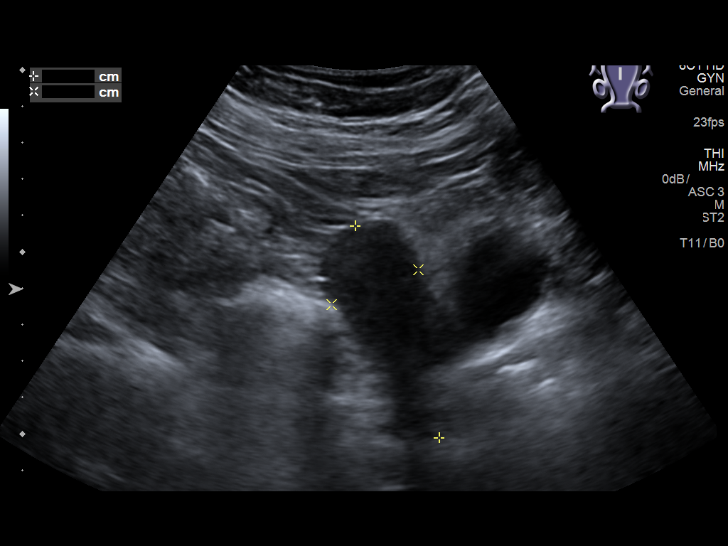
[im 7/82]
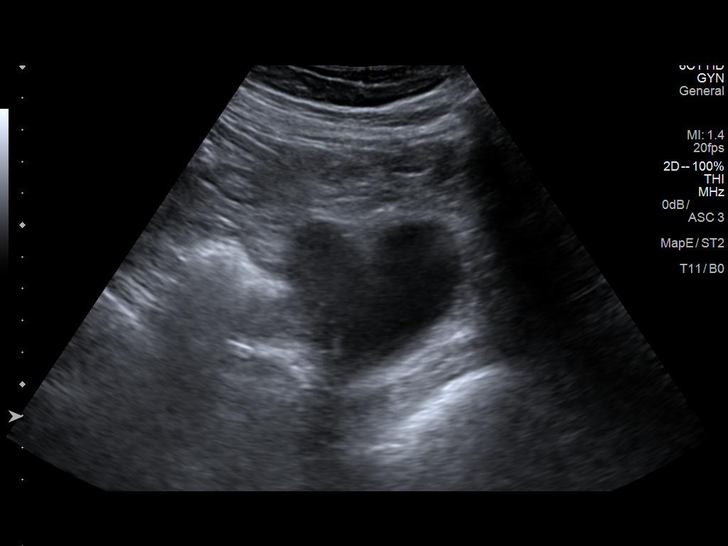
[im 14/82]
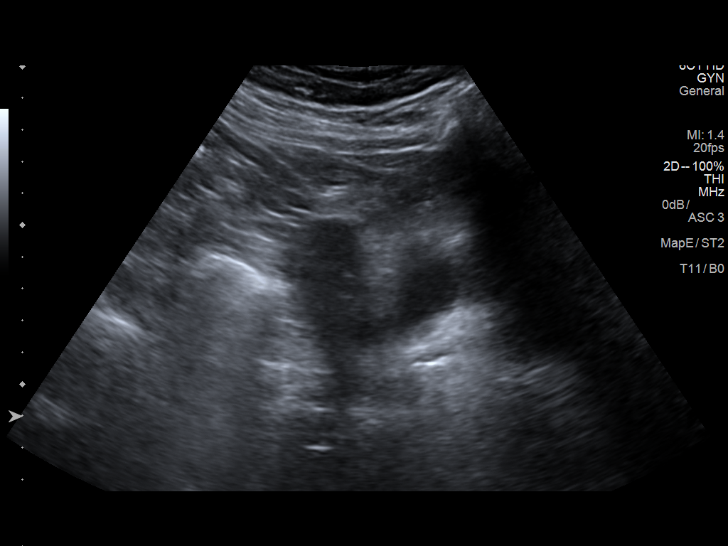
[im 21/82]
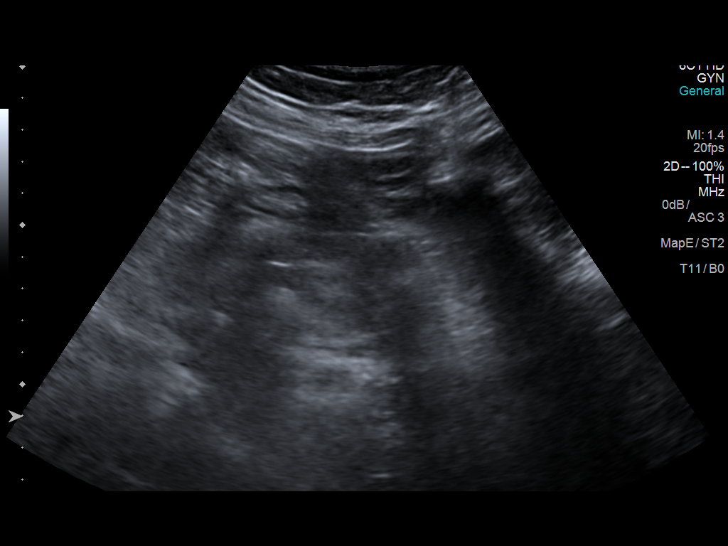
[im 28/82]
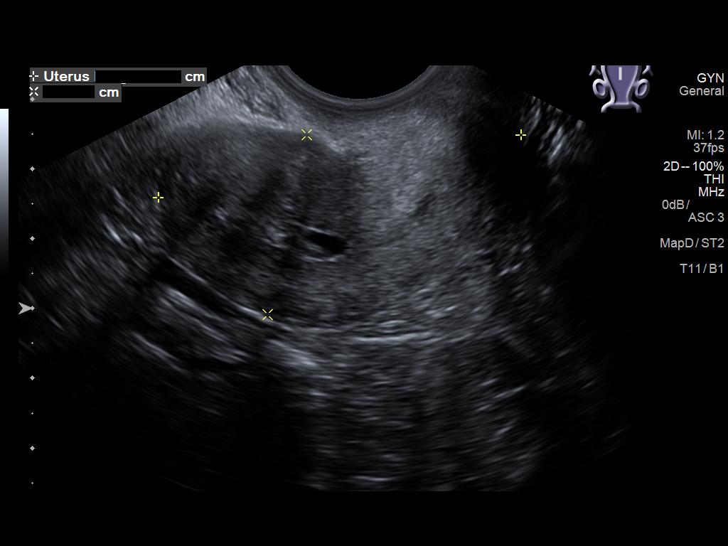
[im 31/82]
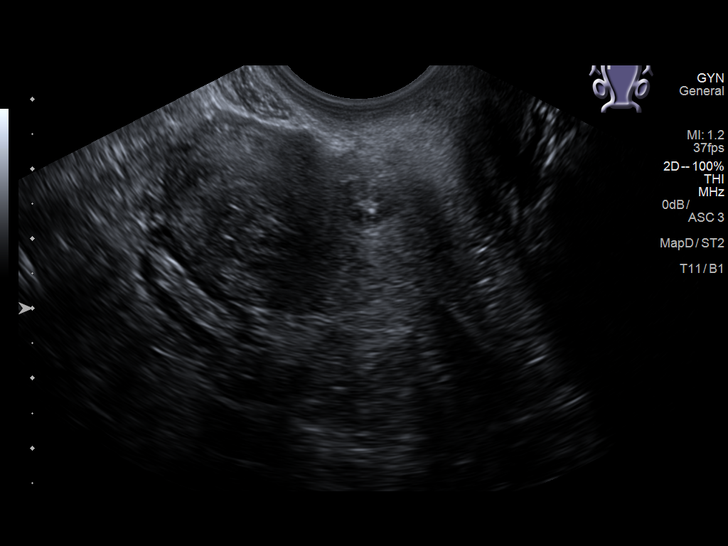
[im 38/82]
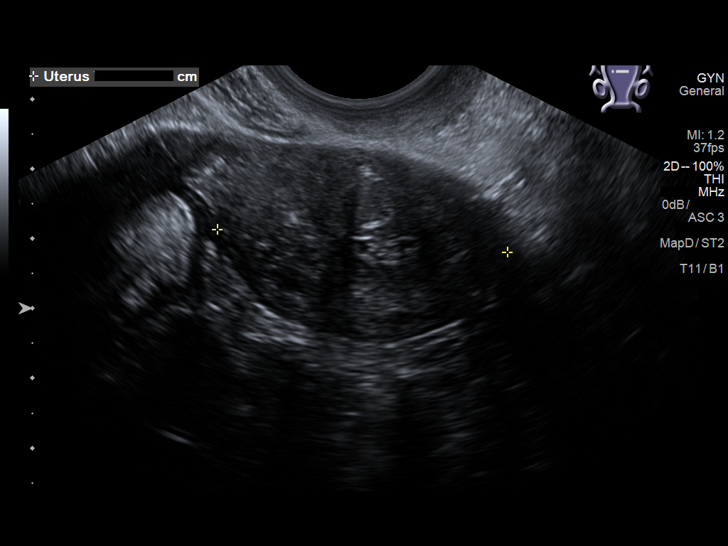
[im 44/82]
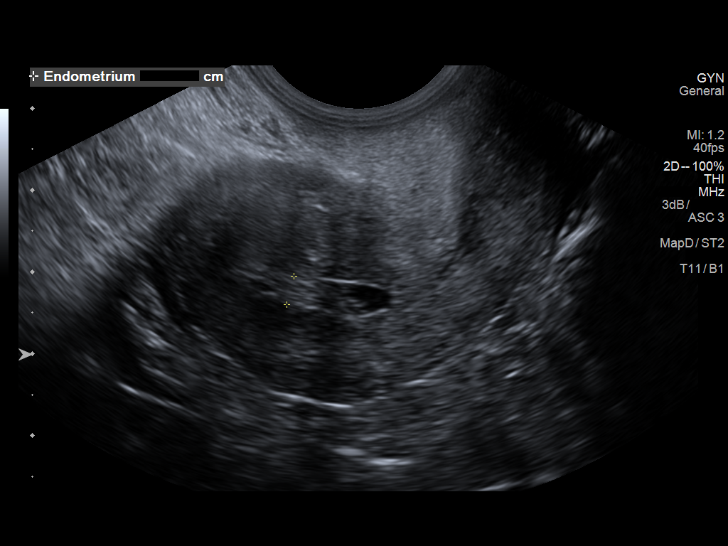
[im 51/82]
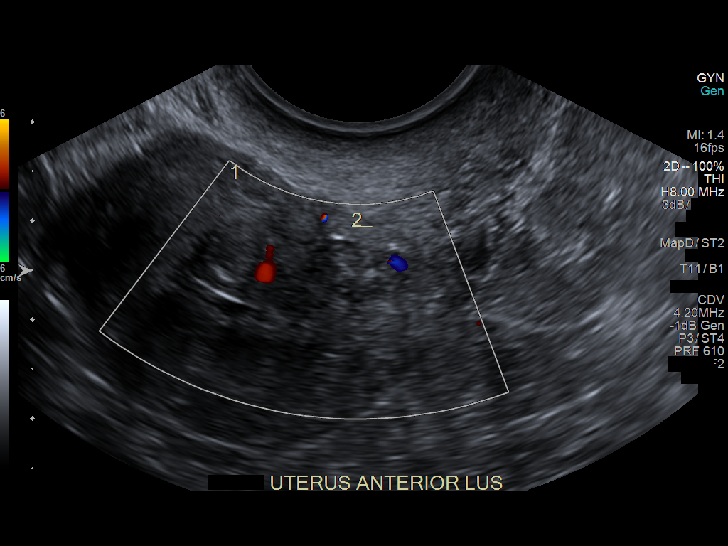
[im 55/82]
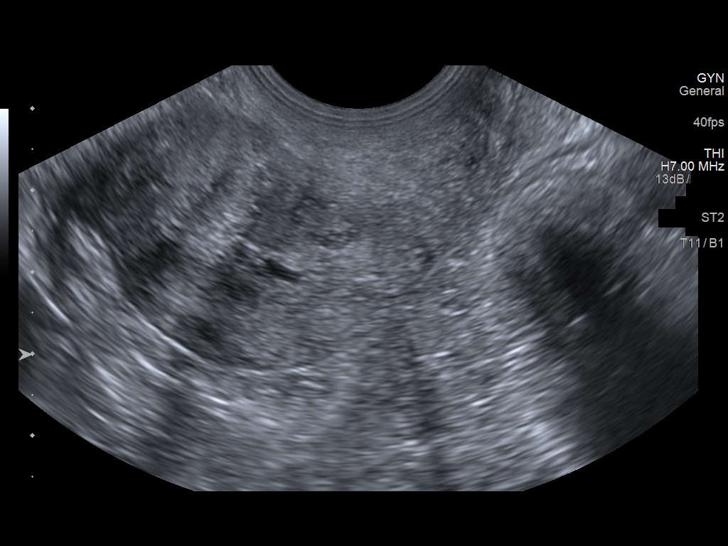
[im 61/82]
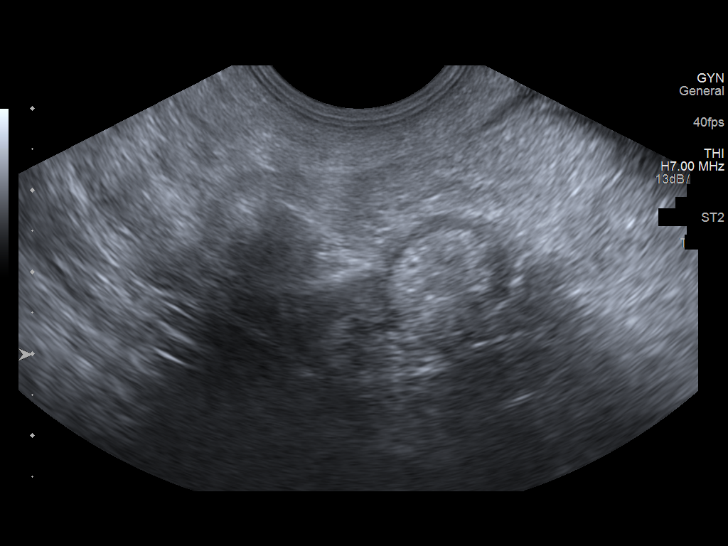
[im 68/82]
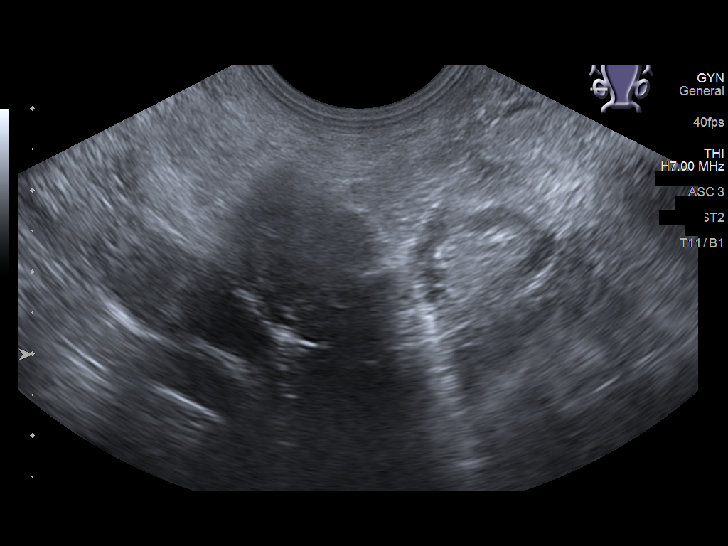
[im 75/82]
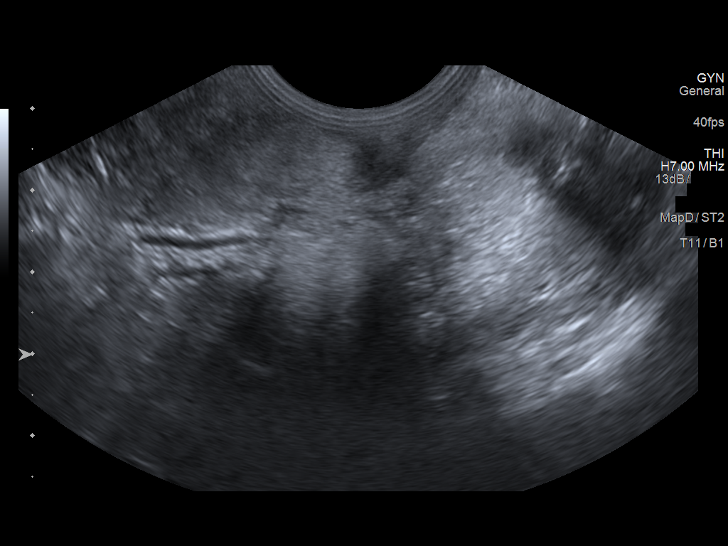
[im 82/82]
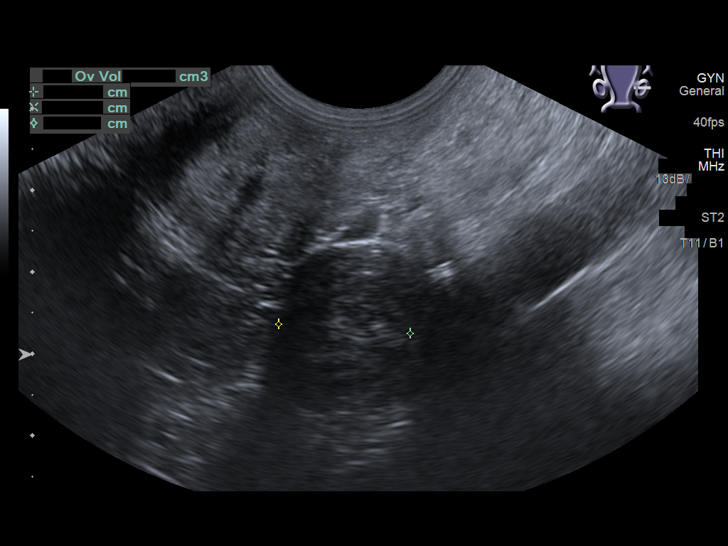

[14 of 25 positions shown; findings below may reference images not displayed]

FINDINGS: Uterus

Measurements: 5.3 x 2.6 x 4.2 cm. 2 small anterior subserosal
fibroids measuring 7 mm maximally each.

Endometrium

Thickness: 4 mm in thickness.  No focal abnormality visualized.

Right ovary

Measurements: 2.3 x 1.8 x 1.6 cm. Normal appearance/no adnexal mass.

Left ovary

Measurements: 2.3 x 1.5 x 1.6 cm. Normal appearance/no adnexal mass.

Other findings

No abnormal free fluid.
IMPRESSION: Small subserosal anterior fundal fibroids measuring 7 mm maximally
each.

Otherwise unremarkable study.

## 2020-06-24 ENCOUNTER — Other Ambulatory Visit: Payer: Self-pay | Admitting: Physician Assistant

## 2020-06-24 DIAGNOSIS — Z1231 Encounter for screening mammogram for malignant neoplasm of breast: Secondary | ICD-10-CM

## 2020-06-27 ENCOUNTER — Other Ambulatory Visit: Payer: Self-pay | Admitting: Physician Assistant

## 2020-06-27 DIAGNOSIS — E559 Vitamin D deficiency, unspecified: Secondary | ICD-10-CM

## 2020-06-27 NOTE — Telephone Encounter (Signed)
Pt called and an appt has already been scheduled for March 18th. Rx refilled.

## 2020-06-27 NOTE — Telephone Encounter (Signed)
Requested medication (s) are due for refill today:   Yes  Requested medication (s) are on the active medication list:   Yes  Future visit scheduled:   Yes   Last ordered: 11/26/2019 #12, 1 refill  Clinic note:   Non delegated refill   Requested Prescriptions  Pending Prescriptions Disp Refills   Vitamin D, Ergocalciferol, (DRISDOL) 1.25 MG (50000 UNIT) CAPS capsule [Pharmacy Med Name: VITAMIN D (ERGOCALCIFEROL) 1.25 MG] 12 capsule 1    Sig: TAKE 1 CAPSULE BY MOUTH EVERY 7 DAYS      Endocrinology:  Vitamins - Vitamin D Supplementation Failed - 06/27/2020  2:27 PM      Failed - 50,000 IU strengths are not delegated      Failed - Phosphate in normal range and within 360 days    No results found for: PHOS        Passed - Ca in normal range and within 360 days    Calcium  Date Value Ref Range Status  03/28/2020 9.7 8.7 - 10.3 mg/dL Final          Passed - Vitamin D in normal range and within 360 days    Vit D, 25-Hydroxy  Date Value Ref Range Status  09/07/2019 34.4 30.0 - 100.0 ng/mL Final    Comment:    Vitamin D deficiency has been defined by the Institute of Medicine and an Endocrine Society practice guideline as a level of serum 25-OH vitamin D less than 20 ng/mL (1,2). The Endocrine Society went on to further define vitamin D insufficiency as a level between 21 and 29 ng/mL (2). 1. IOM (Institute of Medicine). 2010. Dietary reference    intakes for calcium and D. [place]: The    [employer]. 2. Holick MF, Binkley [place], Bischoff-Ferrari HA, et al.    Evaluation, treatment, and prevention of vitamin D    deficiency: an Endocrine Society clinical practice    guideline. JCEM. 2011 Jul; 96(7):1911-30.           Passed - Valid encounter within last 12 months    Recent Outpatient Visits           3 months ago Hypothyroidism due to acquired atrophy of thyroid   [facility] Mar Daring, [name]   9 months ago Primary insomnia    [place], [name]   11 months ago Moderate episode of recurrent major depressive disorder [facility])   [facility] [place], [name], [name]   11 months ago Annual physical exam   [place], [name], [name]   1 year ago Annual physical exam   [place], [name], [name]       Future Appointments             In 5 days Burnette, [name], [name] [employer], [place]   In 2 weeks Marlyn Corporal, [name], [name] [employer], [place]   In 2 months Rubie Maid, MD [facility]

## 2020-07-02 ENCOUNTER — Ambulatory Visit: Payer: Self-pay | Admitting: Physician Assistant

## 2020-07-07 ENCOUNTER — Other Ambulatory Visit: Payer: Self-pay | Admitting: Physician Assistant

## 2020-07-07 DIAGNOSIS — I10 Essential (primary) hypertension: Secondary | ICD-10-CM

## 2020-07-11 ENCOUNTER — Ambulatory Visit (INDEPENDENT_AMBULATORY_CARE_PROVIDER_SITE_OTHER): Payer: 59 | Admitting: Physician Assistant

## 2020-07-11 ENCOUNTER — Other Ambulatory Visit: Payer: Self-pay

## 2020-07-11 ENCOUNTER — Encounter: Payer: Self-pay | Admitting: Physician Assistant

## 2020-07-11 VITALS — BP 131/85 | HR 84 | Temp 99.4°F | Ht 64.5 in | Wt 223.0 lb

## 2020-07-11 DIAGNOSIS — Z0000 Encounter for general adult medical examination without abnormal findings: Secondary | ICD-10-CM | POA: Diagnosis not present

## 2020-07-11 DIAGNOSIS — E034 Atrophy of thyroid (acquired): Secondary | ICD-10-CM | POA: Diagnosis not present

## 2020-07-11 DIAGNOSIS — I10 Essential (primary) hypertension: Secondary | ICD-10-CM

## 2020-07-11 DIAGNOSIS — E1122 Type 2 diabetes mellitus with diabetic chronic kidney disease: Secondary | ICD-10-CM

## 2020-07-11 DIAGNOSIS — F331 Major depressive disorder, recurrent, moderate: Secondary | ICD-10-CM

## 2020-07-11 DIAGNOSIS — N182 Chronic kidney disease, stage 2 (mild): Secondary | ICD-10-CM

## 2020-07-11 DIAGNOSIS — E7800 Pure hypercholesterolemia, unspecified: Secondary | ICD-10-CM | POA: Diagnosis not present

## 2020-07-11 DIAGNOSIS — F419 Anxiety disorder, unspecified: Secondary | ICD-10-CM

## 2020-07-11 MED ORDER — ARIPIPRAZOLE 5 MG PO TABS: 2.5000 mg | ORAL_TABLET | Freq: Every day | ORAL | 3 refills | Status: DC

## 2020-07-11 MED ORDER — LISINOPRIL-HYDROCHLOROTHIAZIDE 20-25 MG PO TABS: 1.0000 | ORAL_TABLET | Freq: Every day | ORAL | 3 refills | Status: DC

## 2020-07-11 MED ORDER — DESVENLAFAXINE SUCCINATE ER 100 MG PO TB24: 100.0000 mg | ORAL_TABLET | Freq: Every day | ORAL | 3 refills | Status: DC

## 2020-07-11 MED ORDER — LEVOTHYROXINE SODIUM 175 MCG PO TABS
175.0000 ug | ORAL_TABLET | Freq: Every day | ORAL | 3 refills | Status: DC
Start: 2020-07-11 — End: 2021-07-30

## 2020-07-11 MED ORDER — LORAZEPAM 1 MG PO TABS: 1.0000 mg | ORAL_TABLET | Freq: Three times a day (TID) | ORAL | 5 refills | Status: DC | PRN

## 2020-07-11 MED ORDER — SIMVASTATIN 10 MG PO TABS
10.0000 mg | ORAL_TABLET | Freq: Every day | ORAL | 3 refills | Status: DC
Start: 2020-07-11 — End: 2021-02-10

## 2020-07-11 MED ORDER — METOPROLOL SUCCINATE ER 25 MG PO TB24: 25.0000 mg | ORAL_TABLET | Freq: Every day | ORAL | 3 refills | Status: DC

## 2020-07-11 NOTE — Patient Instructions (Signed)
Preventive Care 84-63 Years Old, Female Preventive care refers to lifestyle choices and visits with your health care provider that can promote health and wellness. This includes:  A yearly physical exam. This is also called an annual wellness visit.  Regular dental and eye exams.  Immunizations.  Screening for certain conditions.  Healthy lifestyle choices, such as: ? Eating a healthy diet. ? Getting regular exercise. ? Not using drugs or products that contain nicotine and tobacco. ? Limiting alcohol use. What can I expect for my preventive care visit? Physical exam Your health care provider will check your:  Height and weight. These may be used to calculate your BMI (body mass index). BMI is a measurement that tells if you are at a healthy weight.  Heart rate and blood pressure.  Body temperature.  Skin for abnormal spots. Counseling Your health care provider may ask you questions about your:  Past medical problems.  Family's medical history.  Alcohol, tobacco, and drug use.  Emotional well-being.  Home life and relationship well-being.  Sexual activity.  Diet, exercise, and sleep habits.  Work and work Statistician.  Access to firearms.  Method of birth control.  Menstrual cycle.  Pregnancy history. What immunizations do I need? Vaccines are usually given at various ages, according to a schedule. Your health care provider will recommend vaccines for you based on your age, medical history, and lifestyle or other factors, such as travel or where you work.   What tests do I need? Blood tests  Lipid and cholesterol levels. These may be checked every 5 years, or more often if you are over 3 years old.  Hepatitis C test.  Hepatitis B test. Screening  Lung cancer screening. You may have this screening every year starting at age 73 if you have a 30-pack-year history of smoking and currently smoke or have quit within the past 15 years.  Colorectal cancer  screening. ? All adults should have this screening starting at age 52 and continuing until age 17. ? Your health care provider may recommend screening at age 49 if you are at increased risk. ? You will have tests every 1-10 years, depending on your results and the type of screening test.  Diabetes screening. ? This is done by checking your blood sugar (glucose) after you have not eaten for a while (fasting). ? You may have this done every 1-3 years.  Mammogram. ? This may be done every 1-2 years. ? Talk with your health care provider about when you should start having regular mammograms. This may depend on whether you have a family history of breast cancer.  BRCA-related cancer screening. This may be done if you have a family history of breast, ovarian, tubal, or peritoneal cancers.  Pelvic exam and Pap test. ? This may be done every 3 years starting at age 10. ? Starting at age 11, this may be done every 5 years if you have a Pap test in combination with an HPV test. Other tests  STD (sexually transmitted disease) testing, if you are at risk.  Bone density scan. This is done to screen for osteoporosis. You may have this scan if you are at high risk for osteoporosis. Talk with your health care provider about your test results, treatment options, and if necessary, the need for more tests. Follow these instructions at home: Eating and drinking  Eat a diet that includes fresh fruits and vegetables, whole grains, lean protein, and low-fat dairy products.  Take vitamin and mineral supplements  as recommended by your health care provider.  Do not drink alcohol if: ? Your health care provider tells you not to drink. ? You are pregnant, may be pregnant, or are planning to become pregnant.  If you drink alcohol: ? Limit how much you have to 0-1 drink a day. ? Be aware of how much alcohol is in your drink. In the [place] one drink equals one 12 oz bottle of beer (355 mL), one 5 oz glass of  wine (148 mL), or one 1 oz glass of hard liquor (44 mL).   Lifestyle  Take daily care of your teeth and gums. Brush your teeth every morning and night with fluoride toothpaste. Floss one time each day.  Stay active. Exercise for at least 30 minutes 5 or more days each week.  Do not use any products that contain nicotine or tobacco, such as cigarettes, e-cigarettes, and chewing tobacco. If you need help quitting, ask your health care provider.  Do not use drugs.  If you are sexually active, practice safe sex. Use a condom or other form of protection to prevent STIs (sexually transmitted infections).  If you do not wish to become pregnant, use a form of birth control. If you plan to become pregnant, see your health care provider for a prepregnancy visit.  If told by your health care provider, take low-dose aspirin daily starting at age 50.  Find healthy ways to cope with stress, such as: ? Meditation, yoga, or listening to music. ? Journaling. ? Talking to a trusted person. ? Spending time with friends and family. Safety  Always wear your seat belt while driving or riding in a vehicle.  Do not drive: ? If you have been drinking alcohol. Do not ride with someone who has been drinking. ? When you are tired or distracted. ? While texting.  Wear a helmet and other protective equipment during sports activities.  If you have firearms in your house, make sure you follow all gun safety procedures. What's next?  Visit your health care provider once a year for an annual wellness visit.  Ask your health care provider how often you should have your eyes and teeth checked.  Stay up to date on all vaccines. This information is not intended to replace advice given to you by your health care provider. Make sure you discuss any questions you have with your health care provider. Document Revised: 01/15/2020 Document Reviewed: 12/22/2017 Elsevier Patient Education  2021 Elsevier Inc.  

## 2020-07-11 NOTE — Progress Notes (Signed)
Complete physical exam   Patient: Regina Pierce   DOB: [birth year]   65 y.o. Female  MRN: [id number] Visit Date: 07/11/2020  Today's healthcare provider: Mar Daring, [name]   Chief Complaint  Patient presents with  . Annual Exam   Subjective    JAYLIAH BENETT is a 65 y.o. female who presents today for a complete physical exam.  She reports consuming a general diet. The patient does not participate in regular exercise at present. She generally feels well. She reports sleeping well. She does not have additional problems to discuss today.  HPI    Past Medical History:  Diagnosis Date  . Anxiety   . Depression   . Diabetes mellitus without complication (HCC)    no meds  . Hyperlipidemia   . Hypertension   . Thyroid disease    Past Surgical History:  Procedure Laterality Date  . ACHILLES TENDON SURGERY Left 05/12/2016   Procedure: ACHILLES TENDON REPAIR;  Surgeon: Samara Deist, DPM;  Location: [facility] ORS;  Service: Podiatry;  Laterality: Left;  . BONE EXOSTOSIS EXCISION Left 05/12/2016   Procedure: CALCANEAL EXOSTECTOMY AND DORSAL EXOSTECTOMY;  Surgeon: Samara Deist, DPM;  Location: [facility] ORS;  Service: Podiatry;  Laterality: Left;  . COLONOSCOPY WITH PROPOFOL N/A 07/20/2016   Procedure: COLONOSCOPY WITH PROPOFOL;  Surgeon: Jonathon Bellows, MD;  Location: [facility] ENDOSCOPY;  Service: Endoscopy;  Laterality: N/A;  . HAND SURGERY Bilateral 1999   carpal tunnel   . KNEE SURGERY  2010  . TUBAL LIGATION  1987   Social History   Socioeconomic History  . Marital status: Married    Spouse name: Not on file  . Number of children: Not on file  . Years of education: Not on file  . Highest education level: Not on file  Occupational History  . Not on file  Tobacco Use  . Smoking status: Current Every Day Smoker    Packs/day: 1.00    Years: 30.00    Pack years: 30.00    Types: Cigarettes    Last attempt to quit: 01/24/2017    Years since quitting: 3.4  . Smokeless tobacco:  Never Used  Vaping Use  . Vaping Use: Never used  Substance and Sexual Activity  . Alcohol use: No    Alcohol/week: 0.0 standard drinks  . Drug use: No  . Sexual activity: Not Currently    Birth control/protection: Post-menopausal  Other Topics Concern  . Not on file  Social History Narrative  . Not on file   Social Determinants of Health   Financial Resource Strain: Not on file  Food Insecurity: Not on file  Transportation Needs: Not on file  Physical Activity: Not on file  Stress: Not on file  Social Connections: Not on file  Intimate Partner Violence: Not on file   Family Status  Relation Name Status  . Mother  Deceased  . Father  Deceased  . Sister  Deceased at age 1       heart defect  . Brother  Alive  . Sister  Deceased       Azure accident  . Sister  Alive  . Neg Hx  (Not Specified)   Family History  Problem Relation Age of Onset  . Hypertension Sister   . Breast cancer Neg Hx   . Colon cancer Neg Hx    No Known Allergies  Patient Care Team: Rubye Beach as PCP - General (Family Medicine)   Medications: Outpatient Medications  Prior to Visit  Medication Sig  . ARIPiprazole (ABILIFY) 5 MG tablet Take 1 tablet (5 mg total) by mouth daily. (Patient taking differently: Take 5 mg by mouth daily. Take 2.5 mg daily.)  . aspirin 81 MG tablet Take 81 mg by mouth every evening.   . desvenlafaxine (PRISTIQ) 100 MG 24 hr tablet Take 1 tablet (100 mg total) by mouth daily.  Marland Kitchen levothyroxine (SYNTHROID) 175 MCG tablet Take 1 tablet (175 mcg total) by mouth daily before breakfast.  . lisinopril-hydrochlorothiazide (ZESTORETIC) 20-25 MG tablet TAKE 1 TABLET BY MOUTH ONCE DAILY  . LORazepam (ATIVAN) 1 MG tablet Take 1 tablet (1 mg total) by mouth every 8 (eight) hours as needed for anxiety.  . metoprolol succinate (TOPROL-XL) 25 MG 24 hr tablet TAKE 1 TABLET BY MOUTH ONCE DAILY  . naproxen sodium (ANAPROX) 220 MG tablet Take 440 mg by mouth daily as needed  (pain).  . norethindrone-ethinyl estradiol (FEMHRT LOW DOSE) 0.5-2.5 MG-MCG tablet TAKE 1 TABLET BY MOUTH ONCE A DAY  . simvastatin (ZOCOR) 10 MG tablet Take 1 tablet (10 mg total) by mouth at bedtime.  . Vitamin D, Ergocalciferol, (DRISDOL) 1.25 MG (50000 UNIT) CAPS capsule TAKE 1 CAPSULE BY MOUTH EVERY 7 DAYS  . zolpidem (AMBIEN CR) 12.5 MG CR tablet TAKE 1 TABLET BY MOUTH EVERY NIGHT AT BEDTIME AS NEEDED FOR SLEEP.  Marland Kitchen vitamin B-12 (CYANOCOBALAMIN) 1000 MCG tablet Take 1,000 mcg by mouth daily.   No facility-administered medications prior to visit.    Review of Systems  Constitutional: Negative.   HENT: Negative.   Eyes: Negative.   Respiratory: Negative.   Cardiovascular: Negative.   Gastrointestinal: Negative.   Endocrine: Negative.   Genitourinary: Negative.   Musculoskeletal: Positive for joint swelling. Negative for arthralgias, back pain, gait problem, myalgias, neck pain and neck stiffness.  Skin: Negative.   Allergic/Immunologic: Negative.   Neurological: Negative.   Hematological: Negative.   Psychiatric/Behavioral: Negative.       Objective    BP 131/85 (BP Location: Left Arm, Patient Position: Sitting, Cuff Size: Large)   Pulse 84   Temp 99.4 F (37.4 C) (Oral)   Ht 5' 4.5" (1.638 m)   Wt 223 lb (101.2 kg)   LMP 04/27/2007   BMI 37.69 kg/m    Physical Exam Vitals reviewed.  Constitutional:      General: She is not in acute distress.    Appearance: Normal appearance. She is well-developed. She is obese. She is not ill-appearing or diaphoretic.  HENT:     Head: Normocephalic and atraumatic.     Right Ear: Tympanic membrane, ear canal and external ear normal.     Left Ear: Tympanic membrane, ear canal and external ear normal.  Eyes:     General: No scleral icterus.       Right eye: No discharge.        Left eye: No discharge.     Extraocular Movements: Extraocular movements intact.     Conjunctiva/sclera: Conjunctivae normal.     Pupils: Pupils are  equal, round, and reactive to light.  Neck:     Thyroid: No thyromegaly.     Vascular: No carotid bruit or JVD.     Trachea: No tracheal deviation.  Cardiovascular:     Rate and Rhythm: Normal rate and regular rhythm.     Pulses: Normal pulses.     Heart sounds: Normal heart sounds. No murmur heard. No friction rub. No gallop.   Pulmonary:     Effort:  Pulmonary effort is normal. No respiratory distress.     Breath sounds: Normal breath sounds. No wheezing or rales.  Chest:     Chest wall: No tenderness.  Abdominal:     General: Abdomen is flat. Bowel sounds are normal. There is no distension.     Palpations: Abdomen is soft. There is no mass.     Tenderness: There is no abdominal tenderness. There is no guarding or rebound.  Musculoskeletal:        General: No tenderness. Normal range of motion.     Cervical back: Normal range of motion and neck supple. No tenderness.     Right lower leg: No edema.     Left lower leg: No edema.  Lymphadenopathy:     Cervical: No cervical adenopathy.  Skin:    General: Skin is warm and dry.     Capillary Refill: Capillary refill takes less than 2 seconds.     Findings: No rash.  Neurological:     General: No focal deficit present.     Mental Status: She is alert and oriented to person, place, and time. Mental status is at baseline.  Psychiatric:        Mood and Affect: Mood normal.        Behavior: Behavior normal.        Thought Content: Thought content normal.        Judgment: Judgment normal.     Last depression screening scores PHQ 2/9 Scores 03/24/2020 07/05/2019 07/03/2018  PHQ - 2 Score 0 5 0  PHQ- 9 Score 8 12 0  Exception Documentation - - -   Last fall risk screening Fall Risk  07/05/2019  Falls in the past year? 0  Number falls in past yr: 0  Injury with Fall? 0  Follow up Falls evaluation completed   Last Audit-C alcohol use screening Alcohol Use Disorder Test (AUDIT) 07/05/2019  1. How often do you have a drink  containing alcohol? 0  2. How many drinks containing alcohol do you have on a typical day when you are drinking? 0  3. How often do you have six or more drinks on one occasion? 0  AUDIT-C Score 0  Alcohol Brief Interventions/Follow-up AUDIT Score <7 follow-up not indicated   A score of 3 or more in women, and 4 or more in men indicates increased risk for alcohol abuse, EXCEPT if all of the points are from question 1   No results found for any visits on 07/11/20.  Assessment & Plan    Routine Health Maintenance and Physical Exam  Exercise Activities and Dietary recommendations Goals    . Exercise 150 minutes per week (moderate activity)       Immunization History  Administered Date(s) Administered  . Influenza Split 01/17/2009, 01/22/2010, 03/31/2011, 01/14/2012  . Influenza,inj,Quad PF,6+ Mos 04/13/2013, 05/13/2015  . Td 07/03/2018  . Tdap 12/07/2007    Health Maintenance  Topic Date Due  . COVID-19 Vaccine (1) Never done  . OPHTHALMOLOGY EXAM  Never done  . FOOT EXAM  07/03/2019  . MAMMOGRAM  07/04/2020  . INFLUENZA VACCINE  07/24/2020 (Originally 11/25/2019)  . PNEUMOCOCCAL POLYSACCHARIDE VACCINE AGE 21-64 HIGH RISK  03/24/2021 (Originally 08/07/1957)  . HEMOGLOBIN A1C  09/26/2020  . PAP SMEAR-Modifier  07/03/2021  . COLONOSCOPY (Pts 45-56yrs Insurance coverage will need to be confirmed)  07/20/2021  . TETANUS/TDAP  07/02/2028  . Hepatitis C Screening  Completed  . HIV Screening  Completed  . HPV VACCINES  Aged Out    Discussed health benefits of physical activity, and encouraged her to engage in regular exercise appropriate for her age and condition.  1. Annual physical exam Normal physical exam today. Will check labs as below and f/u pending lab results. If labs are stable and WNL she will not need to have these rechecked for one year at her next annual physical exam. She is to call the office in the meantime if she has any acute issue, questions or concerns. - CBC  w/Diff/Platelet - Comprehensive Metabolic Panel (CMET) - TSH - Lipid Panel With LDL/HDL Ratio - HgB A1c  2. Primary hypertension Stable. Diagnosis pulled for medication refill. Continue current medical treatment plan. Will check labs as below and f/u pending results. - CBC w/Diff/Platelet - Comprehensive Metabolic Panel (CMET) - Lipid Panel With LDL/HDL Ratio - HgB A1c - metoprolol succinate (TOPROL-XL) 25 MG 24 hr tablet; Take 1 tablet (25 mg total) by mouth daily.  Dispense: 90 tablet; Refill: 3 - lisinopril-hydrochlorothiazide (ZESTORETIC) 20-25 MG tablet; Take 1 tablet by mouth daily.  Dispense: 90 tablet; Refill: 3  3. Hypothyroidism due to acquired atrophy of thyroid Stable. Diagnosis pulled for medication refill. Continue current medical treatment plan. Will check labs as below and f/u pending results. - CBC w/Diff/Platelet - Comprehensive Metabolic Panel (CMET) - TSH - levothyroxine (SYNTHROID) 175 MCG tablet; Take 1 tablet (175 mcg total) by mouth daily before breakfast.  Dispense: 90 tablet; Refill: 3  4. Pure hypercholesterolemia Stable. Diagnosis pulled for medication refill. Continue current medical treatment plan. Will check labs as below and f/u pending results. - CBC w/Diff/Platelet - Comprehensive Metabolic Panel (CMET) - Lipid Panel With LDL/HDL Ratio - HgB A1c - simvastatin (ZOCOR) 10 MG tablet; Take 1 tablet (10 mg total) by mouth at bedtime.  Dispense: 90 tablet; Refill: 3  5. Anxiety Stable. Diagnosis pulled for medication refill. Continue current medical treatment plan. - LORazepam (ATIVAN) 1 MG tablet; Take 1 tablet (1 mg total) by mouth every 8 (eight) hours as needed for anxiety.  Dispense: 90 tablet; Refill: 5  6. Moderate episode of recurrent major depressive disorder (HCC) Stable. Diagnosis pulled for medication refill. Continue current medical treatment plan. - desvenlafaxine (PRISTIQ) 100 MG 24 hr tablet; Take 1 tablet (100 mg total) by mouth daily.   Dispense: 90 tablet; Refill: 3 - ARIPiprazole (ABILIFY) 5 MG tablet; Take 0.5 tablets (2.5 mg total) by mouth daily.  Dispense: 45 tablet; Refill: 3  7. Type 2 diabetes mellitus with stage 2 chronic kidney disease, without long-term current use of insulin (HCC) Stable. Diagnosis pulled for medication refill. Continue current medical treatment plan. Will check labs as below and f/u pending results. - CBC w/Diff/Platelet - Comprehensive Metabolic Panel (CMET) - Lipid Panel With LDL/HDL Ratio - HgB A1c  No follow-ups on file.     Reynolds Bowl, [name], have reviewed all documentation for this visit. The documentation on 07/11/20 for the exam, diagnosis, procedures, and orders are all accurate and complete.   Rubye Beach  [facility] [phone] (phone) [phone] (fax)  [place]

## 2020-07-12 LAB — COMPREHENSIVE METABOLIC PANEL
ALT: 20 IU/L (ref 0–32)
AST: 21 IU/L (ref 0–40)
Albumin/Globulin Ratio: 1.8 (ref 1.2–2.2)
Albumin: 4.4 g/dL (ref 3.8–4.8)
Alkaline Phosphatase: 85 IU/L (ref 44–121)
BUN/Creatinine Ratio: 21 (ref 12–28)
BUN: 17 mg/dL (ref 8–27)
Bilirubin Total: 0.7 mg/dL (ref 0.0–1.2)
CO2: 23 mmol/L (ref 20–29)
Calcium: 9.7 mg/dL (ref 8.7–10.3)
Chloride: 99 mmol/L (ref 96–106)
Creatinine, Ser: 0.81 mg/dL (ref 0.57–1.00)
Globulin, Total: 2.4 g/dL (ref 1.5–4.5)
Glucose: 132 mg/dL — ABNORMAL HIGH (ref 65–99)
Potassium: 4.2 mmol/L (ref 3.5–5.2)
Sodium: 142 mmol/L (ref 134–144)
Total Protein: 6.8 g/dL (ref 6.0–8.5)
eGFR: 82 mL/min/{1.73_m2} (ref >59)

## 2020-07-12 LAB — CBC WITH DIFFERENTIAL/PLATELET
Basophils Absolute: 0.1 10*3/uL (ref 0.0–0.2)
Basos: 1 %
EOS (ABSOLUTE): 0.1 10*3/uL (ref 0.0–0.4)
Eos: 1 %
Hematocrit: 48.3 % — ABNORMAL HIGH (ref 34.0–46.6)
Hemoglobin: 16.5 g/dL — ABNORMAL HIGH (ref 11.1–15.9)
Immature Grans (Abs): 0 10*3/uL (ref 0.0–0.1)
Immature Granulocytes: 0 %
Lymphocytes Absolute: 2.1 10*3/uL (ref 0.7–3.1)
Lymphs: 23 %
MCH: 30.9 pg (ref 26.6–33.0)
MCHC: 34.2 g/dL (ref 31.5–35.7)
MCV: 90 fL (ref 79–97)
Monocytes Absolute: 0.5 10*3/uL (ref 0.1–0.9)
Monocytes: 5 %
Neutrophils Absolute: 6.3 10*3/uL (ref 1.4–7.0)
Neutrophils: 70 %
Platelets: 162 10*3/uL (ref 150–450)
RBC: 5.34 x10E6/uL — ABNORMAL HIGH (ref 3.77–5.28)
RDW: 12.3 % (ref 11.7–15.4)
WBC: 9.1 10*3/uL (ref 3.4–10.8)

## 2020-07-12 LAB — LIPID PANEL WITH LDL/HDL RATIO
Cholesterol, Total: 202 mg/dL — ABNORMAL HIGH (ref 100–199)
HDL: 43 mg/dL (ref >39)
LDL Chol Calc (NIH): 122 mg/dL — ABNORMAL HIGH (ref 0–99)
LDL/HDL Ratio: 2.8 ratio (ref 0.0–3.2)
Triglycerides: 211 mg/dL — ABNORMAL HIGH (ref 0–149)
VLDL Cholesterol Cal: 37 mg/dL (ref 5–40)

## 2020-07-12 LAB — HEMOGLOBIN A1C
Est. average glucose Bld gHb Est-mCnc: 160 mg/dL
Hgb A1c MFr Bld: 7.2 % — ABNORMAL HIGH (ref 4.8–5.6)

## 2020-07-12 LAB — TSH: TSH: 0.747 u[IU]/mL (ref 0.450–4.500)

## 2020-07-14 ENCOUNTER — Other Ambulatory Visit: Payer: Self-pay

## 2020-07-14 ENCOUNTER — Ambulatory Visit
Admission: RE | Admit: 2020-07-14 | Discharge: 2020-07-14 | Disposition: A | Discharge status: Home / Self Care | Payer: 59 | Source: Ambulatory Visit | Attending: Physician Assistant | Admitting: Physician Assistant

## 2020-07-14 DIAGNOSIS — Z1231 Encounter for screening mammogram for malignant neoplasm of breast: Secondary | ICD-10-CM | POA: Insufficient documentation

## 2020-07-16 ENCOUNTER — Telehealth: Payer: Self-pay

## 2020-07-16 NOTE — Telephone Encounter (Signed)
Pt advised.   Thanks,   -Laura  

## 2020-07-16 NOTE — Telephone Encounter (Signed)
-----   Message from Mar Daring, [name] sent at 07/15/2020  2:47 PM EDT ----- Normal mammogram. Repeat screening in one year.

## 2020-09-08 NOTE — Patient Instructions (Incomplete)
Preventive Care 84-63 Years Old, Female Preventive care refers to lifestyle choices and visits with your health care provider that can promote health and wellness. This includes:  A yearly physical exam. This is also called an annual wellness visit.  Regular dental and eye exams.  Immunizations.  Screening for certain conditions.  Healthy lifestyle choices, such as: ? Eating a healthy diet. ? Getting regular exercise. ? Not using drugs or products that contain nicotine and tobacco. ? Limiting alcohol use. What can I expect for my preventive care visit? Physical exam Your health care provider will check your:  Height and weight. These may be used to calculate your BMI (body mass index). BMI is a measurement that tells if you are at a healthy weight.  Heart rate and blood pressure.  Body temperature.  Skin for abnormal spots. Counseling Your health care provider may ask you questions about your:  Past medical problems.  Family's medical history.  Alcohol, tobacco, and drug use.  Emotional well-being.  Home life and relationship well-being.  Sexual activity.  Diet, exercise, and sleep habits.  Work and work Statistician.  Access to firearms.  Method of birth control.  Menstrual cycle.  Pregnancy history. What immunizations do I need? Vaccines are usually given at various ages, according to a schedule. Your health care provider will recommend vaccines for you based on your age, medical history, and lifestyle or other factors, such as travel or where you work.   What tests do I need? Blood tests  Lipid and cholesterol levels. These may be checked every 5 years, or more often if you are over 3 years old.  Hepatitis C test.  Hepatitis B test. Screening  Lung cancer screening. You may have this screening every year starting at age 73 if you have a 30-pack-year history of smoking and currently smoke or have quit within the past 15 years.  Colorectal cancer  screening. ? All adults should have this screening starting at age 52 and continuing until age 17. ? Your health care provider may recommend screening at age 49 if you are at increased risk. ? You will have tests every 1-10 years, depending on your results and the type of screening test.  Diabetes screening. ? This is done by checking your blood sugar (glucose) after you have not eaten for a while (fasting). ? You may have this done every 1-3 years.  Mammogram. ? This may be done every 1-2 years. ? Talk with your health care provider about when you should start having regular mammograms. This may depend on whether you have a family history of breast cancer.  BRCA-related cancer screening. This may be done if you have a family history of breast, ovarian, tubal, or peritoneal cancers.  Pelvic exam and Pap test. ? This may be done every 3 years starting at age 10. ? Starting at age 11, this may be done every 5 years if you have a Pap test in combination with an HPV test. Other tests  STD (sexually transmitted disease) testing, if you are at risk.  Bone density scan. This is done to screen for osteoporosis. You may have this scan if you are at high risk for osteoporosis. Talk with your health care provider about your test results, treatment options, and if necessary, the need for more tests. Follow these instructions at home: Eating and drinking  Eat a diet that includes fresh fruits and vegetables, whole grains, lean protein, and low-fat dairy products.  Take vitamin and mineral supplements  as recommended by your health care provider.  Do not drink alcohol if: ? Your health care provider tells you not to drink. ? You are pregnant, may be pregnant, or are planning to become pregnant.  If you drink alcohol: ? Limit how much you have to 0-1 drink a day. ? Be aware of how much alcohol is in your drink. In the [place] one drink equals one 12 oz bottle of beer (355 mL), one 5 oz glass of  wine (148 mL), or one 1 oz glass of hard liquor (44 mL).   Lifestyle  Take daily care of your teeth and gums. Brush your teeth every morning and night with fluoride toothpaste. Floss one time each day.  Stay active. Exercise for at least 30 minutes 5 or more days each week.  Do not use any products that contain nicotine or tobacco, such as cigarettes, e-cigarettes, and chewing tobacco. If you need help quitting, ask your health care provider.  Do not use drugs.  If you are sexually active, practice safe sex. Use a condom or other form of protection to prevent STIs (sexually transmitted infections).  If you do not wish to become pregnant, use a form of birth control. If you plan to become pregnant, see your health care provider for a prepregnancy visit.  If told by your health care provider, take low-dose aspirin daily starting at age 75.  Find healthy ways to cope with stress, such as: ? Meditation, yoga, or listening to music. ? Journaling. ? Talking to a trusted person. ? Spending time with friends and family. Safety  Always wear your seat belt while driving or riding in a vehicle.  Do not drive: ? If you have been drinking alcohol. Do not ride with someone who has been drinking. ? When you are tired or distracted. ? While texting.  Wear a helmet and other protective equipment during sports activities.  If you have firearms in your house, make sure you follow all gun safety procedures. What's next?  Visit your health care provider once a year for an annual wellness visit.  Ask your health care provider how often you should have your eyes and teeth checked.  Stay up to date on all vaccines. This information is not intended to replace advice given to you by your health care provider. Make sure you discuss any questions you have with your health care provider. Document Revised: 01/15/2020 Document Reviewed: 12/22/2017 Elsevier Patient Education  2021 [place] Breast self-awareness is knowing how your breasts look and feel. Doing breast self-awareness is important. It allows you to catch a breast problem early while it is still small and can be treated. All women should do breast self-awareness, including women who have had breast implants. Tell your doctor if you notice a change in your breasts. What you need:  A mirror.  A well-lit room. How to do a breast self-exam A breast self-exam is one way to learn what is normal for your breasts and to check for changes. To do a breast self-exam: Look for changes 1. Take off all the clothes above your waist. 2. Stand in front of a mirror in a room with good lighting. 3. Put your hands on your hips. 4. Push your hands down. 5. Look at your breasts and nipples in the mirror to see if one breast or nipple looks different from the other. Check to see if: ? The shape of one breast is different. ? The size of  one breast is different. ? There are wrinkles, dips, and bumps in one breast and not the other. 6. Look at each breast for changes in the skin, such as: ? Redness. ? Scaly areas. 7. Look for changes in your nipples, such as: ? Liquid around the nipples. ? Bleeding. ? Dimpling. ? Redness. ? A change in where the nipples are.   Feel for changes 1. Lie on your back on the floor. 2. Feel each breast. To do this, follow these steps: ? Pick a breast to feel. ? Put the arm closest to that breast above your head. ? Use your other arm to feel the nipple area of your breast. Feel the area with the pads of your three middle fingers by making small circles with your fingers. For the first circle, press lightly. For the second circle, press harder. For the third circle, press even harder. ? Keep making circles with your fingers at the different pressures as you move down your breast. Stop when you feel your ribs. ? Move your fingers a little toward the center of your body. ? Start making  circles with your fingers again, this time going up until you reach your collarbone. ? Keep making up-and-down circles until you reach your armpit. Remember to keep using the three pressures. ? Feel the other breast in the same way. 3. Sit or stand in the tub or shower. 4. With soapy water on your skin, feel each breast the same way you did in step 2 when you were lying on the floor.   Write down what you find Writing down what you find can help you remember what to tell your doctor. Write down:  What is normal for each breast.  Any changes you find in each breast, including: ? The kind of changes you find. ? Whether you have pain. ? Size and location of any lumps.  When you last had your menstrual period. General tips  Check your breasts every month.  If you are breastfeeding, the best time to check your breasts is after you feed your baby or after you use a breast pump.  If you get menstrual periods, the best time to check your breasts is 5-7 days after your menstrual period is over.  With time, you will become comfortable with the self-exam, and you will begin to know if there are changes in your breasts. Contact a doctor if you:  See a change in the shape or size of your breasts or nipples.  See a change in the skin of your breast or nipples, such as red or scaly skin.  Have fluid coming from your nipples that is not normal.  Find a lump or thick area that was not there before.  Have pain in your breasts.  Have any concerns about your breast health. Summary  Breast self-awareness includes looking for changes in your breasts, as well as feeling for changes within your breasts.  Breast self-awareness should be done in front of a mirror in a well-lit room.  You should check your breasts every month. If you get menstrual periods, the best time to check your breasts is 5-7 days after your menstrual period is over.  Let your doctor know of any changes you see in your  breasts, including changes in size, changes on the skin, pain or tenderness, or fluid from your nipples that is not normal. This information is not intended to replace advice given to you by your health care provider. Make sure  you discuss any questions you have with your health care provider. Document Revised: 11/29/2017 Document Reviewed: 11/29/2017 Elsevier Patient Education  [place].

## 2020-09-09 ENCOUNTER — Encounter: Payer: 59 | Admitting: Obstetrics and Gynecology

## 2020-09-09 DIAGNOSIS — Z01419 Encounter for gynecological examination (general) (routine) without abnormal findings: Secondary | ICD-10-CM

## 2020-12-01 ENCOUNTER — Other Ambulatory Visit: Payer: Self-pay | Admitting: Physician Assistant

## 2020-12-01 DIAGNOSIS — F5101 Primary insomnia: Secondary | ICD-10-CM

## 2021-01-08 ENCOUNTER — Telehealth: Payer: Self-pay

## 2021-01-08 NOTE — Telephone Encounter (Signed)
Copied from [employer] [id number]. Topic: Quick Sport and exercise psychologist Patient (Clinic Use ONLY) >> Jan 08, 2021 10:43 AM Mercie Eon, Helene Kelp wrote: Reason for CRM: LMTRS-Contact office to work pt in to appt slot with ok for 20 in appt notes >> Jan 08, 2021 12:41 PM Yvette Rack wrote: Pt stated she was returning call from a missed call she had. Cb# [phone]

## 2021-01-08 NOTE — Telephone Encounter (Signed)
Copied from [place] [phone]. Topic: Quick Sport and exercise psychologist Patient (Clinic Use ONLY) >> Jan 08, 2021 10:43 AM Mercie Eon, Helene Kelp wrote: Reason for CRM: LMTRS-Contact office to work pt in to appt slot with ok for 20 in appt notes >> Jan 08, 2021 12:41 PM Yvette Rack wrote: Pt stated she was returning call from a missed call she had. Cb# [phone]

## 2021-01-09 ENCOUNTER — Telehealth: Payer: Self-pay | Admitting: Physician Assistant

## 2021-01-09 ENCOUNTER — Ambulatory Visit: Payer: Self-pay | Admitting: *Deleted

## 2021-01-09 NOTE — Telephone Encounter (Signed)
Another message was sent regarding this pt. Please review. Thanks!

## 2021-01-09 NOTE — Telephone Encounter (Signed)
no

## 2021-01-09 NOTE — Telephone Encounter (Signed)
Reason for Disposition  Symptoms interfere with sleep  Answer Assessment - Initial Assessment Questions 1. CONCERN: "Did anything happen that prompted you to call today?"      Nothing happened 2. ANXIETY SYMPTOMS: "Can you describe how you (your loved one; patient) have been feeling?" (e.g., tense, restless, panicky, anxious, keyed up, overwhelmed, sense of impending doom).      Anxious, restless 3. ONSET: "How long have you been feeling this way?" (e.g., hours, days, weeks)     2 days ago 4. SEVERITY: "How would you rate the level of anxiety?" (e.g., 0 - 10; or mild, moderate, severe).     severe 5. FUNCTIONAL IMPAIRMENT: "How have these feelings affected your ability to do daily activities?" "Have you had more difficulty than usual doing your normal daily activities?" (e.g., getting better, same, worse; self-care, school, work, interactions)     No interest in daily activities- patient is functioning 6. HISTORY: "Have you felt this way before?" "Have you ever been diagnosed with an anxiety problem in the past?" (e.g., generalized anxiety disorder, panic attacks, PTSD). If Yes, ask: "How was this problem treated?" (e.g., medicines, counseling, etc.)     no 7. RISK OF HARM - SUICIDAL IDEATION: "Do you ever have thoughts of hurting or killing yourself?" If Yes, ask:  "Do you have these feelings now?" "Do you have a plan on how you would do this?"     no 8. TREATMENT:  "What has been done so far to treat this anxiety?" (e.g., medicines, relaxation strategies). "What has helped?"     Medication- lorazepam, trying relaxation- not helping  9. TREATMENT - THERAPIST: "Do you have a counselor or therapist? Name?"     no 10. POTENTIAL TRIGGERS: "Do you drink caffeinated beverages (e.g., coffee, colas, teas), and how much daily?" "Do you drink alcohol or use any drugs?" "Have you started any new medicines recently?"     Yes- coffee, no alcohol or drugs, no new medication 10. PATIENT SUPPORT: "Who is  with you now?" "Who do you live with?" "Do you have family or friends who you can talk to?"        No one, husband, husband is supportive 60. OTHER SYMPTOMS: "Do you have any other symptoms?" (e.g., feeling depressed, trouble concentrating, trouble sleeping, trouble breathing, palpitations or fast heartbeat, chest pain, sweating, nausea, or diarrhea)       Trouble sleeping 12. PREGNANCY: "Is there any chance you are pregnant?" "When was your last menstrual period?"       N/a  Protocols used: Anxiety and Panic Attack-A-AH

## 2021-01-09 NOTE — Telephone Encounter (Signed)
Patient is calling to report that she is out of her anxiety medication- lorazepam- and she has had increased anxiety and trouble sleeping for 2 days. Patient has appointment scheduled for 9/29 and she can fill her prescription 9/26- but is requesting an early refill due to her symptoms. Patient advised I would send request for review.

## 2021-01-09 NOTE — Telephone Encounter (Signed)
Patient is requesting to have her anxiety medication refilled early. Please advise. Thanks!

## 2021-01-09 NOTE — Telephone Encounter (Signed)
See triage note.

## 2021-01-09 NOTE — Telephone Encounter (Signed)
Medication: LORazepam (ATIVAN) 1 MG tablet [id number]   Has the patient contacted their pharmacy? YES - Pt called the pharmacy requesting early request. Pharmacy not able to refill until 01/19/21. Pt states that she having a lot of anxiety (Agent: If no, request that the patient contact the pharmacy for the refill.) (Agent: If yes, when and what did the pharmacy advise?)  Preferred Pharmacy (with phone number or street name): [facility] - [place], [place] - [address] [place] [address] Phone: [phone] Fax: [phone] Hours: Not open 24 hours   Has the patient been seen for an appointment in the last year OR does the patient have an upcoming appointment? YES Apt 07/11/20  Agent: Please be advised that RX refills may take up to 3 business days. We ask that you follow-up with your pharmacy.

## 2021-01-10 ENCOUNTER — Other Ambulatory Visit: Payer: Self-pay | Admitting: Physician Assistant

## 2021-01-10 DIAGNOSIS — F419 Anxiety disorder, unspecified: Secondary | ICD-10-CM

## 2021-01-12 ENCOUNTER — Ambulatory Visit: Payer: 59 | Admitting: Family Medicine

## 2021-01-12 ENCOUNTER — Other Ambulatory Visit: Payer: Self-pay | Admitting: Family Medicine

## 2021-01-12 DIAGNOSIS — F419 Anxiety disorder, unspecified: Secondary | ICD-10-CM

## 2021-01-12 NOTE — Telephone Encounter (Signed)
Pt is calling to check on the status of her medication refill. She states that she is having bad anxiety. And she took her last pill on Friday. Please advise CB- [phone]

## 2021-01-12 NOTE — Telephone Encounter (Signed)
Please review for Dr. Brita Romp   LOV: 07/11/2020 NOV 01/22/2021 (Pt was originally scheduled for today (01/12/2021) but it had to be reschedule do to the provider being off.)   Last Refill: 07/11/2020 #90 5 Refills.    Thanks,   -Mickel Baas

## 2021-01-12 NOTE — Telephone Encounter (Signed)
Copied from [place] [phone]. Topic: Quick Communication - Rx Refill/Question >> Jan 12, 2021  9:20 AM Tessa Lerner A wrote: Medication: LORazepam (ATIVAN) 1 MG tablet [id number]  Has the patient contacted their pharmacy? Yes.   (Agent: If no, request that the patient contact the pharmacy for the refill.) (Agent: If yes, when and what did the pharmacy advise?)  Preferred Pharmacy (with phone number or street name): [facility], [place] - [place]  Phone:  [phone] Fax:  [phone]  Has the patient been seen for an appointment in the last year OR does the patient have an upcoming appointment? Yes.    Agent: Please be advised that RX refills may take up to 3 business days. We ask that you follow-up with your pharmacy.

## 2021-01-12 NOTE — Telephone Encounter (Signed)
Requested medication (s) are due for refill today: yes  Requested medication (s) are on the active medication list: yes  Last refill:  07/11/20 #90 5 refills  Future visit scheduled: yes in 1 week  Notes to clinic:  not delegated per protocol. Do you want to give courtesy refill?     Requested Prescriptions  Pending Prescriptions Disp Refills   LORazepam (ATIVAN) 1 MG tablet 90 tablet 5    Sig: Take 1 tablet (1 mg total) by mouth every 8 (eight) hours as needed for anxiety.     Not Delegated - Psychiatry:  Anxiolytics/Hypnotics Failed - 01/12/2021  4:32 PM      Failed - This refill cannot be delegated      Failed - Urine Drug Screen completed in last 360 days      Failed - Valid encounter within last 6 months    Recent Outpatient Visits           6 months ago Annual physical exam   [facility] [place], [name]   9 months ago Hypothyroidism due to acquired atrophy of thyroid   [place], [name], [name]   1 year ago Primary insomnia   [place] [place], [name]   1 year ago Moderate episode of recurrent major depressive disorder [facility])   [facility] [place], [name], [name]   1 year ago Annual physical exam   [place], [name], [name]       Future Appointments             In 1 week Bacigalupo, Dionne Bucy, MD [facility], [place]

## 2021-01-13 MED ORDER — LORAZEPAM 1 MG PO TABS
1.0000 mg | ORAL_TABLET | Freq: Three times a day (TID) | ORAL | 0 refills | Status: DC | PRN
Start: 2021-01-13 — End: 2021-02-10

## 2021-01-22 ENCOUNTER — Encounter: Payer: Self-pay | Admitting: Family Medicine

## 2021-01-22 ENCOUNTER — Ambulatory Visit (INDEPENDENT_AMBULATORY_CARE_PROVIDER_SITE_OTHER): Payer: 59 | Admitting: Family Medicine

## 2021-01-22 ENCOUNTER — Other Ambulatory Visit: Payer: Self-pay

## 2021-01-22 VITALS — BP 130/72 | HR 80 | Temp 98.4°F | Resp 16 | Ht 64.5 in | Wt 227.0 lb

## 2021-01-22 DIAGNOSIS — E1122 Type 2 diabetes mellitus with diabetic chronic kidney disease: Secondary | ICD-10-CM | POA: Diagnosis not present

## 2021-01-22 DIAGNOSIS — E1169 Type 2 diabetes mellitus with other specified complication: Secondary | ICD-10-CM

## 2021-01-22 DIAGNOSIS — E785 Hyperlipidemia, unspecified: Secondary | ICD-10-CM

## 2021-01-22 DIAGNOSIS — F331 Major depressive disorder, recurrent, moderate: Secondary | ICD-10-CM | POA: Insufficient documentation

## 2021-01-22 DIAGNOSIS — N182 Chronic kidney disease, stage 2 (mild): Secondary | ICD-10-CM | POA: Diagnosis not present

## 2021-01-22 DIAGNOSIS — I152 Hypertension secondary to endocrine disorders: Secondary | ICD-10-CM

## 2021-01-22 DIAGNOSIS — F5101 Primary insomnia: Secondary | ICD-10-CM

## 2021-01-22 DIAGNOSIS — E1159 Type 2 diabetes mellitus with other circulatory complications: Secondary | ICD-10-CM

## 2021-01-22 DIAGNOSIS — E034 Atrophy of thyroid (acquired): Secondary | ICD-10-CM

## 2021-01-22 DIAGNOSIS — F411 Generalized anxiety disorder: Secondary | ICD-10-CM

## 2021-01-22 LAB — POCT GLYCOSYLATED HEMOGLOBIN (HGB A1C)
Est. average glucose Bld gHb Est-mCnc: 163
Hemoglobin A1C: 7.3 % — AB (ref 4.0–5.6)

## 2021-01-22 MED ORDER — ARIPIPRAZOLE 5 MG PO TABS: 5.0000 mg | ORAL_TABLET | Freq: Every day | ORAL | 1 refills | Status: DC

## 2021-01-22 NOTE — Assessment & Plan Note (Signed)
-   Chronic, previously stable - Recheck TSH - Continue Synthroid

## 2021-01-22 NOTE — Assessment & Plan Note (Signed)
-   Chronic and well-controlled - Recheck CMP - Continue Lisinopril-HCTZ

## 2021-01-22 NOTE — Assessment & Plan Note (Signed)
-   Chronic, stable, not currently managed with medications - May consider starting medications if A1c continues to increase - Provided patient education materials on diet and exercise - Encouraged pt. to schedule annual screening eye exam - Follow up in 3 months

## 2021-01-22 NOTE — Progress Notes (Signed)
Established patient visit   Patient: Regina Pierce   DOB: 1955-06-14   65 y.o. Female  MRN: [id number] Visit Date: 01/22/2021  Today's healthcare provider: Lavon Paganini, MD   Chief Complaint  Patient presents with   Follow-up    Subjective    HPI  Type 2 Diabetes - Not currently managed on medications - Due for annual eye exam  Hypertension - Currently taking Lisinopril-HCTZ; denies side effects - Denies chest pain, SOB, & leg swelling  Hyperlipidemia - Currently taking Simvastatin; denies myalgias  Hypothyroidism - Currently taking Synthroid - Denies cold intolerance, constipation, & fatigue  Depression & Anxiety - Currently taking Abilify, Desvenlafaxine, & Lorazepam - Pt. states that she takes lorazepam q8 hours daily - Reports recent worsening of anxiety, states "I wake up with it" - Pt. is unable to identify recent triggers or reasons for worsened anxiety  Insomnia - Currently taking Ambien prn 4x/week - Reports that sleep difficulties have been stable    Medications: Outpatient Medications Prior to Visit  Medication Sig   aspirin 81 MG tablet Take 81 mg by mouth every evening.    desvenlafaxine (PRISTIQ) 100 MG 24 hr tablet Take 1 tablet (100 mg total) by mouth daily.   levothyroxine (SYNTHROID) 175 MCG tablet Take 1 tablet (175 mcg total) by mouth daily before breakfast.   lisinopril-hydrochlorothiazide (ZESTORETIC) 20-25 MG tablet Take 1 tablet by mouth daily.   LORazepam (ATIVAN) 1 MG tablet Take 1 tablet (1 mg total) by mouth every 8 (eight) hours as needed for anxiety.   metoprolol succinate (TOPROL-XL) 25 MG 24 hr tablet Take 1 tablet (25 mg total) by mouth daily.   naproxen sodium (ANAPROX) 220 MG tablet Take 440 mg by mouth daily as needed (pain).   norethindrone-ethinyl estradiol (FEMHRT LOW DOSE) 0.5-2.5 MG-MCG tablet TAKE 1 TABLET BY MOUTH ONCE A DAY   simvastatin (ZOCOR) 10 MG tablet Take 1 tablet (10 mg total) by mouth at bedtime.    Vitamin D, Ergocalciferol, (DRISDOL) 1.25 MG (50000 UNIT) CAPS capsule TAKE 1 CAPSULE BY MOUTH EVERY 7 DAYS   zolpidem (AMBIEN CR) 12.5 MG CR tablet TAKE 1 TABLET BY MOUTH EVERY NIGHT AT BEDTIME AS NEEDED FOR SLEEP.   [DISCONTINUED] ARIPiprazole (ABILIFY) 5 MG tablet Take 0.5 tablets (2.5 mg total) by mouth daily.   No facility-administered medications prior to visit.    Review of Systems  Constitutional:  Negative for activity change, appetite change, fatigue and fever.  HENT: Negative.    Eyes:  Negative for visual disturbance.  Respiratory:  Negative for chest tightness and shortness of breath.   Cardiovascular:  Negative for chest pain and leg swelling.  Gastrointestinal: Negative.   Endocrine: Negative for polydipsia, polyphagia and polyuria.  Genitourinary: Negative.   Musculoskeletal: Negative.   Skin: Negative.   Psychiatric/Behavioral:  Positive for sleep disturbance. The patient is nervous/anxious.       Objective    BP 130/72 (BP Location: Left Arm, Patient Position: Sitting, Cuff Size: Large)   Pulse 80   Temp 98.4 F (36.9 C) (Oral)   Resp 16   Ht 5' 4.5" (1.638 m)   Wt 227 lb (103 kg)   LMP 04/27/2007   BMI 38.36 kg/m  {Show previous vital signs (optional):23777}   Physical Exam Constitutional:      General: She is not in acute distress.    Appearance: Normal appearance. She is obese.  HENT:     Head: Normocephalic and atraumatic.  Right Ear: External ear normal.     Left Ear: External ear normal.  Eyes:     Conjunctiva/sclera: Conjunctivae normal.  Cardiovascular:     Rate and Rhythm: Normal rate and regular rhythm.     Pulses: Normal pulses.     Heart sounds: Normal heart sounds.  Pulmonary:     Effort: Pulmonary effort is normal.     Breath sounds: Normal breath sounds.  Skin:    General: Skin is warm and dry.  Neurological:     Mental Status: She is alert.  Psychiatric:        Behavior: Behavior normal.        Thought Content: Thought  content normal.     Results for orders placed or performed in visit on 01/22/21  POCT glycosylated hemoglobin (Hb A1C)  Result Value Ref Range   Hemoglobin A1C 7.3 (A) 4.0 - 5.6 %   Est. average glucose Bld gHb Est-mCnc 163     Assessment & Plan     Problem List Items Addressed This Visit       Cardiovascular and Mediastinum   Hypertension associated with diabetes ([place]) - Primary    - Chronic and well-controlled - Recheck CMP - Continue Lisinopril-HCTZ      Relevant Orders   Comprehensive metabolic panel     Endocrine   Hypothyroidism    - Chronic, previously stable - Recheck TSH - Continue Synthroid      Relevant Orders   TSH   Hyperlipidemia associated with type 2 diabetes mellitus (HCC)    - Chronic, previously stable - Recheck lipid panel - Continue simvastatin      Relevant Orders   Comprehensive metabolic panel   Lipid panel   Type 2 diabetes mellitus with stage 2 chronic kidney disease, without long-term current use of insulin (HCC)    - Chronic, stable, not currently managed with medications - May consider starting medications if A1c continues to increase - Provided patient education materials on diet and exercise - Encouraged pt. to schedule annual screening eye exam - Follow up in 3 months      Relevant Orders   POCT glycosylated hemoglobin (Hb A1C) (Completed)     Other   Anxiety    - Poorly controlled, progressive problem - Increase Abilify to 5 mg daily - Continue Desvenlaxine; continue Lorazepam prn for anxiety attacks - Pt. declines referrals to outpatient therapy & psychiatry at this time      Primary insomnia    - Chronic and stable - Continue Ambien prn for insomnia - Pt. declines referral to psychiatry at this time      Moderate episode of recurrent major depressive disorder ([place])    - Poorly controlled, progressive problem - Increase Abilify to 5 mg daily - Continue Desvenlaxine; continue Lorazepam prn for anxiety attacks -  Pt. declines referrals to outpatient therapy & psychiatry at this time      Relevant Medications   ARIPiprazole (ABILIFY) 5 MG tablet     Return in about 3 months (around 04/23/2021) for chronic disease f/u, With new PCP.        Percell Locus, MS3   Patient seen along with MS3 student Percell Locus. I personally evaluated this patient along with the student, and verified all aspects of the history, physical exam, and medical decision making as documented by the student. I agree with the student's documentation and have made all necessary edits.  Sharmane Dame, Dionne Bucy, MD, MPH [facility]

## 2021-01-22 NOTE — Assessment & Plan Note (Signed)
-   Chronic, previously stable - Recheck lipid panel - Continue simvastatin

## 2021-01-22 NOTE — Assessment & Plan Note (Addendum)
-   Poorly controlled, progressive problem - Increase Abilify to 5 mg daily - Continue Desvenlaxine; continue Lorazepam prn for anxiety attacks - Pt. declines referrals to outpatient therapy & psychiatry at this time

## 2021-01-22 NOTE — Assessment & Plan Note (Signed)
-   Poorly controlled, progressive problem - Increase Abilify to 5 mg daily - Continue Desvenlaxine; continue Lorazepam prn for anxiety attacks - Pt. declines referrals to outpatient therapy & psychiatry at this time

## 2021-01-22 NOTE — Assessment & Plan Note (Signed)
-   Chronic and stable - Continue Ambien prn for insomnia - Pt. declines referral to psychiatry at this time

## 2021-01-26 ENCOUNTER — Telehealth: Payer: Self-pay | Admitting: Family Medicine

## 2021-01-26 NOTE — Telephone Encounter (Signed)
Pt is calling to ask where she should go to get her labs redrawn. Pt states she was given papers but does not know where to go. Orders are not seen in [place].   Please advise CB [phone]

## 2021-01-26 NOTE — Telephone Encounter (Signed)
Patient advised to come back to our office for blood work to be done.

## 2021-01-27 LAB — COMPREHENSIVE METABOLIC PANEL
ALT: 15 IU/L (ref 0–32)
AST: 15 IU/L (ref 0–40)
Albumin/Globulin Ratio: 1.9 (ref 1.2–2.2)
Albumin: 4.1 g/dL (ref 3.8–4.8)
Alkaline Phosphatase: 84 IU/L (ref 44–121)
BUN/Creatinine Ratio: 17 (ref 12–28)
BUN: 14 mg/dL (ref 8–27)
Bilirubin Total: 0.5 mg/dL (ref 0.0–1.2)
CO2: 24 mmol/L (ref 20–29)
Calcium: 9.3 mg/dL (ref 8.7–10.3)
Chloride: 98 mmol/L (ref 96–106)
Creatinine, Ser: 0.83 mg/dL (ref 0.57–1.00)
Globulin, Total: 2.2 g/dL (ref 1.5–4.5)
Glucose: 159 mg/dL — ABNORMAL HIGH (ref 70–99)
Potassium: 3.9 mmol/L (ref 3.5–5.2)
Sodium: 138 mmol/L (ref 134–144)
Total Protein: 6.3 g/dL (ref 6.0–8.5)
eGFR: 79 mL/min/{1.73_m2} (ref >59)

## 2021-01-27 LAB — LIPID PANEL
Chol/HDL Ratio: 4.3 ratio (ref 0.0–4.4)
Cholesterol, Total: 172 mg/dL (ref 100–199)
HDL: 40 mg/dL (ref >39)
LDL Chol Calc (NIH): 99 mg/dL (ref 0–99)
Triglycerides: 189 mg/dL — ABNORMAL HIGH (ref 0–149)
VLDL Cholesterol Cal: 33 mg/dL (ref 5–40)

## 2021-01-27 LAB — TSH: TSH: 0.905 u[IU]/mL (ref 0.450–4.500)

## 2021-02-09 ENCOUNTER — Telehealth: Payer: Self-pay

## 2021-02-09 ENCOUNTER — Other Ambulatory Visit: Payer: Self-pay | Admitting: Family Medicine

## 2021-02-09 DIAGNOSIS — F419 Anxiety disorder, unspecified: Secondary | ICD-10-CM

## 2021-02-09 NOTE — Telephone Encounter (Signed)
Patient advised of lab results. Patient agreed to increase medication for statin. Please send new prescription to [facility]. Thank you.

## 2021-02-09 NOTE — Telephone Encounter (Signed)
Last refill:01/13/2021 # 35, with no refills  Last office visit: 01/22/2021 Next office visit: 04/23/2021

## 2021-02-09 NOTE — Telephone Encounter (Signed)
Copied from [place] [phone]. Topic: General - Other >> Feb 09, 2021  3:32 PM Yvette Rack wrote: Reason for CRM: Pt called for lab results. Pt requests call back. Cb# [phone]

## 2021-02-10 ENCOUNTER — Other Ambulatory Visit: Payer: Self-pay | Admitting: Family Medicine

## 2021-02-10 MED ORDER — SIMVASTATIN 40 MG PO TABS
40.0000 mg | ORAL_TABLET | Freq: Every day | ORAL | 3 refills | Status: DC
Start: 2021-02-10 — End: 2021-08-11

## 2021-02-11 ENCOUNTER — Other Ambulatory Visit: Payer: Self-pay | Admitting: Family Medicine

## 2021-02-11 DIAGNOSIS — F5101 Primary insomnia: Secondary | ICD-10-CM

## 2021-02-11 NOTE — Telephone Encounter (Signed)
Requested medications are due for refill today.  yes  Requested medications are on the active medications list.  yes  Last refill. 12/02/2020  Future visit scheduled.   yes  Notes to clinic.  Medication not delegated.

## 2021-03-12 ENCOUNTER — Other Ambulatory Visit: Payer: Self-pay | Admitting: Family Medicine

## 2021-03-12 DIAGNOSIS — F419 Anxiety disorder, unspecified: Secondary | ICD-10-CM

## 2021-03-13 NOTE — Telephone Encounter (Signed)
Pt called to check refill status/ pt is out of LORazepam (ATIVAN) 1 MG tablet/ please advise when sent to [place]

## 2021-03-30 ENCOUNTER — Other Ambulatory Visit: Payer: Self-pay | Admitting: Physician Assistant

## 2021-03-30 DIAGNOSIS — E559 Vitamin D deficiency, unspecified: Secondary | ICD-10-CM

## 2021-04-11 ENCOUNTER — Other Ambulatory Visit: Payer: Self-pay | Admitting: Family Medicine

## 2021-04-11 DIAGNOSIS — F419 Anxiety disorder, unspecified: Secondary | ICD-10-CM

## 2021-04-12 NOTE — Telephone Encounter (Signed)
Requested medication (s) are due for refill today: no  Requested medication (s) are on the active medication list: yes  Last refill:  03/13/21  Future visit scheduled: yes  Notes to clinic:  med not delegated to NT to RF   Requested Prescriptions  Pending Prescriptions Disp Refills   LORazepam (ATIVAN) 1 MG tablet [Pharmacy Med Name: LORAZEPAM 1 MG TAB] 90 tablet     Sig: TAKE 1 TABLET BY MOUTH EVERY 8 HOURS AS NEEDED FOR ANXIETY     Not Delegated - Psychiatry:  Anxiolytics/Hypnotics Failed - 04/11/2021 11:40 AM      Failed - This refill cannot be delegated      Failed - Urine Drug Screen completed in last 360 days      Passed - Valid encounter within last 6 months    Recent Outpatient Visits           2 months ago Hypertension associated with diabetes [facility])   [facility] [place], Dionne Bucy, MD   9 months ago Annual physical exam   [facility] [place], [place], [name]   1 year ago Hypothyroidism due to acquired atrophy of thyroid   [place], [name], [name]   1 year ago Primary insomnia   [place], [place], [name]   1 year ago Moderate episode of recurrent major depressive disorder [facility])   [facility] [place], [name], [name]       Future Appointments             In 1 week Gwyneth Sprout, [place], [facility]

## 2021-04-23 ENCOUNTER — Ambulatory Visit (INDEPENDENT_AMBULATORY_CARE_PROVIDER_SITE_OTHER): Payer: 59 | Admitting: Family Medicine

## 2021-04-23 ENCOUNTER — Encounter: Payer: Self-pay | Admitting: Family Medicine

## 2021-04-23 VITALS — Ht 64.5 in | Wt 199.0 lb

## 2021-04-23 DIAGNOSIS — I152 Hypertension secondary to endocrine disorders: Secondary | ICD-10-CM

## 2021-04-23 DIAGNOSIS — F411 Generalized anxiety disorder: Secondary | ICD-10-CM

## 2021-04-23 DIAGNOSIS — E1169 Type 2 diabetes mellitus with other specified complication: Secondary | ICD-10-CM

## 2021-04-23 DIAGNOSIS — E1122 Type 2 diabetes mellitus with diabetic chronic kidney disease: Secondary | ICD-10-CM

## 2021-04-23 DIAGNOSIS — E034 Atrophy of thyroid (acquired): Secondary | ICD-10-CM | POA: Diagnosis not present

## 2021-04-23 DIAGNOSIS — E1159 Type 2 diabetes mellitus with other circulatory complications: Secondary | ICD-10-CM

## 2021-04-23 DIAGNOSIS — Z720 Tobacco use: Secondary | ICD-10-CM

## 2021-04-23 DIAGNOSIS — E785 Hyperlipidemia, unspecified: Secondary | ICD-10-CM

## 2021-04-23 DIAGNOSIS — N182 Chronic kidney disease, stage 2 (mild): Secondary | ICD-10-CM

## 2021-04-23 DIAGNOSIS — F5101 Primary insomnia: Secondary | ICD-10-CM

## 2021-04-23 NOTE — Assessment & Plan Note (Signed)
Discussed addition of Metformin or referral to [facility] lifestyle center Pt refused as she 'does not have DM' Pt educated on based on lab work from 03/2020 to 06/2020 DM markers progressed and there in indicated that she does have T2DM Continue to recommend balanced, lower carb meals. Smaller meal size, adding snacks. Choosing water as drink of choice and increasing purposeful exercise.

## 2021-04-23 NOTE — Assessment & Plan Note (Signed)
Chronic, previously stable Patient does not check blood pressure Denies CP, SOB, DOE Continues to smoke Remains on dual agent medication Denies need for refills at this time

## 2021-04-23 NOTE — Assessment & Plan Note (Addendum)
Chronic, previously stable Self reported weight of 28# which indicated weight loss Pt however does not have access to a scale and this was a guess Continue to recommend diet low in saturated fat and regular exercise - 30 min at least 5 times per week LDL goal of <70

## 2021-04-23 NOTE — Assessment & Plan Note (Signed)
Continues to smoke 1 ppd Refused to address smoking cessation at this time

## 2021-04-23 NOTE — Assessment & Plan Note (Signed)
Remains on ambien; aware of age limiting factors Does not wish to come off medication at this time

## 2021-04-23 NOTE — Assessment & Plan Note (Signed)
Chronic, stable Denies additional stressors during the holidays

## 2021-04-23 NOTE — Progress Notes (Signed)
Virtual telephone visit    Virtual Visit via Telephone Note   This visit type was conducted due to national recommendations for restrictions regarding the COVID-19 Pandemic (e.g. social distancing) in an effort to limit this patient's exposure and mitigate transmission in our community. Due to her co-morbid illnesses, this patient is at least at moderate risk for complications without adequate follow up. This format is felt to be most appropriate for this patient at this time. The patient did not have access to video technology or had technical difficulties with video requiring transitioning to audio format only (telephone). Physical exam was limited to content and character of the telephone converstion.    Patient location: home, contacted via phone- [phone] Provider location: [facility]  I discussed the limitations of evaluation and management by telemedicine and the availability of in person appointments. The patient expressed understanding and agreed to proceed.   Visit Date: 04/23/2021  Today's healthcare provider: Gwyneth Sprout, FNP   Chief Complaint  Patient presents with   Diabetes   Hypertension   Hyperlipidemia   Hypothyroidism   Subjective    Diabetes Pertinent negatives for hypoglycemia include no nervousness/anxiousness. Pertinent negatives for diabetes include no chest pain and no fatigue.  Hypertension Pertinent negatives include no chest pain, palpitations or shortness of breath.  Hyperlipidemia Pertinent negatives include no chest pain or shortness of breath.   Diabetes Mellitus Type II, follow-up  Lab Results  Component Value Date   HGBA1C 7.3 (A) 01/22/2021   HGBA1C 7.2 (H) 07/11/2020   HGBA1C 6.5 (H) 03/28/2020   Last seen for diabetes 3 months ago.  Management since then includes continuing the same treatment. She reports excellent compliance with treatment. She is not having side effects.   Home blood sugar records:  not  being checked  Episodes of hypoglycemia? No    Current insulin regiment: none Most Recent Eye Exam: none   --------------------------------------------------------------------------------------------------- Hypertension, follow-up  BP Readings from Last 3 Encounters:  01/22/21 130/72  07/11/20 131/85  03/24/20 139/70   Wt Readings from Last 3 Encounters:  04/23/21 199 lb (90.3 kg)  01/22/21 227 lb (103 kg)  07/11/20 223 lb (101.2 kg)     She was last seen for hypertension 3 months ago.  BP at that visit was 130/72. Management since that visit includes no changes. She reports excellent compliance with treatment. She is not having side effects.  She is exercising. She is adherent to low salt diet.   Outside blood pressures are stable.  She does smoke.  Use of agents associated with hypertension: none.   --------------------------------------------------------------------------------------------------- Lipid/Cholesterol, follow-up  Last Lipid Panel: Lab Results  Component Value Date   CHOL 172 01/26/2021   LDLCALC 99 01/26/2021   HDL 40 01/26/2021   TRIG 189 (H) 01/26/2021    She was last seen for this 3 months ago.  Management since that visit includes no changes.  She reports excellent compliance with treatment. She is not having side effects.   Symptoms: No appetite changes No foot ulcerations  No chest pain No chest pressure/discomfort  No dyspnea No orthopnea  No fatigue No lower extremity edema  No palpitations No paroxysmal nocturnal dyspnea  No nausea No numbness or tingling of extremity  No polydipsia No polyuria  No speech difficulty No syncope   She is following a Regular diet. Current exercise: walking  Last metabolic panel Lab Results  Component Value Date   GLUCOSE 159 (H) 01/26/2021   NA 138 01/26/2021  K 3.9 01/26/2021   BUN 14 01/26/2021   CREATININE 0.83 01/26/2021   EGFR 79 01/26/2021   GFRNONAA 90 03/28/2020   CALCIUM 9.3  01/26/2021   AST 15 01/26/2021   ALT 15 01/26/2021   The 10-year ASCVD risk score (Arnett DK, et al., 2019) is: 23.2%  ---------------------------------------------------------------------------------------------------     Medications: Outpatient Medications Prior to Visit  Medication Sig   ARIPiprazole (ABILIFY) 5 MG tablet Take 1 tablet (5 mg total) by mouth daily.   aspirin 81 MG tablet Take 81 mg by mouth every evening.    desvenlafaxine (PRISTIQ) 100 MG 24 hr tablet Take 1 tablet (100 mg total) by mouth daily.   levothyroxine (SYNTHROID) 175 MCG tablet Take 1 tablet (175 mcg total) by mouth daily before breakfast.   lisinopril-hydrochlorothiazide (ZESTORETIC) 20-25 MG tablet Take 1 tablet by mouth daily.   LORazepam (ATIVAN) 1 MG tablet TAKE 1 TABLET BY MOUTH EVERY 8 HOURS AS NEEDED FOR ANXIETY   metoprolol succinate (TOPROL-XL) 25 MG 24 hr tablet Take 1 tablet (25 mg total) by mouth daily.   naproxen sodium (ANAPROX) 220 MG tablet Take 440 mg by mouth daily as needed (pain).   simvastatin (ZOCOR) 40 MG tablet Take 1 tablet (40 mg total) by mouth at bedtime.   Vitamin D, Ergocalciferol, (DRISDOL) 1.25 MG (50000 UNIT) CAPS capsule TAKE 1 CAPSULE BY MOUTH EVERY 7 DAYS   zolpidem (AMBIEN CR) 12.5 MG CR tablet TAKE 1 TABLET BY MOUTH EVERY NIGHT AT BEDTIME AS NEEDED FOR SLEEP.   [DISCONTINUED] norethindrone-ethinyl estradiol (FEMHRT LOW DOSE) 0.5-2.5 MG-MCG tablet TAKE 1 TABLET BY MOUTH ONCE A DAY (Patient not taking: Reported on 04/23/2021)   No facility-administered medications prior to visit.    Review of Systems  Constitutional:  Negative for appetite change and fatigue.  Respiratory:  Negative for chest tightness and shortness of breath.   Cardiovascular:  Negative for chest pain, palpitations and leg swelling.  Endocrine: Negative for cold intolerance and heat intolerance.  Psychiatric/Behavioral:  Negative for agitation and sleep disturbance. The patient is not  nervous/anxious.       Objective    Ht 5' 4.5" (1.638 m)    Wt 199 lb (90.3 kg)    LMP 04/27/2007    BMI 33.63 kg/m  BP Readings from Last 3 Encounters:  01/22/21 130/72  07/11/20 131/85  03/24/20 139/70   Wt Readings from Last 3 Encounters:  04/23/21 199 lb (90.3 kg)  01/22/21 227 lb (103 kg)  07/11/20 223 lb (101.2 kg)        Assessment & Plan     Problem List Items Addressed This Visit       Cardiovascular and Mediastinum   Hypertension associated with diabetes ([place]) - Primary    Chronic, previously stable Patient does not check blood pressure Denies CP, SOB, DOE Continues to smoke Remains on dual agent medication Denies need for refills at this time        Endocrine   Hypothyroidism    Chronic, stable Reports AM dosing, with water, alone Denies any side effects      Hyperlipidemia associated with type 2 diabetes mellitus (HCC)    Chronic, previously stable Self reported weight of 28# which indicated weight loss Pt however does not have access to a scale and this was a guess Continue to recommend diet low in saturated fat and regular exercise - 30 min at least 5 times per week LDL goal of <70      Type 2  diabetes mellitus with stage 2 chronic kidney disease, without long-term current use of insulin ([place])    Discussed addition of Metformin or referral to [facility] lifestyle center Pt refused as she 'does not have DM' Pt educated on based on lab work from 03/2020 to 06/2020 DM markers progressed and there in indicated that she does have T2DM Continue to recommend balanced, lower carb meals. Smaller meal size, adding snacks. Choosing water as drink of choice and increasing purposeful exercise.         Other   Tobacco abuse    Continues to smoke 1 ppd Refused to address smoking cessation at this time      Primary insomnia    Remains on ambien; aware of age limiting factors Does not wish to come off medication at this time      GAD (generalized anxiety  disorder)    Chronic, stable Denies additional stressors during the holidays       No follow-ups on file.    I discussed the assessment and treatment plan with the patient. The patient was provided an opportunity to ask questions and all were answered. The patient agreed with the plan and demonstrated an understanding of the instructions.   The patient was advised to call back or seek an in-person evaluation if the symptoms worsen or if the condition fails to improve as anticipated.  I provided 7 minutes of non-face-to-face time during this encounter.  Vonna Kotyk, FNP, have reviewed all documentation for this visit. The documentation on 04/23/21 for the exam, diagnosis, procedures, and orders are all accurate and complete.   Gwyneth Sprout, [place] [phone] (phone) [phone] (fax)  [place]

## 2021-04-23 NOTE — Assessment & Plan Note (Signed)
Chronic, stable Reports AM dosing, with water, alone Denies any side effects

## 2021-04-27 ENCOUNTER — Other Ambulatory Visit: Payer: Self-pay | Admitting: Family Medicine

## 2021-04-27 DIAGNOSIS — F419 Anxiety disorder, unspecified: Secondary | ICD-10-CM

## 2021-05-21 ENCOUNTER — Other Ambulatory Visit: Payer: Self-pay | Admitting: Family Medicine

## 2021-05-21 DIAGNOSIS — F419 Anxiety disorder, unspecified: Secondary | ICD-10-CM

## 2021-05-21 MED ORDER — LORAZEPAM 0.5 MG PO TABS: 0.5000 mg | ORAL_TABLET | ORAL | 0 refills | Status: DC | PRN

## 2021-06-09 ENCOUNTER — Other Ambulatory Visit: Payer: Self-pay | Admitting: Family Medicine

## 2021-06-09 DIAGNOSIS — F419 Anxiety disorder, unspecified: Secondary | ICD-10-CM

## 2021-06-10 ENCOUNTER — Other Ambulatory Visit: Payer: Self-pay | Admitting: Family Medicine

## 2021-06-10 DIAGNOSIS — F419 Anxiety disorder, unspecified: Secondary | ICD-10-CM

## 2021-06-10 NOTE — Telephone Encounter (Signed)
Requested medications are due for refill today.  early  Requested medications are on the active medications list.  yes  Last refill. 05/21/2021 #60 0 refills  Future visit scheduled.   no  Notes to clinic.  Medication not delegated.    Requested Prescriptions  Pending Prescriptions Disp Refills   LORazepam (ATIVAN) 1 MG tablet [Pharmacy Med Name: LORAZEPAM 1 MG TAB] 60 tablet     Sig: TAKE 1 TABLET BY MOUTH 2 TIMES DAILY AS NEEDED FOR ANXIETY     Not Delegated - Psychiatry: Anxiolytics/Hypnotics 2 Failed - 06/09/2021  4:21 PM      Failed - This refill cannot be delegated      Failed - Urine Drug Screen completed in last 360 days      Passed - Patient is not pregnant      Passed - Valid encounter within last 6 months    Recent Outpatient Visits           1 month ago Hypertension associated with diabetes [facility])   [facility] T[place] Joe T, FNP   4 months ago Hypertension associated with diabetes [facility])   [facility], Dionne Bucy, MD   11 months ago Annual physical exam   [place], [place], [name]   1 year ago Hypothyroidism due to acquired atrophy of thyroid   [place], [name], [name]   1 year ago Primary insomnia   [facility] [place], [place], [name]

## 2021-06-11 ENCOUNTER — Other Ambulatory Visit: Payer: Self-pay | Admitting: Family Medicine

## 2021-06-11 DIAGNOSIS — F419 Anxiety disorder, unspecified: Secondary | ICD-10-CM

## 2021-06-11 NOTE — Telephone Encounter (Signed)
Requested medication (s) are due for refill today: yes due in 10 days   Requested medication (s) are on the active medication list: yes  Last refill:  05/21/21 #30 0 refills  Future visit scheduled: no   Notes to clinic:  not delegated per protocol do you want to refill Rx?     Requested Prescriptions  Pending Prescriptions Disp Refills   LORazepam (ATIVAN) 1 MG tablet [Pharmacy Med Name: LORAZEPAM 1 MG TAB] 60 tablet     Sig: TAKE 1 TABLET BY MOUTH 2 TIMES DAILY AS NEEDED FOR ANXIETY     Not Delegated - Psychiatry: Anxiolytics/Hypnotics 2 Failed - 06/10/2021  3:14 PM      Failed - This refill cannot be delegated      Failed - Urine Drug Screen completed in last 360 days      Passed - Patient is not pregnant      Passed - Valid encounter within last 6 months    Recent Outpatient Visits           1 month ago Hypertension associated with diabetes [facility])   [facility] Tally Joe T, FNP   4 months ago Hypertension associated with diabetes [facility])   [facility], Dionne Bucy, MD   11 months ago Annual physical exam   [place], [place], [name]   1 year ago Hypothyroidism due to acquired atrophy of thyroid   [place], [name], [name]   1 year ago Primary insomnia   [facility] [place], [place], [name]

## 2021-06-12 ENCOUNTER — Other Ambulatory Visit: Payer: Self-pay

## 2021-06-12 ENCOUNTER — Other Ambulatory Visit: Payer: Self-pay | Admitting: Family Medicine

## 2021-06-12 ENCOUNTER — Other Ambulatory Visit: Payer: Self-pay | Admitting: *Deleted

## 2021-06-12 DIAGNOSIS — F419 Anxiety disorder, unspecified: Secondary | ICD-10-CM

## 2021-06-12 NOTE — Telephone Encounter (Signed)
Please resend rx. The previous one printed, instead of going electronically.

## 2021-06-12 NOTE — Telephone Encounter (Signed)
Requested medication (s) are due for refill today:No  Due 06/21/21  Requested medication (s) are on the active medication list: yes    Last refill: 05/21/21 #60  0 refills  Future visit scheduled no  Notes to clinic:Not delegated. Sent via Interface, requested too soon. Cannot refuse non- delegated meds per protocol, please review. Thank you  Requested Prescriptions  Pending Prescriptions Disp Refills   LORazepam (ATIVAN) 1 MG tablet [Pharmacy Med Name: LORAZEPAM 1 MG TAB] 60 tablet     Sig: TAKE 1 TABLET BY MOUTH 2 TIMES DAILY AS NEEDED FOR ANXIETY     Not Delegated - Psychiatry: Anxiolytics/Hypnotics 2 Failed - 06/11/2021  5:29 PM      Failed - This refill cannot be delegated      Failed - Urine Drug Screen completed in last 360 days      Passed - Patient is not pregnant      Passed - Valid encounter within last 6 months    Recent Outpatient Visits           1 month ago Hypertension associated with diabetes [facility])   [facility] Tally Joe T, FNP   4 months ago Hypertension associated with diabetes [facility])   [facility], Dionne Bucy, MD   11 months ago Annual physical exam   [place], [place], [name]   1 year ago Hypothyroidism due to acquired atrophy of thyroid   [place], [name], [name]   1 year ago Primary insomnia   [facility] [place], [place], [name]

## 2021-06-12 NOTE — Telephone Encounter (Signed)
Pt following up on her refill LORazepam (ATIVAN) 1 MG tablet.  Pt told it was sent over to pharmacy. But it was not sent electronically.  it was printed.+ Sig: TAKE 1 TABLET BY MOUTH 2 TIMES DAILY AS NEEDED FOR ANXIETY   Class: Print    Can you resend to  [place], [place]

## 2021-06-15 ENCOUNTER — Telehealth: Payer: Self-pay | Admitting: Family Medicine

## 2021-06-15 DIAGNOSIS — F5101 Primary insomnia: Secondary | ICD-10-CM

## 2021-06-15 DIAGNOSIS — F419 Anxiety disorder, unspecified: Secondary | ICD-10-CM

## 2021-06-15 NOTE — Telephone Encounter (Unsigned)
Copied from [place] [phone]. Topic: Quick Communication - Rx Refill/Question >> Jun 15, 2021  3:00 PM Tessa Lerner A wrote: Medication: LORazepam (ATIVAN) 1 MG tablet [[id number]]  Has the patient contacted their pharmacy? Yes.  The patient has previously contacted them and been told that no prescription was ready  (Agent: If no, request that the patient contact the pharmacy for the refill. If patient does not wish to contact the pharmacy document the reason why and proceed with request.) (Agent: If yes, when and what did the pharmacy advise?)  Preferred Pharmacy (with phone number or street name): [place], [place] [address] [place] [address] Phone: [phone] Fax: [phone]   Has the patient been seen for an appointment in the last year OR does the patient have an upcoming appointment? Yes.    Agent: Please be advised that RX refills may take up to 3 business days. We ask that you follow-up with your pharmacy.

## 2021-06-15 NOTE — Telephone Encounter (Signed)
[facility] called and spoke to [employer], Merchant navy officer about the refill(s) Lorazepam 1 mg requested. Advised it was sent on 06/12/21 #60/2 refill(s). He says there was a power outage most of the day on 06/12/21, so the Rx was not received. He also says there is a manufacturer back order on Zolpidem CR 12.5 mg and asked if the provider wants to consider changing the dosage. He says they have in stock the 6.25 mg, if the provider wants to switch it to taking 2 of those. I advised I will send this to the provider for review.

## 2021-06-16 ENCOUNTER — Other Ambulatory Visit: Payer: Self-pay | Admitting: Family Medicine

## 2021-06-16 MED ORDER — ZOLPIDEM TARTRATE ER 6.25 MG PO TBCR: 12.5000 mg | EXTENDED_RELEASE_TABLET | Freq: Every evening | ORAL | 0 refills | Status: DC | PRN

## 2021-06-16 MED ORDER — LORAZEPAM 0.5 MG PO TABS
0.5000 mg | ORAL_TABLET | Freq: Three times a day (TID) | ORAL | 0 refills | Status: DC | PRN
Start: 2021-06-16 — End: 2021-07-07

## 2021-07-02 ENCOUNTER — Other Ambulatory Visit: Payer: Self-pay | Admitting: Physician Assistant

## 2021-07-02 ENCOUNTER — Other Ambulatory Visit: Payer: Self-pay | Admitting: Family Medicine

## 2021-07-02 DIAGNOSIS — F331 Major depressive disorder, recurrent, moderate: Secondary | ICD-10-CM

## 2021-07-02 NOTE — Telephone Encounter (Signed)
Requested medication (s) are due for refill today: yes ? ?Requested medication (s) are on the active medication list: yes ? ?Last refill:  06/16/21 #60/0 ? ?Future visit scheduled: no ? ?Notes to clinic:  Unable to refill per protocol, cannot delegate. ? ? ? ?  ?Requested Prescriptions  ?Pending Prescriptions Disp Refills  ? zolpidem (AMBIEN CR) 6.25 MG CR tablet [Pharmacy Med Name: ZOLPIDEM TARTRATE ER 6.25 MG TAB] 60 tablet   ?  Sig: TAKE 2 TABLETS (12.'5MG'$ ) BY MOUTH AT BEDTIME AS NEEDED FOR SLEEP  ?  ? Not Delegated - Psychiatry:  Anxiolytics/Hypnotics Failed - 07/02/2021  9:59 AM  ?  ?  Failed - This refill cannot be delegated  ?  ?  Failed - Urine Drug Screen completed in last 360 days  ?  ?  Passed - Valid encounter within last 6 months  ?  Recent Outpatient Visits   ? ?      ? 2 months ago Hypertension associated with diabetes ([place])  ? [facility] Gwyneth Sprout, FNP  ? 5 months ago Hypertension associated with diabetes ([place])  ? [facility] [place], Dionne Bucy, MD  ? 11 months ago Annual physical exam  ? [place], [name]  ? 1 year ago Hypothyroidism due to acquired atrophy of thyroid  ? [place], [name]  ? 1 year ago Primary insomnia  ? [facility] [place], Anderson Malta M, [name]  ? ?  ?  ? ?  ?  ?  ? ?

## 2021-07-06 ENCOUNTER — Other Ambulatory Visit: Payer: Self-pay | Admitting: Family Medicine

## 2021-07-06 ENCOUNTER — Other Ambulatory Visit: Payer: Self-pay | Admitting: Physician Assistant

## 2021-07-06 DIAGNOSIS — F331 Major depressive disorder, recurrent, moderate: Secondary | ICD-10-CM

## 2021-07-07 NOTE — Telephone Encounter (Signed)
Duplicate request ? ?Requested Prescriptions  ?Pending Prescriptions Disp Refills  ?? LORazepam (ATIVAN) 0.5 MG tablet [Pharmacy Med Name: LORAZEPAM 0.5 MG TAB] 30 tablet   ?  Sig: TAKE 1 TABLET (0.'5MG'$ ) BY MOUTH AS NEEDEDFOR ANXIETY (USE IN ADDITION TO YOUR 1 MG TWICE DAILY STRENGTH FOR BREAKTHROUGHIF NEEDED)  ?  ? Not Delegated - Psychiatry: Anxiolytics/Hypnotics 2 Failed - 07/06/2021 11:50 AM  ?  ?  Failed - This refill cannot be delegated  ?  ?  Failed - Urine Drug Screen completed in last 360 days  ?  ?  Passed - Patient is not pregnant  ?  ?  Passed - Valid encounter within last 6 months  ?  Recent Outpatient Visits   ?      ? 2 months ago Hypertension associated with diabetes ([place])  ? [facility] Gwyneth Sprout, FNP  ? 5 months ago Hypertension associated with diabetes ([place])  ? [facility] [place], Dionne Bucy, MD  ? 12 months ago Annual physical exam  ? [place], [name]  ? 1 year ago Hypothyroidism due to acquired atrophy of thyroid  ? [place], [name]  ? 1 year ago Primary insomnia  ? [facility] [place], Anderson Malta M, [name]  ?  ?  ? ?  ?  ?  ? ? ?

## 2021-07-07 NOTE — Telephone Encounter (Signed)
Requested medication (s) are due for refill today: not yet ? ?Requested medication (s) are on the active medication list: yes ? ?Last refill:  06/16/21 #90/0 ? ?Future visit scheduled: no ? ?Notes to clinic:  Unable to refill per protocol, cannot delegate. ? ? ? ?  ?Requested Prescriptions  ?Pending Prescriptions Disp Refills  ? LORazepam (ATIVAN) 0.5 MG tablet [Pharmacy Med Name: LORAZEPAM 0.5 MG TAB] 90 tablet   ?  Sig: TAKE 1 TABLET BY MOUTH 3 TIMES DAILY AS NEEDED FOR ANXIETY  ?  ? Not Delegated - Psychiatry: Anxiolytics/Hypnotics 2 Failed - 07/06/2021 11:49 AM  ?  ?  Failed - This refill cannot be delegated  ?  ?  Failed - Urine Drug Screen completed in last 360 days  ?  ?  Passed - Patient is not pregnant  ?  ?  Passed - Valid encounter within last 6 months  ?  Recent Outpatient Visits   ? ?      ? 2 months ago Hypertension associated with diabetes ([place])  ? [facility] Gwyneth Sprout, FNP  ? 5 months ago Hypertension associated with diabetes ([place])  ? [facility] [place], Dionne Bucy, MD  ? 12 months ago Annual physical exam  ? [place], [name]  ? 1 year ago Hypothyroidism due to acquired atrophy of thyroid  ? [place], [name]  ? 1 year ago Primary insomnia  ? [facility] [place], Anderson Malta M, [name]  ? ?  ?  ? ?  ?  ?  ? ?

## 2021-07-13 NOTE — Telephone Encounter (Signed)
LOV:04/23/2021 ?NOV: none ?

## 2021-07-13 NOTE — Telephone Encounter (Signed)
LOV:04/23/2021 ?CHT:VGVS ?

## 2021-07-15 ENCOUNTER — Other Ambulatory Visit: Payer: Self-pay | Admitting: Family Medicine

## 2021-07-27 ENCOUNTER — Telehealth: Payer: Self-pay | Admitting: Family Medicine

## 2021-07-27 NOTE — Telephone Encounter (Signed)
[name] Pts husband is calling with concerns about the patients taking LORazepam (ATIVAN) 0.5 MG tablet [[id number]] . Husband reports that she just lays in the bed most of the day. He report that the patient has changed - patient is not motivated to wash her hair, take a bath. Pt does not leave the house. ? ?Husband reports that the medication states no refills. But the patient continues to get refills. How is this possible? ? ?Pts husband is concerned wanting some answers? ? ?Mr [phone] ?

## 2021-07-28 NOTE — Progress Notes (Signed)
?  ?[employer] as a Education administrator for Gwyneth Sprout, FNP.,have documented all relevant documentation on the behalf of Gwyneth Sprout, FNP,as directed by  Gwyneth Sprout, FNP while in the presence of Gwyneth Sprout, FNP.  ? ?Established patient visit ? ? ?Patient: Regina Pierce   DOB: [birth year]   66 y.o. Female  MRN: [id number] ?Visit Date: 07/29/2021 ? ?Today's healthcare provider: Gwyneth Sprout, FNP  ? ?Re Introduced to nurse practitioner role and practice setting.  All questions answered.  Discussed provider/patient relationship and expectations. ? ? ?Chief Complaint  ?Patient presents with  ? Anxiety  ? ?Subjective  ?  ?HPI  ?Anxiety, Follow-up ? ?She was last seen for anxiety 3 months ago. ?Changes made at last visit include none, stable. ?  ?She reports excellent compliance with treatment. ?She reports good tolerance of treatment. ?She is not having side effects. ? ?She feels her anxiety is moderate and Unchanged since last visit. ? ?Symptoms: ?No chest pain No difficulty concentrating  ?No dizziness Yes fatigue  ?No feelings of losing control Yes insomnia  ?No irritable No palpitations  ?No panic attacks No racing thoughts  ?No shortness of breath No sweating  ?No tremors/shakes   ? ?GAD-7 Results ? ?  04/23/2021  ?  3:04 PM 02/27/2018  ?  4:47 PM 01/02/2018  ?  2:59 PM  ?GAD-7 Generalized Anxiety Disorder Screening Tool  ?1. Feeling Nervous, Anxious, or on Edge 0 1 3  ?2. Not Being Able to Stop or Control Worrying 0 0 3  ?3. Worrying Too Much About Different Things 0 0 3  ?4. Trouble Relaxing 0 0 3  ?5. Being So Restless it's Hard To Sit Still 0 1 0  ?6. Becoming Easily Annoyed or Irritable 0 0 3  ?7. Feeling Afraid As If Something Awful Might Happen 0 0 0  ?Total GAD-7 Score 0 2 15  ?Difficulty At Work, Home, or Getting  Along With Others? Not difficult at all Not difficult at all Extremely difficult  ? ? ?PHQ-9 Scores ? ?  07/29/2021  ?  2:33 PM 04/23/2021  ?  3:03 PM 01/22/2021  ?  3:40 PM  ?PHQ9 SCORE  ONLY  ?PHQ-9 Total Score 2 0 3  ? ? ?---------------------------------------------------------------------------------------------------  ? ?Medications: ?Outpatient Medications Prior to Visit  ?Medication Sig  ? ARIPiprazole (ABILIFY) 5 MG tablet TAKE HALF A TABLET BY MOUTH DAILY  ? aspirin 81 MG tablet Take 81 mg by mouth every evening.   ? desvenlafaxine (PRISTIQ) 100 MG 24 hr tablet TAKE 1 TABLET BY MOUTH ONCE A DAY  ? levothyroxine (SYNTHROID) 175 MCG tablet Take 1 tablet (175 mcg total) by mouth daily before breakfast.  ? lisinopril-hydrochlorothiazide (ZESTORETIC) 20-25 MG tablet Take 1 tablet by mouth daily.  ? metoprolol succinate (TOPROL-XL) 25 MG 24 hr tablet Take 1 tablet (25 mg total) by mouth daily.  ? naproxen sodium (ANAPROX) 220 MG tablet Take 440 mg by mouth daily as needed (pain).  ? simvastatin (ZOCOR) 40 MG tablet Take 1 tablet (40 mg total) by mouth at bedtime.  ? Vitamin D, Ergocalciferol, (DRISDOL) 1.25 MG (50000 UNIT) CAPS capsule TAKE 1 CAPSULE BY MOUTH EVERY 7 DAYS  ? [DISCONTINUED] LORazepam (ATIVAN) 0.5 MG tablet TAKE 1 TABLET BY MOUTH 3 TIMES DAILY AS NEEDED FOR ANXIETY  ? [DISCONTINUED] zolpidem (AMBIEN CR) 6.25 MG CR tablet TAKE 2 TABLETS (12.'5MG'$ ) BY MOUTH AT BEDTIME AS NEEDED FOR SLEEP  ? ?No facility-administered medications prior to  visit.  ? ? ?Review of Systems ? ? ?  Objective  ?  ?BP 132/69   Pulse 88   Temp (!) 96.4 ?F (35.8 ?C) (Temporal)   Resp 16   Wt 207 lb 6.4 oz (94.1 kg)   LMP 04/27/2007   BMI 35.05 kg/m?  ? ? ?Physical Exam ?Vitals and nursing note reviewed.  ?Constitutional:   ?   General: She is not in acute distress. ?   Appearance: Normal appearance. She is obese. She is not ill-appearing, toxic-appearing or diaphoretic.  ?HENT:  ?   Head: Normocephalic and atraumatic.  ?   Right Ear: Decreased hearing noted.  ?   Left Ear: Decreased hearing noted.  ?Cardiovascular:  ?   Rate and Rhythm: Normal rate and regular rhythm.  ?   Pulses: Normal pulses.  ?   Heart  sounds: Normal heart sounds. No murmur heard. ?  No friction rub. No gallop.  ?Pulmonary:  ?   Effort: Pulmonary effort is normal. No respiratory distress.  ?   Breath sounds: Normal breath sounds. No stridor. No wheezing, rhonchi or rales.  ?Chest:  ?   Chest wall: No tenderness.  ?Abdominal:  ?   General: Bowel sounds are normal.  ?   Palpations: Abdomen is soft.  ?Musculoskeletal:     ?   General: No swelling, tenderness, deformity or signs of injury. Normal range of motion.  ?   Right lower leg: No edema.  ?   Left lower leg: No edema.  ?Skin: ?   General: Skin is warm and dry.  ?   Capillary Refill: Capillary refill takes less than 2 seconds.  ?   Coloration: Skin is not jaundiced or pale.  ?   Findings: No bruising, erythema, lesion or rash.  ?Neurological:  ?   General: No focal deficit present.  ?   Mental Status: She is alert and oriented to person, place, and time. Mental status is at baseline.  ?   Cranial Nerves: No cranial nerve deficit.  ?   Sensory: No sensory deficit.  ?   Motor: No weakness.  ?   Coordination: Coordination normal.  ?Psychiatric:     ?   Mood and Affect: Mood is depressed. Affect is blunt and flat.     ?   Speech: Speech is delayed.     ?   Behavior: Behavior normal.     ?   Thought Content: Thought content normal. Thought content does not include homicidal or suicidal ideation. Thought content does not include homicidal or suicidal plan.     ?   Judgment: Judgment normal.  ?  ? ?No results found for any visits on 07/29/21. ? Assessment & Plan  ?  ? ?Problem List Items Addressed This Visit   ? ?  ? Cardiovascular and Mediastinum  ? Hypertension associated with diabetes ([place])  ?  Chronic, stable ?Denies CP ?Denies SOB/ DOE ?Denies low blood pressure/hypotension ?Denies vision changes ?No LE Edema noted on exam ?Continue medication, lisinopril-hctz 20/25 and toprol 25 ?Denies side effects  ?Seek emergent care if you develop chest pain or chest pressure ? ?  ?  ?  ? Endocrine  ?  Hypothyroidism  ?  Chronic, previous stable on 175 mcg ?Reports worsening fatigue and depression ?Repeat TSH and Free T4 ?Titrate Rx based on labs ?  ?  ? Relevant Orders  ? TSH + free T4  ? Type 2 diabetes mellitus with hyperglycemia, without long-term current use  of insulin ([place])  ?  Chronic, previously stable with diet and exercise ?Repeat a1c ?Recommend use of metformin 750 mg xr if elevated ?rtc in 3-6 months based on a1c ?  ?  ? Relevant Orders  ? Comprehensive Metabolic Panel (CMET)  ? Hemoglobin A1c  ? Ambulatory referral to Ophthalmology  ?  ? Nervous and Auditory  ? Bilateral hearing loss  ?  Chronic, worsening ?Will sent to ent for hearing eval ?  ?  ? Relevant Orders  ? Ambulatory referral to ENT  ?  ? Other  ? Depression, recurrent ([place]) - Primary  ?  Chronic, worsening per conversation with daughter and message from husband ?Has been previously on 1/2 tab of abilify 5 mg and pristiq 100 mg  ?Will refer to psych for further assistance ?Recommend coming off of ambien at this time with use of trazodone to assist if needed ?  ?  ? Relevant Medications  ? traZODone (DESYREL) 50 MG tablet  ? LORazepam (ATIVAN) 0.5 MG tablet  ? Other Relevant Orders  ? Ambulatory referral to Psychiatry  ? Encounter for screening mammogram for malignant neoplasm of breast  ?  Due for screening for mammogram, denies breast concerns, provided with phone number to call and schedule appointment for mammogram. Encouraged to repeat breast cancer screening every 1-2 years. ? ?  ?  ? Relevant Orders  ? MM 3D SCREEN BREAST BILATERAL  ? GAD (generalized anxiety disorder)  ?  Chronic, stable per GAD7; however, appears to be worsening with  ?Pt with misuse of PRN benzo ?4 tablets remain of the 90 tablets provided in 3/16 ?daughter accompanies pt today with request to wean pt off medication d/t misuse and poor self care and care of the home and participation in anything other than laying in bed or smoking ?Pt in agreement ?Spoke with  pharmacy to confirm this is an early fill that requires self pay d/t inability to bill insurance; advised of pill count and pt misuse  ?  ?  ? Relevant Medications  ? traZODone (DESYREL) 50 MG tablet  ? LORazepam (ATIVAN)

## 2021-07-28 NOTE — Telephone Encounter (Signed)
Patients spouse has been advised, he request that we contact patient and arrange appt. Reached out to patient and left voice message to call back, when patient returns call [facility] nurse triage please schedule follow up for patient to see PCP as soon as possible. KW ?

## 2021-07-29 ENCOUNTER — Encounter: Payer: Self-pay | Admitting: Family Medicine

## 2021-07-29 ENCOUNTER — Ambulatory Visit (INDEPENDENT_AMBULATORY_CARE_PROVIDER_SITE_OTHER): Payer: Self-pay | Admitting: Family Medicine

## 2021-07-29 VITALS — BP 132/69 | HR 88 | Temp 96.4°F | Resp 16 | Wt 207.4 lb

## 2021-07-29 DIAGNOSIS — E039 Hypothyroidism, unspecified: Secondary | ICD-10-CM

## 2021-07-29 DIAGNOSIS — E1165 Type 2 diabetes mellitus with hyperglycemia: Secondary | ICD-10-CM | POA: Insufficient documentation

## 2021-07-29 DIAGNOSIS — Z1231 Encounter for screening mammogram for malignant neoplasm of breast: Secondary | ICD-10-CM | POA: Insufficient documentation

## 2021-07-29 DIAGNOSIS — I152 Hypertension secondary to endocrine disorders: Secondary | ICD-10-CM

## 2021-07-29 DIAGNOSIS — F411 Generalized anxiety disorder: Secondary | ICD-10-CM

## 2021-07-29 DIAGNOSIS — E1159 Type 2 diabetes mellitus with other circulatory complications: Secondary | ICD-10-CM

## 2021-07-29 DIAGNOSIS — F339 Major depressive disorder, recurrent, unspecified: Secondary | ICD-10-CM

## 2021-07-29 DIAGNOSIS — Z1211 Encounter for screening for malignant neoplasm of colon: Secondary | ICD-10-CM | POA: Insufficient documentation

## 2021-07-29 DIAGNOSIS — H9193 Unspecified hearing loss, bilateral: Secondary | ICD-10-CM | POA: Insufficient documentation

## 2021-07-29 MED ORDER — LORAZEPAM 0.5 MG PO TABS
ORAL_TABLET | ORAL | 0 refills | Status: DC
Start: 2021-07-29 — End: 2021-09-02

## 2021-07-29 MED ORDER — TRAZODONE HCL 50 MG PO TABS: 50.0000 mg | ORAL_TABLET | Freq: Every evening | ORAL | 0 refills | Status: DC | PRN

## 2021-07-29 NOTE — Assessment & Plan Note (Signed)
Chronic, stable ?Denies CP ?Denies SOB/ DOE ?Denies low blood pressure/hypotension ?Denies vision changes ?No LE Edema noted on exam ?Continue medication, lisinopril-hctz 20/25 and toprol 25 ?Denies side effects  ?Seek emergent care if you develop chest pain or chest pressure ? ?

## 2021-07-29 NOTE — Assessment & Plan Note (Signed)
Chronic, previous stable on 175 mcg ?Reports worsening fatigue and depression ?Repeat TSH and Free T4 ?Titrate Rx based on labs ?

## 2021-07-29 NOTE — Patient Instructions (Signed)
The 10-year ASCVD risk score (Arnett DK, et al., 2019) is: 23.8% ?  Values used to calculate the score: ?    Age: 66 years ?    Sex: Female ?    Is Non-Hispanic African American: No ?    Diabetic: Yes ?    Tobacco smoker: Yes ?    Systolic Blood Pressure: 948 mmHg ?    Is BP treated: Yes ?    HDL Cholesterol: 40 mg/dL ?    Total Cholesterol: 172 mg/dL ? ?

## 2021-07-29 NOTE — Assessment & Plan Note (Signed)
Chronic, worsening ?Will sent to ent for hearing eval ?

## 2021-07-29 NOTE — Assessment & Plan Note (Signed)
Chronic, worsening per conversation with daughter and message from husband ?Has been previously on 1/2 tab of abilify 5 mg and pristiq 100 mg  ?Will refer to psych for further assistance ?Recommend coming off of ambien at this time with use of trazodone to assist if needed ?

## 2021-07-29 NOTE — Assessment & Plan Note (Signed)
Due for repeat colon cancer screening ?Will refer back to gi > 5 years was due last month ?Pt denies complaints at this time ?

## 2021-07-29 NOTE — Assessment & Plan Note (Signed)
Chronic, previously stable with diet and exercise ?Repeat a1c ?Recommend use of metformin 750 mg xr if elevated ?rtc in 3-6 months based on a1c ?

## 2021-07-29 NOTE — Assessment & Plan Note (Signed)
Due for screening for mammogram, denies breast concerns, provided with phone number to call and schedule appointment for mammogram. Encouraged to repeat breast cancer screening every 1-2 years.  

## 2021-07-29 NOTE — Assessment & Plan Note (Signed)
Chronic, stable per GAD7; however, appears to be worsening with  ?Pt with misuse of PRN benzo ?4 tablets remain of the 90 tablets provided in 3/16 ?daughter accompanies pt today with request to wean pt off medication d/t misuse and poor self care and care of the home and participation in anything other than laying in bed or smoking ?Pt in agreement ?Spoke with pharmacy to confirm this is an early fill that requires self pay d/t inability to bill insurance; advised of pill count and pt misuse  ?

## 2021-07-30 ENCOUNTER — Telehealth: Payer: Self-pay

## 2021-07-30 ENCOUNTER — Encounter: Payer: Self-pay | Admitting: Family Medicine

## 2021-07-30 ENCOUNTER — Other Ambulatory Visit: Payer: Self-pay | Admitting: Family Medicine

## 2021-07-30 DIAGNOSIS — E039 Hypothyroidism, unspecified: Secondary | ICD-10-CM

## 2021-07-30 DIAGNOSIS — E1165 Type 2 diabetes mellitus with hyperglycemia: Secondary | ICD-10-CM

## 2021-07-30 LAB — COMPREHENSIVE METABOLIC PANEL
ALT: 23 IU/L (ref 0–32)
AST: 20 IU/L (ref 0–40)
Albumin/Globulin Ratio: 2 (ref 1.2–2.2)
Albumin: 4.3 g/dL (ref 3.8–4.8)
Alkaline Phosphatase: 84 IU/L (ref 44–121)
BUN/Creatinine Ratio: 14 (ref 12–28)
BUN: 12 mg/dL (ref 8–27)
Bilirubin Total: 0.6 mg/dL (ref 0.0–1.2)
CO2: 25 mmol/L (ref 20–29)
Calcium: 9.6 mg/dL (ref 8.7–10.3)
Chloride: 98 mmol/L (ref 96–106)
Creatinine, Ser: 0.87 mg/dL (ref 0.57–1.00)
Globulin, Total: 2.1 g/dL (ref 1.5–4.5)
Glucose: 285 mg/dL — ABNORMAL HIGH (ref 70–99)
Potassium: 4.4 mmol/L (ref 3.5–5.2)
Sodium: 138 mmol/L (ref 134–144)
Total Protein: 6.4 g/dL (ref 6.0–8.5)
eGFR: 75 mL/min/{1.73_m2} (ref >59)

## 2021-07-30 LAB — TSH+FREE T4
Free T4: 3.14 ng/dL — ABNORMAL HIGH (ref 0.82–1.77)
TSH: 0.03 u[IU]/mL — ABNORMAL LOW (ref 0.450–4.500)

## 2021-07-30 LAB — HEMOGLOBIN A1C
Est. average glucose Bld gHb Est-mCnc: 223 mg/dL
Hgb A1c MFr Bld: 9.4 % — ABNORMAL HIGH (ref 4.8–5.6)

## 2021-07-30 MED ORDER — METFORMIN HCL ER 750 MG PO TB24
750.0000 mg | ORAL_TABLET | Freq: Two times a day (BID) | ORAL | 1 refills | Status: DC
Start: 2021-07-30 — End: 2022-02-01

## 2021-07-30 MED ORDER — GLIPIZIDE 10 MG PO TABS: 10.0000 mg | ORAL_TABLET | Freq: Two times a day (BID) | ORAL | 1 refills | Status: DC

## 2021-07-30 MED ORDER — LEVOTHYROXINE SODIUM 125 MCG PO TABS: 125.0000 ug | ORAL_TABLET | Freq: Every day | ORAL | 0 refills | Status: DC

## 2021-07-30 NOTE — Telephone Encounter (Signed)
LVM for pt to call back in regards to referral.  Last colonoscopy performed by Dr. Vicente Males 2018 hx of Colon Polyps. ?

## 2021-07-31 ENCOUNTER — Other Ambulatory Visit: Payer: Self-pay | Admitting: Family Medicine

## 2021-07-31 ENCOUNTER — Telehealth: Payer: Self-pay

## 2021-07-31 NOTE — Telephone Encounter (Signed)
Contacted pharmacy on 07/06/21 prescription sent in for 60 days, patient was given 30 with 1 refill. She states that patient has not picked up refill yet and I advised to cancel refill for Ambien. KW ?

## 2021-07-31 NOTE — Telephone Encounter (Signed)
-----   Message from Gwyneth Sprout, FNP sent at 07/31/2021  3:47 PM EDT ----- ?Please call [place] and stop refills on ambien.  ? ?

## 2021-08-04 ENCOUNTER — Other Ambulatory Visit: Payer: Self-pay

## 2021-08-04 DIAGNOSIS — Z86010 Personal history of colonic polyps: Secondary | ICD-10-CM

## 2021-08-04 MED ORDER — NA SULFATE-K SULFATE-MG SULF 17.5-3.13-1.6 GM/177ML PO SOLN: 1.0000 | Freq: Once | ORAL | 0 refills | Status: AC

## 2021-08-04 NOTE — Progress Notes (Signed)
Gastroenterology Pre-Procedure Review ?Regina Pierce patients daughter has permision to schedule. ?Request Date: 08/17/21 ?Requesting Physician: Dr. Vicente Males ? ?PATIENT REVIEW QUESTIONS: The patient responded to the following health history questions as indicated:   ? ?1. Are you having any GI issues? no ?2. Do you have a personal history of Polyps? yes (06/2016 colonoscopy performed by dr. Vicente Males) ?3. Do you have a family history of Colon Cancer or Polyps? no ?4. Diabetes Mellitus? yes (type 2) ?5. Joint replacements in the past 12 months?no ?6. Major health problems in the past 3 months?no ?7. Any artificial heart valves, MVP, or defibrillator?no ?   ?MEDICATIONS & ALLERGIES:    ?Patient reports the following regarding taking any anticoagulation/antiplatelet therapy:   ?Plavix, Coumadin, Eliquis, Xarelto, Lovenox, Pradaxa, Brilinta, or Effient? no ?Aspirin? '81mg'$  ? ?Patient confirms/reports the following medications:  ?Current Outpatient Medications  ?Medication Sig Dispense Refill  ? ARIPiprazole (ABILIFY) 5 MG tablet TAKE HALF A TABLET BY MOUTH DAILY 90 tablet 1  ? aspirin 81 MG tablet Take 81 mg by mouth every evening.     ? desvenlafaxine (PRISTIQ) 100 MG 24 hr tablet TAKE 1 TABLET BY MOUTH ONCE A DAY 90 tablet 1  ? glipiZIDE (GLUCOTROL) 10 MG tablet Take 1 tablet (10 mg total) by mouth 2 (two) times daily before a meal. 180 tablet 1  ? levothyroxine (SYNTHROID) 125 MCG tablet Take 1 tablet (125 mcg total) by mouth daily. Repeat labs prior to refill of this medication 90 tablet 0  ? lisinopril-hydrochlorothiazide (ZESTORETIC) 20-25 MG tablet Take 1 tablet by mouth daily. 90 tablet 3  ? LORazepam (ATIVAN) 0.5 MG tablet For use as a taper. Take 1 tablet, by mouth, 3 times per day for 3 days. Take 1 tablet, by mouth, 2 times per day for 3 days. Take 1 tablet, by mouth, daily for 3 days. 18 tablet 0  ? metFORMIN (GLUCOPHAGE-XR) 750 MG 24 hr tablet Take 1 tablet (750 mg total) by mouth 2 (two) times daily with breakfast and  lunch. 180 tablet 1  ? metoprolol succinate (TOPROL-XL) 25 MG 24 hr tablet Take 1 tablet (25 mg total) by mouth daily. 90 tablet 3  ? naproxen sodium (ANAPROX) 220 MG tablet Take 440 mg by mouth daily as needed (pain).    ? simvastatin (ZOCOR) 40 MG tablet Take 1 tablet (40 mg total) by mouth at bedtime. 90 tablet 3  ? traZODone (DESYREL) 50 MG tablet Take 1 tablet (50 mg total) by mouth at bedtime as needed for sleep. 30 tablet 0  ? Vitamin D, Ergocalciferol, (DRISDOL) 1.25 MG (50000 UNIT) CAPS capsule TAKE 1 CAPSULE BY MOUTH EVERY 7 DAYS 6 capsule 0  ? ?No current facility-administered medications for this visit.  ? ? ?Patient confirms/reports the following allergies:  ?No Known Allergies ? ?No orders of the defined types were placed in this encounter. ? ? ?AUTHORIZATION INFORMATION ?Primary Insurance: ?1D#: ?Group #: ? ?Secondary Insurance: ?1D#: ?Group #: ? ?SCHEDULE INFORMATION: ?Date:  ?Time: ?Location:  ?

## 2021-08-05 ENCOUNTER — Telehealth: Payer: Self-pay

## 2021-08-05 ENCOUNTER — Other Ambulatory Visit: Payer: Self-pay | Admitting: Family Medicine

## 2021-08-05 DIAGNOSIS — E559 Vitamin D deficiency, unspecified: Secondary | ICD-10-CM

## 2021-08-05 NOTE — Telephone Encounter (Signed)
CALLED PATIENT NO ANSWER LEFT VOICEMAIL FOR A CALL BACK °Letter sent °

## 2021-08-06 NOTE — Telephone Encounter (Signed)
Requested medications are due for refill today.  Provider to determine. ? ?Requested medications are on the active medications list.  yes ? ?Last refill. 04/06/2021 #6 0 refills ? ?Future visit scheduled.   yes ? ?Notes to clinic.  Medication not delegated. ? ? ? ?Requested Prescriptions  ?Pending Prescriptions Disp Refills  ? Vitamin D, Ergocalciferol, (DRISDOL) 1.25 MG (50000 UNIT) CAPS capsule [Pharmacy Med Name: VITAMIN D (ERGOCALCIFEROL) 1.25 MG] 6 capsule 0  ?  Sig: TAKE 1 CAPSULE BY MOUTH EVERY 7 DAYS APPT NEEDED FOR FURTHER REFILLS.  ?  ? Endocrinology:  Vitamins - Vitamin D Supplementation 2 Failed - 08/05/2021  4:04 PM  ?  ?  Failed - Manual Review: Route requests for 50,000 IU strength to the provider  ?  ?  Failed - Vitamin D in normal range and within 360 days  ?  Vit D, 25-Hydroxy  ?Date Value Ref Range Status  ?09/07/2019 34.4 30.0 - 100.0 ng/mL Final  ?  Comment:  ?  Vitamin D deficiency has been defined by the Institute of ?Medicine and an Endocrine Society practice guideline as a ?level of serum 25-OH vitamin D less than 20 ng/mL (1,2). ?The Endocrine Society went on to further define vitamin D ?insufficiency as a level between 21 and 29 ng/mL (2). ?1. IOM (Institute of Medicine). 2010. Dietary reference ?   intakes for calcium and D. [place]: The ?   [employer]. ?2. Holick MF, [place], Bischoff-Ferrari HA, et al. ?   Evaluation, treatment, and prevention of vitamin D ?   deficiency: an Endocrine Society clinical practice ?   guideline. JCEM. 2011 Jul; 96(7):1911-30. ?  ?  ?  ?  ?  Passed - Ca in normal range and within 360 days  ?  Calcium  ?Date Value Ref Range Status  ?07/29/2021 9.6 8.7 - 10.3 mg/dL Final  ?  ?  ?  ?  Passed - Valid encounter within last 12 months  ?  Recent Outpatient Visits   ? ?      ? 1 week ago Depression, recurrent ([place])  ? [facility] Tally Joe T, FNP  ? 3 months ago Hypertension associated with diabetes [facility])  ? [facility] Tally Joe T, FNP  ? 6 months ago Hypertension associated with diabetes [facility])  ? [facility] [place], Dionne Bucy, MD  ? 1 year ago Annual physical exam  ? [facility] [place], [name], [name]  ? 1 year ago Hypothyroidism due to acquired atrophy of thyroid  ? [place], [name]  ? ?  ?  ?Future Appointments   ? ?        ? In 3 weeks Gwyneth Sprout, [place], [facility]  ? ?  ? ?  ?  ?  ?  ?

## 2021-08-11 ENCOUNTER — Encounter: Payer: Self-pay | Admitting: Gastroenterology

## 2021-08-11 ENCOUNTER — Other Ambulatory Visit: Payer: Self-pay

## 2021-08-11 ENCOUNTER — Other Ambulatory Visit: Payer: Self-pay | Admitting: Family Medicine

## 2021-08-11 MED ORDER — VITAMIN D3 50 MCG (2000 UT) PO CAPS: 2000.0000 [IU] | ORAL_CAPSULE | Freq: Every day | ORAL | 3 refills | Status: DC

## 2021-08-11 MED ORDER — SIMVASTATIN 40 MG PO TABS: 40.0000 mg | ORAL_TABLET | Freq: Every day | ORAL | 3 refills | Status: DC

## 2021-08-13 ENCOUNTER — Telehealth: Payer: Self-pay | Admitting: Gastroenterology

## 2021-08-13 NOTE — Telephone Encounter (Signed)
Daughter stephanie calls, pt has procedure scheduled for Monday, the prep kit ordered needs prior auth, can you do this or call in another prep kit? ?[place] ? ?

## 2021-08-13 NOTE — Telephone Encounter (Signed)
Daughter calls regarding insurance attached to acct, per daughter pt has Friday insurance.  I have tried to verify several times via RTE and this has responded with pt being inactive at this time, attempted to call stephanie back to advise, no answer-left VM ?

## 2021-08-13 NOTE — Telephone Encounter (Signed)
Vm left by a family member of the patient requesting Dr Vicente Males send in the [place] for colonoscopy on 08/17/2021. ?

## 2021-08-17 ENCOUNTER — Encounter: Admission: RE | Payer: Self-pay | Source: Home / Self Care

## 2021-08-17 ENCOUNTER — Ambulatory Visit: Admission: RE | Admit: 2021-08-17 | Payer: Self-pay | Source: Home / Self Care | Admitting: Gastroenterology

## 2021-08-17 SURGERY — COLONOSCOPY WITH PROPOFOL
Anesthesia: General

## 2021-08-18 NOTE — Telephone Encounter (Signed)
Patient had cancelled her procedure and she will call back when ready to reschedule. ?

## 2021-08-20 ENCOUNTER — Encounter: Payer: Self-pay | Admitting: Obstetrics and Gynecology

## 2021-08-21 ENCOUNTER — Telehealth: Payer: Self-pay | Admitting: Gastroenterology

## 2021-08-21 ENCOUNTER — Other Ambulatory Visit: Payer: Self-pay

## 2021-08-21 DIAGNOSIS — Z86010 Personal history of colonic polyps: Secondary | ICD-10-CM

## 2021-08-21 MED ORDER — NA SULFATE-K SULFATE-MG SULF 17.5-3.13-1.6 GM/177ML PO SOLN: 1.0000 | Freq: Once | ORAL | 0 refills | Status: AC

## 2021-08-21 NOTE — Telephone Encounter (Signed)
Pt called  left message  to reschedule colonoscopy . ?

## 2021-09-02 ENCOUNTER — Ambulatory Visit (INDEPENDENT_AMBULATORY_CARE_PROVIDER_SITE_OTHER): Payer: 59 | Admitting: Family Medicine

## 2021-09-02 ENCOUNTER — Encounter: Payer: Self-pay | Admitting: Family Medicine

## 2021-09-02 VITALS — BP 100/66 | HR 71 | Temp 98.3°F | Resp 14 | Wt 200.0 lb

## 2021-09-02 DIAGNOSIS — F339 Major depressive disorder, recurrent, unspecified: Secondary | ICD-10-CM | POA: Diagnosis not present

## 2021-09-02 DIAGNOSIS — E1159 Type 2 diabetes mellitus with other circulatory complications: Secondary | ICD-10-CM | POA: Diagnosis not present

## 2021-09-02 DIAGNOSIS — I152 Hypertension secondary to endocrine disorders: Secondary | ICD-10-CM | POA: Diagnosis not present

## 2021-09-02 DIAGNOSIS — F411 Generalized anxiety disorder: Secondary | ICD-10-CM | POA: Diagnosis not present

## 2021-09-02 DIAGNOSIS — R69 Illness, unspecified: Secondary | ICD-10-CM | POA: Diagnosis not present

## 2021-09-02 DIAGNOSIS — E1165 Type 2 diabetes mellitus with hyperglycemia: Secondary | ICD-10-CM

## 2021-09-02 MED ORDER — LISINOPRIL-HYDROCHLOROTHIAZIDE 20-25 MG PO TABS: 1.0000 | ORAL_TABLET | Freq: Every day | ORAL | 3 refills | Status: DC

## 2021-09-02 NOTE — Progress Notes (Signed)
?  ? ?I,Roshena L Chambers,acting as a scribe for Gwyneth Sprout, FNP.,have documented all relevant documentation on the behalf of Gwyneth Sprout, FNP,as directed by  Gwyneth Sprout, FNP while in the presence of Gwyneth Sprout, FNP.  ? ?Established patient visit ? ? ?Patient: Regina Pierce   DOB: 11/04/1955   66 y.o. Female  MRN: [id number] ?Visit Date: 09/02/2021 ? ?Today's healthcare provider: Gwyneth Sprout, FNP  ?Re Introduced to nurse practitioner role and practice setting.  All questions answered.  Discussed provider/patient relationship and expectations. ? ? ?Chief Complaint  ?Patient presents with  ? Depression  ? ?Subjective  ?  ?HPI  ?Depression, Follow-up ? ?She  was last seen for this on 07/29/2021.   ?From that visit: will refer to psych for further assistance. ?Recommend coming off of ambien at this time with use of trazodone to assist if needed. ?  ?She reports good compliance with treatment. ?She is not having side effects.  ? ?She reports good tolerance of treatment. ?Current symptoms include:  anxiety ?She feels she is Unchanged since last visit. ? ? ?  09/02/2021  ?  3:33 PM 07/29/2021  ?  2:33 PM 04/23/2021  ?  3:03 PM  ?Depression screen PHQ 2/9  ?Decreased Interest 0 0 0  ?Down, Depressed, Hopeless 0 0 0  ?PHQ - 2 Score 0 0 0  ?Altered sleeping 1 1 0  ?Tired, decreased energy 1 1 0  ?Change in appetite 1 0 0  ?Feeling bad or failure about yourself  0 0 0  ?Trouble concentrating 0 0 0  ?Moving slowly or fidgety/restless 0 0 0  ?Suicidal thoughts 0 0 0  ?PHQ-9 Score 3 2 0  ?Difficult doing work/chores Not difficult at all Not difficult at all Not difficult at all  ?  ?-----------------------------------------------------------------------------------------  ? ?Medications: ?Outpatient Medications Prior to Visit  ?Medication Sig  ? aspirin 81 MG tablet Take 81 mg by mouth every evening.   ? Cholecalciferol (VITAMIN D3) 50 MCG (2000 UT) capsule Take 1 capsule (2,000 Units total) by mouth daily. (Patient not  taking: Reported on 09/02/2021)  ? desvenlafaxine (PRISTIQ) 100 MG 24 hr tablet TAKE 1 TABLET BY MOUTH ONCE A DAY  ? glipiZIDE (GLUCOTROL) 10 MG tablet Take 1 tablet (10 mg total) by mouth 2 (two) times daily before a meal.  ? levothyroxine (SYNTHROID) 125 MCG tablet Take 1 tablet (125 mcg total) by mouth daily. Repeat labs prior to refill of this medication  ? metFORMIN (GLUCOPHAGE-XR) 750 MG 24 hr tablet Take 1 tablet (750 mg total) by mouth 2 (two) times daily with breakfast and lunch.  ? metoprolol succinate (TOPROL-XL) 25 MG 24 hr tablet Take 1 tablet (25 mg total) by mouth daily.  ? naproxen sodium (ANAPROX) 220 MG tablet Take 440 mg by mouth daily as needed (pain).  ? simvastatin (ZOCOR) 40 MG tablet Take 1 tablet (40 mg total) by mouth at bedtime.  ? traZODone (DESYREL) 50 MG tablet Take 1 tablet (50 mg total) by mouth at bedtime as needed for sleep. (Patient taking differently: Take 25 mg by mouth every other day.)  ? Vitamin D, Ergocalciferol, (DRISDOL) 1.25 MG (50000 UNIT) CAPS capsule TAKE 1 CAPSULE BY MOUTH EVERY 7 DAYS  ? [DISCONTINUED] ARIPiprazole (ABILIFY) 5 MG tablet TAKE HALF A TABLET BY MOUTH DAILY  ? [DISCONTINUED] lisinopril-hydrochlorothiazide (ZESTORETIC) 20-25 MG tablet Take 1 tablet by mouth daily.  ? [DISCONTINUED] LORazepam (ATIVAN) 0.5 MG tablet For use as a  taper. Take 1 tablet, by mouth, 3 times per day for 3 days. Take 1 tablet, by mouth, 2 times per day for 3 days. Take 1 tablet, by mouth, daily for 3 days. (Patient not taking: Reported on 09/02/2021)  ? ?No facility-administered medications prior to visit.  ? ? ?Review of Systems  ?Constitutional:  Negative for appetite change, chills, fatigue and fever.  ?Respiratory:  Negative for chest tightness and shortness of breath.   ?Cardiovascular:  Negative for chest pain and palpitations.  ?Gastrointestinal:  Negative for abdominal pain, nausea and vomiting.  ?Neurological:  Negative for dizziness and weakness.  ?Psychiatric/Behavioral:   Positive for confusion. The patient is nervous/anxious.   ? ? ?  Objective  ?  ?BP 100/66 (BP Location: Left Arm, Patient Position: Sitting, Cuff Size: Large)   Pulse 71   Temp 98.3 ?F (36.8 ?C) (Oral)   Resp 14   Wt 200 lb (90.7 kg)   LMP 04/27/2007   BMI 33.80 kg/m?  ? ? ?Physical Exam ?Vitals and nursing note reviewed.  ?Constitutional:   ?   General: She is not in acute distress. ?   Appearance: Normal appearance. She is obese. She is not ill-appearing, toxic-appearing or diaphoretic.  ?HENT:  ?   Head: Normocephalic and atraumatic.  ?Cardiovascular:  ?   Rate and Rhythm: Normal rate and regular rhythm.  ?   Pulses: Normal pulses.  ?   Heart sounds: Normal heart sounds. No murmur heard. ?  No friction rub. No gallop.  ?Pulmonary:  ?   Effort: Pulmonary effort is normal. No respiratory distress.  ?   Breath sounds: Normal breath sounds. No stridor. No wheezing, rhonchi or rales.  ?Chest:  ?   Chest wall: No tenderness.  ?Abdominal:  ?   General: Bowel sounds are normal.  ?   Palpations: Abdomen is soft.  ?Musculoskeletal:     ?   General: No swelling, tenderness, deformity or signs of injury. Normal range of motion.  ?   Right lower leg: No edema.  ?   Left lower leg: No edema.  ?Skin: ?   General: Skin is warm and dry.  ?   Capillary Refill: Capillary refill takes less than 2 seconds.  ?   Coloration: Skin is not jaundiced or pale.  ?   Findings: No bruising, erythema, lesion or rash.  ?Neurological:  ?   General: No focal deficit present.  ?   Mental Status: She is alert and oriented to person, place, and time. Mental status is at baseline.  ?   Cranial Nerves: No cranial nerve deficit.  ?   Sensory: No sensory deficit.  ?   Motor: No weakness.  ?   Coordination: Coordination normal.  ?Psychiatric:     ?   Attention and Perception: Attention normal.     ?   Mood and Affect: Mood is depressed. Affect is flat.     ?   Speech: Speech is delayed.     ?   Behavior: Behavior is slowed and withdrawn. Behavior is  cooperative.     ?   Thought Content: Thought content normal.     ?   Cognition and Memory: Memory is impaired.     ?   Judgment: Judgment normal.  ?  ? ? ?No results found for any visits on 09/02/21. ? Assessment & Plan  ?  ? ?Problem List Items Addressed This Visit   ? ?  ? Cardiovascular and Mediastinum  ? Hypertension  associated with diabetes ([place])  ?  Chronic and stable ?Continue Zestoretic at 20-25 mg ?Goal for BP is <130/<80 ?Consider DASH diet modification to assist in addition to weight loss of 5-10% ? ?  ?  ? Relevant Medications  ? lisinopril-hydrochlorothiazide (ZESTORETIC) 20-25 MG tablet  ?  ? Endocrine  ? Type 2 diabetes mellitus with hyperglycemia, without long-term current use of insulin (HCC) - Primary  ?  Chronic, exacerbated  ?Not due for A1c today ?Continues oral agents, without difficulty ?Was started on Metformin XR 750 mg and Glipizide 10 mg BID when A1c >8% ?Continue to recommend balanced, lower carb meals. Smaller meal size, adding snacks. Choosing water as drink of choice and increasing purposeful exercise. ? ? ?  ?  ? Relevant Medications  ? lisinopril-hydrochlorothiazide (ZESTORETIC) 20-25 MG tablet  ? Other Relevant Orders  ? Urine Microalbumin w/creat. ratio  ?  ? Other  ? Depression, recurrent ([place])  ?  Chronic, slight improvement based on daughter opinion and PCP interaction ?PHQ remains stable; however, unclear if patient is being honest ?Does not wish to see formal psychiatry; is praying when feeling down, depressed or hopeless ?Good support system with family  ? ?  ?  ? GAD (generalized anxiety disorder)  ?  Chronic, stable ?GAD not completed today ?Now off xanax given misuse ?Doing well ?Continue to recommend self care behaviors to assist with anxiety ? ?  ?  ? ? ? ?Return in about 2 months (around 11/02/2021) for HTN management, anxiety and depression, T2DM management.  ?   ? ?I, Gwyneth Sprout, FNP, have reviewed all documentation for this visit. The documentation on 09/02/21 for  the exam, diagnosis, procedures, and orders are all accurate and complete. ? ? ? ?Gwyneth Sprout, FNP  ?[place] ?[phone] (phone) ?[phone] (fax) ? ?[facility]  ?

## 2021-09-02 NOTE — Assessment & Plan Note (Signed)
Chronic, slight improvement based on daughter opinion and PCP interaction ?PHQ remains stable; however, unclear if patient is being honest ?Does not wish to see formal psychiatry; is praying when feeling down, depressed or hopeless ?Good support system with family  ?

## 2021-09-02 NOTE — Assessment & Plan Note (Signed)
Chronic, exacerbated  ?Not due for A1c today ?Continues oral agents, without difficulty ?Was started on Metformin XR 750 mg and Glipizide 10 mg BID when A1c >8% ?Continue to recommend balanced, lower carb meals. Smaller meal size, adding snacks. Choosing water as drink of choice and increasing purposeful exercise. ? ?

## 2021-09-02 NOTE — Assessment & Plan Note (Signed)
Chronic and stable ?Continue Zestoretic at 20-25 mg ?Goal for BP is <130/<80 ?Consider DASH diet modification to assist in addition to weight loss of 5-10% ?

## 2021-09-02 NOTE — Assessment & Plan Note (Signed)
Chronic, stable ?GAD not completed today ?Now off xanax given misuse ?Doing well ?Continue to recommend self care behaviors to assist with anxiety ?

## 2021-09-03 ENCOUNTER — Ambulatory Visit
Admission: RE | Admit: 2021-09-03 | Discharge: 2021-09-03 | Disposition: A | Discharge status: Home / Self Care | Payer: 59 | Source: Ambulatory Visit | Attending: Family Medicine | Admitting: Family Medicine

## 2021-09-03 DIAGNOSIS — Z1231 Encounter for screening mammogram for malignant neoplasm of breast: Secondary | ICD-10-CM | POA: Insufficient documentation

## 2021-09-03 LAB — MICROALBUMIN / CREATININE URINE RATIO
Creatinine, Urine: 164 mg/dL
Microalb/Creat Ratio: 38 mg/g creat — ABNORMAL HIGH (ref 0–29)
Microalbumin, Urine: 61.6 ug/mL

## 2021-09-03 LAB — SPECIMEN STATUS REPORT

## 2021-09-04 ENCOUNTER — Other Ambulatory Visit: Payer: Self-pay | Admitting: Family Medicine

## 2021-09-04 DIAGNOSIS — E559 Vitamin D deficiency, unspecified: Secondary | ICD-10-CM

## 2021-09-04 DIAGNOSIS — F339 Major depressive disorder, recurrent, unspecified: Secondary | ICD-10-CM

## 2021-09-04 DIAGNOSIS — F411 Generalized anxiety disorder: Secondary | ICD-10-CM

## 2021-09-04 MED ORDER — ARIPIPRAZOLE 2 MG PO TABS: 2.0000 mg | ORAL_TABLET | Freq: Every day | ORAL | 11 refills | Status: DC

## 2021-09-07 NOTE — Progress Notes (Signed)
? ?ANNUAL PREVENTATIVE CARE GYNECOLOGY  ENCOUNTER NOTE ? ?Subjective:  ?    ? Regina Pierce is a 66 y.o. G36P2002 female here for a routine annual gynecologic exam. The patient is not sexually active. The patient is not currently taking hormone replacement therapy. Patient denies post-menopausal vaginal bleeding. The patient wears seatbelts: yes. The patient participates in regular exercise: yes. Has the patient ever been transfused or tattooed?: no. The patient reports that there is not domestic violence in her life. ? ?Patient has no current complaints:.  ?  ?Gynecologic History ?Patient's last menstrual period was 04/27/2007. ?Contraception: post menopausal status ?Last Pap: 07/04/2018. Results were: normal. Previous h/o abnormal pap smear in 2018 with LGSIL pap smear, followed by colposcopy. Pap smear in 2019 ASCUS HR HPV neg. ?Last mammogram: 09/03/2021. Results were: normal ?Last Colonoscopy: 2018. Results were: normal (except diverticulosis noted). Recommended 5 year follow up ?Last Dexa Scan: Patient has never had one. ? ? ?Obstetric History ?OB History  ?Gravida Para Term Preterm AB Living  ?'2 2 2     2  '$ ?SAB IAB Ectopic Multiple Live Births  ?        2  ?  ?# Outcome Date GA Lbr Len/2nd Weight Sex Delivery Anes PTL Lv  ?2 Term      Vag-Spont   LIV  ?1 Term      Vag-Spont   LIV  ? ? ?Past Medical History:  ?Diagnosis Date  ? Anxiety   ? Depression   ? Diabetes mellitus without complication ([place])   ? no meds  ? Hyperlipidemia   ? Hypertension   ? Thyroid disease   ? ? ?Family History  ?Problem Relation Age of Onset  ? Hypertension Sister   ? Breast cancer Neg Hx   ? Colon cancer Neg Hx   ? ? ?Past Surgical History:  ?Procedure Laterality Date  ? ACHILLES TENDON SURGERY Left 05/12/2016  ? Procedure: ACHILLES TENDON REPAIR;  Surgeon: Samara Deist, DPM;  Location: [facility] ORS;  Service: Podiatry;  Laterality: Left;  ? BONE EXOSTOSIS EXCISION Left 05/12/2016  ? Procedure: CALCANEAL EXOSTECTOMY AND DORSAL EXOSTECTOMY;   Surgeon: Samara Deist, DPM;  Location: [facility] ORS;  Service: Podiatry;  Laterality: Left;  ? COLONOSCOPY WITH PROPOFOL N/A 07/20/2016  ? Procedure: COLONOSCOPY WITH PROPOFOL;  Surgeon: Jonathon Bellows, MD;  Location[facility] ENDOSCOPY;  Service: Endoscopy;  Laterality: N/A;  ? HAND SURGERY Bilateral 1999  ? carpal tunnel   ? KNEE SURGERY  2010  ? TUBAL LIGATION  1987  ? ? ?Social History  ? ?Socioeconomic History  ? Marital status: Married  ?  Spouse name: Not on file  ? Number of children: Not on file  ? Years of education: Not on file  ? Highest education level: Not on file  ?Occupational History  ? Not on file  ?Tobacco Use  ? Smoking status: Every Day  ?  Packs/day: 1.00  ?  Years: 30.00  ?  Pack years: 30.00  ?  Types: Cigarettes  ? Smokeless tobacco: Never  ?Vaping Use  ? Vaping Use: Never used  ?Substance and Sexual Activity  ? Alcohol use: No  ?  Alcohol/week: 0.0 standard drinks  ? Drug use: No  ? Sexual activity: Not Currently  ?  Birth control/protection: Post-menopausal  ?Other Topics Concern  ? Not on file  ?Social History Narrative  ? Not on file  ? ?Social Determinants of Health  ? ?Financial Resource Strain: Not on file  ?Food Insecurity: Not  on file  ?Transportation Needs: Not on file  ?Physical Activity: Not on file  ?Stress: Not on file  ?Social Connections: Not on file  ?Intimate Partner Violence: Not on file  ? ? ?Current Outpatient Medications on File Prior to Visit  ?Medication Sig Dispense Refill  ? ARIPiprazole (ABILIFY) 2 MG tablet Take 1 tablet (2 mg total) by mouth daily. To begin following 1/2 of 5 mg dose wean. 30 tablet 11  ? aspirin 81 MG tablet Take 81 mg by mouth every evening.     ? cholecalciferol (VITAMIN D3) 25 MCG (1000 UNIT) tablet Take 2,000 Units by mouth daily.    ? desvenlafaxine (PRISTIQ) 100 MG 24 hr tablet TAKE 1 TABLET BY MOUTH ONCE A DAY 90 tablet 1  ? glipiZIDE (GLUCOTROL) 10 MG tablet Take 1 tablet (10 mg total) by mouth 2 (two) times daily before a meal. 180 tablet 1  ?  levothyroxine (SYNTHROID) 125 MCG tablet Take 1 tablet (125 mcg total) by mouth daily. Repeat labs prior to refill of this medication 90 tablet 0  ? lisinopril-hydrochlorothiazide (ZESTORETIC) 20-25 MG tablet Take 1 tablet by mouth daily. 90 tablet 3  ? metFORMIN (GLUCOPHAGE-XR) 750 MG 24 hr tablet Take 1 tablet (750 mg total) by mouth 2 (two) times daily with breakfast and lunch. 180 tablet 1  ? metoprolol succinate (TOPROL-XL) 25 MG 24 hr tablet Take 1 tablet (25 mg total) by mouth daily. 90 tablet 3  ? naproxen sodium (ANAPROX) 220 MG tablet Take 440 mg by mouth daily as needed (pain).    ? simvastatin (ZOCOR) 40 MG tablet Take 1 tablet (40 mg total) by mouth at bedtime. 90 tablet 3  ? traZODone (DESYREL) 50 MG tablet Take 1 tablet (50 mg total) by mouth at bedtime as needed for sleep. (Patient taking differently: Take 25 mg by mouth every other day.) 30 tablet 0  ? ?No current facility-administered medications on file prior to visit.  ? ? ?No Known Allergies ? ? ? ?Review of Systems ?ROS ?Review of Systems - General ROS: negative for - chills, fatigue, fever, hot flashes, night sweats, weight gain or weight loss ?Psychological ROS: negative for - anxiety, decreased libido, depression, mood swings, physical abuse or sexual abuse ?Ophthalmic ROS: negative for - blurry vision, eye pain or loss of vision ?ENT ROS: negative for - headaches, hearing change, visual changes or vocal changes ?Allergy and Immunology ROS: negative for - hives, itchy/watery eyes or seasonal allergies ?Hematological and Lymphatic ROS: negative for - bleeding problems, bruising, swollen lymph nodes or weight loss ?Endocrine ROS: negative for - galactorrhea, hair pattern changes, hot flashes, malaise/lethargy, mood swings, palpitations, polydipsia/polyuria, skin changes, temperature intolerance or unexpected weight changes ?Breast ROS: negative for - new or changing breast lumps or nipple discharge ?Respiratory ROS: negative for - cough or  shortness of breath ?Cardiovascular ROS: negative for - chest pain, irregular heartbeat, palpitations or shortness of breath ?Gastrointestinal ROS: no abdominal pain, change in bowel habits, or black or bloody stools ?Genito-Urinary ROS: no dysuria, trouble voiding, or hematuria ?Musculoskeletal ROS: negative for - joint pain or joint stiffness ?Neurological ROS: negative for - bowel and bladder control changes ?Dermatological ROS: negative for rash and skin lesion changes ?  ?Objective:  ? ?BP (!) 116/57   Pulse 69   Ht '5\' 4"'$  (1.626 m)   Wt 200 lb 1.6 oz (90.8 kg)   LMP 04/27/2007   BMI 34.35 kg/m?  ?CONSTITUTIONAL: Well-developed, well-nourished female in no acute distress.  ?PSYCHIATRIC:  Normal mood and affect. Normal behavior. Normal speech. ?[place]: Alert and oriented to person, place, and time. Normal muscle tone coordination. No cranial nerve deficit noted. ?HENT:  Normocephalic, atraumatic, External right and left ear normal. Oropharynx is clear and moist ?EYES: Conjunctivae and EOM are normal. Pupils are equal, round, and reactive to light. No scleral icterus.  ?NECK: Normal range of motion, supple, no masses.  Normal thyroid.  ?SKIN: Skin is warm and dry. No rash noted. Not diaphoretic. No erythema. No pallor. ?CARDIOVASCULAR: Normal heart rate noted, regular rhythm, no murmur. ?RESPIRATORY: Clear to auscultation bilaterally. Effort and breath sounds normal, no problems with respiration noted. ?BREASTS: Symmetric in size. No masses, skin changes, nipple drainage, or lymphadenopathy. ?ABDOMEN: Soft, normal bowel sounds, no distention noted.  No tenderness, rebound or guarding.  ?BLADDER: Normal ?PELVIC: ? Bladder no bladder distension noted ? Urethra: normal appearing urethra with no masses, tenderness or lesions ? Vulva: normal appearing vulva with no masses, tenderness or lesions ? Vagina: normal appearing vagina with normal color and discharge, no lesions ? Cervix: normal appearing cervix without  discharge or lesions ? Uterus: uterus is normal size, shape, consistency and nontender ? Adnexa: normal adnexa in size, nontender and no masses ? RV:  Rectal exam with large anal skin tag and Normal Sphincter

## 2021-09-07 NOTE — Patient Instructions (Signed)
Breast Self-Awareness ?Breast self-awareness is knowing how your breasts look and feel. You need to: ?Check your breasts on a regular basis. ?Tell your doctor about any changes. ?Become familiar with the look and feel of your breasts. This can help you catch a breast problem while it is still small and can be treated. You should do breast self-exams even if you have breast implants. ?What you need: ?A mirror. ?A well-lit room. ?A pillow or other soft object. ?How to do a breast self-exam ?Follow these steps to do a breast self-exam: ?Look for changes ? ?Take off all the clothes above your waist. ?Stand in front of a mirror in a room with good lighting. ?Put your hands down at your sides. ?Compare your breasts in the mirror. Look for any difference between them, such as: ?A difference in shape. ?A difference in size. ?Wrinkles, dips, and bumps in one breast and not the other. ?Look at each breast for changes in the skin, such as: ?Redness. ?Scaly areas. ?Skin that has gotten thicker. ?Dimpling. ?Open sores (ulcers). ?Look for changes in your nipples, such as: ?Fluid coming out of a nipple. ?Fluid around a nipple. ?Bleeding. ?Dimpling. ?Redness. ?A nipple that looks pushed in (retracted), or that has changed position. ?Feel for changes ?Lie on your back. ?Feel each breast. To do this: ?Pick a breast to feel. ?Place a pillow under the shoulder closest to that breast. Put the arm closest to that breast behind your head. ?Feel the nipple area of that breast using the hand of your other arm. Feel the area with the pads of your three middle fingers by making small circles with your fingers. Use light, medium, and firm pressure. ?Continue the overlapping circles, moving downward over the breast. Keep making circles with your fingers. Stop when you feel your ribs. ?Start making circles with your fingers again, this time going upward until you reach your collarbone. ?Then, make circles outward across your breast and into your  armpit area. ?Squeeze your nipple. Check for discharge and lumps. ?Repeat these steps to check your other breast. ?Sit or stand in the tub or shower. ?With soapy water on your skin, feel each breast the same way you did when you were lying down. ?Write down what you find ?Writing down what you find can help you remember what to tell your doctor. Write down: ?What is normal for each breast. ?Any changes you find in each breast. These include: ?The kind of changes you find. ?A tender or painful breast. ?Any lump you find. Write down its size and where it is. ?When you last had your monthly period (menstrual cycle). ?General tips ?If you are breastfeeding, the best time to check your breasts is after you feed your baby or after you use a breast pump. ?If you get monthly bleeding, the best time to check your breasts is 5-7 days after your monthly cycle ends. ?With time, you will become comfortable with the self-exam. You will also start to know if there are changes in your breasts. ?Contact a doctor if: ?You see a change in the shape or size of your breasts or nipples. ?You see a change in the skin of your breast or nipples, such as red or scaly skin. ?You have fluid coming from your nipples that is not normal. ?You find a new lump or thick area. ?You have breast pain. ?You have any concerns about your breast health. ?Summary ?Breast self-awareness includes looking for changes in your breasts and feeling for changes   within your breasts. ?You should do breast self-awareness in front of a mirror in a well-lit room. ?If you get monthly periods (menstrual cycles), the best time to check your breasts is 5-7 days after your period ends. ?Tell your doctor about any changes you see in your breasts. Changes include changes in size, changes on the skin, painful or tender breasts, or fluid from your nipples that is not normal. ?This information is not intended to replace advice given to you by your health care provider. Make sure  you discuss any questions you have with your health care provider. ?Document Revised: 02/12/2021 Document Reviewed: 02/12/2021 ?Elsevier Patient Education ? [place]. ?Preventive Care 29-58 Years Old, Female ?Preventive care refers to lifestyle choices and visits with your health care provider that can promote health and wellness. Preventive care visits are also called wellness exams. ?What can I expect for my preventive care visit? ?Counseling ?Your health care provider may ask you questions about your: ?Medical history, including: ?Past medical problems. ?Family medical history. ?Pregnancy history. ?Current health, including: ?Menstrual cycle. ?Method of birth control. ?Emotional well-being. ?Home life and relationship well-being. ?Sexual activity and sexual health. ?Lifestyle, including: ?Alcohol, nicotine or tobacco, and drug use. ?Access to firearms. ?Diet, exercise, and sleep habits. ?Work and work Statistician. ?Sunscreen use. ?Safety issues such as seatbelt and bike helmet use. ?Physical exam ?Your health care provider will check your: ?Height and weight. These may be used to calculate your BMI (body mass index). BMI is a measurement that tells if you are at a healthy weight. ?Waist circumference. This measures the distance around your waistline. This measurement also tells if you are at a healthy weight and may help predict your risk of certain diseases, such as type 2 diabetes and high blood pressure. ?Heart rate and blood pressure. ?Body temperature. ?Skin for abnormal spots. ?What immunizations do I need? ? ?Vaccines are usually given at various ages, according to a schedule. Your health care provider will recommend vaccines for you based on your age, medical history, and lifestyle or other factors, such as travel or where you work. ?What tests do I need? ?Screening ?Your health care provider may recommend screening tests for certain conditions. This may include: ?Lipid and cholesterol  levels. ?Diabetes screening. This is done by checking your blood sugar (glucose) after you have not eaten for a while (fasting). ?Pelvic exam and Pap test. ?Hepatitis B test. ?Hepatitis C test. ?HIV (human immunodeficiency virus) test. ?STI (sexually transmitted infection) testing, if you are at risk. ?Lung cancer screening. ?Colorectal cancer screening. ?Mammogram. Talk with your health care provider about when you should start having regular mammograms. This may depend on whether you have a family history of breast cancer. ?BRCA-related cancer screening. This may be done if you have a family history of breast, ovarian, tubal, or peritoneal cancers. ?Bone density scan. This is done to screen for osteoporosis. ?Talk with your health care provider about your test results, treatment options, and if necessary, the need for more tests. ?Follow these instructions at home: ?Eating and drinking ? ?Eat a diet that includes fresh fruits and vegetables, whole grains, lean protein, and low-fat dairy products. ?Take vitamin and mineral supplements as recommended by your health care provider. ?Do not drink alcohol if: ?Your health care provider tells you not to drink. ?You are pregnant, may be pregnant, or are planning to become pregnant. ?If you drink alcohol: ?Limit how much you have to 0-1 drink a day. ?Know how much alcohol is in your  drink. In the [place] one drink equals one 12 oz bottle of beer (355 mL), one 5 oz glass of wine (148 mL), or one 1? oz glass of hard liquor (44 mL). ?Lifestyle ?Brush your teeth every morning and night with fluoride toothpaste. Floss one time each day. ?Exercise for at least 30 minutes 5 or more days each week. ?Do not use any products that contain nicotine or tobacco. These products include cigarettes, chewing tobacco, and vaping devices, such as e-cigarettes. If you need help quitting, ask your health care provider. ?Do not use drugs. ?If you are sexually active, practice safe sex. Use a  condom or other form of protection to prevent STIs. ?If you do not wish to become pregnant, use a form of birth control. If you plan to become pregnant, see your health care provider for a prepregnancy visit. ?Take

## 2021-09-08 ENCOUNTER — Other Ambulatory Visit (HOSPITAL_COMMUNITY)
Admission: RE | Admit: 2021-09-08 | Discharge: 2021-09-08 | Disposition: A | Discharge status: Home / Self Care | Payer: 59 | Source: Ambulatory Visit | Attending: Obstetrics and Gynecology | Admitting: Obstetrics and Gynecology

## 2021-09-08 ENCOUNTER — Encounter: Payer: Self-pay | Admitting: Gastroenterology

## 2021-09-08 ENCOUNTER — Ambulatory Visit: Payer: 59 | Admitting: Obstetrics and Gynecology

## 2021-09-08 ENCOUNTER — Encounter: Payer: Self-pay | Admitting: Obstetrics and Gynecology

## 2021-09-08 VITALS — BP 116/57 | HR 69 | Ht 64.0 in | Wt 200.1 lb

## 2021-09-08 DIAGNOSIS — Z124 Encounter for screening for malignant neoplasm of cervix: Secondary | ICD-10-CM | POA: Diagnosis not present

## 2021-09-08 DIAGNOSIS — E785 Hyperlipidemia, unspecified: Secondary | ICD-10-CM | POA: Diagnosis not present

## 2021-09-08 DIAGNOSIS — E034 Atrophy of thyroid (acquired): Secondary | ICD-10-CM

## 2021-09-08 DIAGNOSIS — E1169 Type 2 diabetes mellitus with other specified complication: Secondary | ICD-10-CM | POA: Diagnosis not present

## 2021-09-08 DIAGNOSIS — F339 Major depressive disorder, recurrent, unspecified: Secondary | ICD-10-CM

## 2021-09-08 DIAGNOSIS — E1159 Type 2 diabetes mellitus with other circulatory complications: Secondary | ICD-10-CM | POA: Diagnosis not present

## 2021-09-08 DIAGNOSIS — R69 Illness, unspecified: Secondary | ICD-10-CM | POA: Diagnosis not present

## 2021-09-08 DIAGNOSIS — E1165 Type 2 diabetes mellitus with hyperglycemia: Secondary | ICD-10-CM

## 2021-09-08 DIAGNOSIS — F411 Generalized anxiety disorder: Secondary | ICD-10-CM

## 2021-09-08 DIAGNOSIS — I152 Hypertension secondary to endocrine disorders: Secondary | ICD-10-CM | POA: Diagnosis not present

## 2021-09-08 DIAGNOSIS — Z01419 Encounter for gynecological examination (general) (routine) without abnormal findings: Secondary | ICD-10-CM | POA: Insufficient documentation

## 2021-09-09 ENCOUNTER — Ambulatory Visit: Payer: 59 | Admitting: Certified Registered"

## 2021-09-09 ENCOUNTER — Encounter: Payer: Self-pay | Admitting: Gastroenterology

## 2021-09-09 ENCOUNTER — Ambulatory Visit
Admission: RE | Admit: 2021-09-09 | Discharge: 2021-09-09 | Disposition: A | Discharge status: Home / Self Care | Payer: 59 | Source: Ambulatory Visit | Attending: Gastroenterology | Admitting: Gastroenterology

## 2021-09-09 ENCOUNTER — Encounter
Admission: RE | Disposition: A | Discharge status: Home / Self Care | Payer: Self-pay | Source: Ambulatory Visit | Attending: Gastroenterology

## 2021-09-09 DIAGNOSIS — K635 Polyp of colon: Secondary | ICD-10-CM | POA: Diagnosis not present

## 2021-09-09 DIAGNOSIS — K640 First degree hemorrhoids: Secondary | ICD-10-CM | POA: Diagnosis not present

## 2021-09-09 DIAGNOSIS — D123 Benign neoplasm of transverse colon: Secondary | ICD-10-CM | POA: Insufficient documentation

## 2021-09-09 DIAGNOSIS — D122 Benign neoplasm of ascending colon: Secondary | ICD-10-CM | POA: Diagnosis not present

## 2021-09-09 DIAGNOSIS — E039 Hypothyroidism, unspecified: Secondary | ICD-10-CM | POA: Diagnosis not present

## 2021-09-09 DIAGNOSIS — K649 Unspecified hemorrhoids: Secondary | ICD-10-CM | POA: Diagnosis not present

## 2021-09-09 DIAGNOSIS — Z86010 Personal history of colonic polyps: Secondary | ICD-10-CM | POA: Diagnosis not present

## 2021-09-09 DIAGNOSIS — K5730 Diverticulosis of large intestine without perforation or abscess without bleeding: Secondary | ICD-10-CM | POA: Insufficient documentation

## 2021-09-09 DIAGNOSIS — D125 Benign neoplasm of sigmoid colon: Secondary | ICD-10-CM | POA: Diagnosis not present

## 2021-09-09 DIAGNOSIS — Z1211 Encounter for screening for malignant neoplasm of colon: Secondary | ICD-10-CM | POA: Insufficient documentation

## 2021-09-09 HISTORY — PX: COLONOSCOPY WITH PROPOFOL: SHX5780

## 2021-09-09 LAB — GLUCOSE, CAPILLARY: Glucose-Capillary: 138 mg/dL — ABNORMAL HIGH (ref 70–99)

## 2021-09-09 SURGERY — COLONOSCOPY WITH PROPOFOL
Anesthesia: General

## 2021-09-09 MED ORDER — EPHEDRINE SULFATE (PRESSORS) 50 MG/ML IJ SOLN
INTRAMUSCULAR | Status: DC | PRN
  Administered 2021-09-09: 5 mg via INTRAVENOUS

## 2021-09-09 MED ORDER — GLYCOPYRROLATE 0.2 MG/ML IJ SOLN
INTRAMUSCULAR | Status: DC | PRN
Start: 2021-09-09 — End: 2021-09-09
  Administered 2021-09-09: .2 mg via INTRAVENOUS

## 2021-09-09 MED ORDER — PROPOFOL 500 MG/50ML IV EMUL
INTRAVENOUS | Status: DC | PRN
  Administered 2021-09-09: 155 ug/kg/min via INTRAVENOUS

## 2021-09-09 MED ORDER — PROPOFOL 10 MG/ML IV BOLUS
INTRAVENOUS | Status: DC | PRN
  Administered 2021-09-09: 60 mg via INTRAVENOUS

## 2021-09-09 MED ORDER — LIDOCAINE HCL (CARDIAC) PF 100 MG/5ML IV SOSY
PREFILLED_SYRINGE | INTRAVENOUS | Status: DC | PRN
Start: 2021-09-09 — End: 2021-09-09
  Administered 2021-09-09: 100 mg via INTRAVENOUS

## 2021-09-09 MED ORDER — SODIUM CHLORIDE 0.9 % IV SOLN: INTRAVENOUS | Status: DC

## 2021-09-09 NOTE — H&P (Signed)
?Regina Bellows, MD ?[address] [place], [place], [place], [address] ?[address] [place], [place], [place], [address] ?Phone: [phone]  ?Fax: [phone] ? ?Primary Care Physician:  Gwyneth Sprout, FNP ? ? ?Pre-Procedure History & Physical: ?HPI:  Regina Pierce is a 66 y.o. female is here for an colonoscopy. ?  ?Past Medical History:  ?Diagnosis Date  ? Anxiety   ? Depression   ? Diabetes mellitus without complication ([place])   ? no meds  ? Hyperlipidemia   ? Hypertension   ? Thyroid disease   ? ? ?Past Surgical History:  ?Procedure Laterality Date  ? ACHILLES TENDON SURGERY Left 05/12/2016  ? Procedure: ACHILLES TENDON REPAIR;  Surgeon: Samara Deist, DPM;  Location: [facility] ORS;  Service: Podiatry;  Laterality: Left;  ? BONE EXOSTOSIS EXCISION Left 05/12/2016  ? Procedure: CALCANEAL EXOSTECTOMY AND DORSAL EXOSTECTOMY;  Surgeon: Samara Deist, DPM;  Location: [facility] ORS;  Service: Podiatry;  Laterality: Left;  ? COLONOSCOPY WITH PROPOFOL N/A 07/20/2016  ? Procedure: COLONOSCOPY WITH PROPOFOL;  Surgeon: Regina Bellows, MD;  Location: [facility] ENDOSCOPY;  Service: Endoscopy;  Laterality: N/A;  ? HAND SURGERY Bilateral 1999  ? carpal tunnel   ? KNEE SURGERY  2010  ? TUBAL LIGATION  1987  ? ? ?Prior to Admission medications   ?Medication Sig Start Date End Date Taking? Authorizing Provider  ?ARIPiprazole (ABILIFY) 2 MG tablet Take 1 tablet (2 mg total) by mouth daily. To begin following 1/2 of 5 mg dose wean. 09/04/21  Yes Tally Joe T, FNP  ?aspirin 81 MG tablet Take 81 mg by mouth every evening.    Yes [provider]  ?cholecalciferol (VITAMIN D3) 25 MCG (1000 UNIT) tablet Take 2,000 Units by mouth daily.   Yes [provider]  ?desvenlafaxine (PRISTIQ) 100 MG 24 hr tablet TAKE 1 TABLET BY MOUTH ONCE A DAY 07/14/21  Yes Gwyneth Sprout, FNP  ?glipiZIDE (GLUCOTROL) 10 MG tablet Take 1 tablet (10 mg total) by mouth 2 (two) times daily before a meal. 07/30/21  Yes Gwyneth Sprout, FNP  ?levothyroxine (SYNTHROID)  125 MCG tablet Take 1 tablet (125 mcg total) by mouth daily. Repeat labs prior to refill of this medication 07/30/21  Yes Gwyneth Sprout, FNP  ?lisinopril-hydrochlorothiazide (ZESTORETIC) 20-25 MG tablet Take 1 tablet by mouth daily. 09/02/21  Yes Gwyneth Sprout, FNP  ?metFORMIN (GLUCOPHAGE-XR) 750 MG 24 hr tablet Take 1 tablet (750 mg total) by mouth 2 (two) times daily with breakfast and lunch. 07/30/21  Yes Gwyneth Sprout, FNP  ?metoprolol succinate (TOPROL-XL) 25 MG 24 hr tablet Take 1 tablet (25 mg total) by mouth daily. 07/11/20  Yes Mar Daring, [name]  ?naproxen sodium (ANAPROX) 220 MG tablet Take 440 mg by mouth daily as needed (pain).   Yes [provider]  ?simvastatin (ZOCOR) 40 MG tablet Take 1 tablet (40 mg total) by mouth at bedtime. 08/11/21  Yes Gwyneth Sprout, FNP  ?traZODone (DESYREL) 50 MG tablet Take 1 tablet (50 mg total) by mouth at bedtime as needed for sleep. ?Patient taking differently: Take 25 mg by mouth every other day. 07/29/21  Yes Gwyneth Sprout, FNP  ? ? ?Allergies as of 08/21/2021  ? (No Known Allergies)  ? ? ?Family History  ?Problem Relation Age of Onset  ? Hypertension Sister   ? Breast cancer Neg Hx   ? Colon cancer Neg Hx   ? ? ?Social History  ? ?Socioeconomic History  ? Marital status: Married  ?  Spouse name: Not on file  ? Number of children: Not on file  ? Years of education: Not on file  ? Highest education level: Not on file  ?Occupational History  ? Not on file  ?Tobacco Use  ? Smoking status: Every Day  ?  Packs/day: 1.00  ?  Years: 30.00  ?  Pack years: 30.00  ?  Types: Cigarettes  ? Smokeless tobacco: Never  ?Vaping Use  ? Vaping Use: Never used  ?Substance and Sexual Activity  ? Alcohol use: No  ?  Alcohol/week: 0.0 standard drinks  ? Drug use: No  ? Sexual activity: Not Currently  ?  Birth control/protection: Post-menopausal  ?Other Topics Concern  ? Not on file  ?Social History Narrative  ? Not on file  ? ?Social Determinants of Health  ? ?Financial  Resource Strain: Not on file  ?Food Insecurity: Not on file  ?Transportation Needs: Not on file  ?Physical Activity: Not on file  ?Stress: Not on file  ?Social Connections: Not on file  ?Intimate Partner Violence: Not on file  ? ? ?Review of Systems: ?See HPI, otherwise negative ROS ? ?Physical Exam: ?BP 117/74   Pulse 80   Temp 98.1 ?F (36.7 ?C) (Temporal)   Resp 16   Ht 5' 4.5" (1.638 m)   Wt 90.9 kg   LMP 04/27/2007   SpO2 96%   BMI 33.87 kg/m?  ?General:   Alert,  pleasant and cooperative in NAD ?Head:  Normocephalic and atraumatic. ?Neck:  Supple; no masses or thyromegaly. ?Lungs:  Clear throughout to auscultation, normal respiratory effort.    ?Heart:  +S1, +S2, Regular rate and rhythm, No edema. ?Abdomen:  Soft, nontender and nondistended. Normal bowel sounds, without guarding, and without rebound.   ?Neurologic:  Alert and  oriented x4;  grossly normal neurologically. ? ?Impression/Plan: ?Regina Pierce is here for an colonoscopy to be performed for surveillance due to prior history of colon polyps  ? ?Risks, benefits, limitations, and alternatives regarding  colonoscopy have been reviewed with the patient.  Questions have been answered.  All parties agreeable. ? ? ?Regina Bellows, MD  09/09/2021, 8:49 AM ? ?

## 2021-09-09 NOTE — Op Note (Signed)
[facility] ?Gastroenterology ?Patient Name: Regina Pierce ?Procedure Date: 09/09/2021 8:51 AM ?MRN: [id number] ?Account #: [id number] ?Date of Birth: June 27, 1955 ?Admit Type: Outpatient ?Age: 66 ?Room: [facility] ENDO ROOM 3 ?Gender: Female ?Note Status: Finalized ?Instrument Name: Colonoscope [id number] ?Procedure:             Colonoscopy ?Indications:           Surveillance: Personal history of adenomatous polyps  ?                       on last colonoscopy 5 years ago ?Providers:             Jonathon Bellows MD, MD ?Referring MD:          Jaci Standard. Rollene Rotunda (Referring MD) ?Medicines:             Monitored Anesthesia Care ?Complications:         No immediate complications. ?Procedure:             Pre-Anesthesia Assessment: ?                       - Prior to the procedure, a History and Physical was  ?                       performed, and patient medications, allergies and  ?                       sensitivities were reviewed. The patient's tolerance  ?                       of previous anesthesia was reviewed. ?                       - The risks and benefits of the procedure and the  ?                       sedation options and risks were discussed with the  ?                       patient. All questions were answered and informed  ?                       consent was obtained. ?                       - ASA Grade Assessment: III - A patient with severe  ?                       systemic disease. ?                       After obtaining informed consent, the colonoscope was  ?                       passed under direct vision. Throughout the procedure,  ?                       the patient's blood pressure, pulse, and oxygen  ?                       saturations  were monitored continuously. The  ?                       Colonoscope was introduced through the anus and  ?                       advanced to the the cecum, identified by the  ?                       appendiceal orifice. The colonoscopy was performed  ?                        with ease. The patient tolerated the procedure well.  ?                       The quality of the bowel preparation was adequate. ?Findings: ?     The perianal and digital rectal examinations were normal. ?     Non-bleeding internal hemorrhoids were found during retroflexion. The  ?     hemorrhoids were medium-sized and Grade I (internal hemorrhoids that do  ?     not prolapse). ?     Multiple small-mouthed diverticula were found in the left colon. ?     Six sessile polyps were found in the sigmoid colon. The polyps were 4 to  ?     6 mm in size. These polyps were removed with a cold snare. Resection and  ?     retrieval were complete. ?     Four sessile polyps were found in the ascending colon. The polyps were 5  ?     to 7 mm in size. These polyps were removed with a cold snare. Resection  ?     and retrieval were complete. ?     A 3 mm polyp was found in the cecum. The polyp was sessile. The polyp  ?     was removed with a jumbo cold forceps. Resection and retrieval were  ?     complete. ?     Two sessile polyps were found in the transverse colon. The polyps were 5  ?     to 7 mm in size. These polyps were removed with a cold snare. Resection  ?     and retrieval were complete. ?     The exam was otherwise without abnormality on direct and retroflexion  ?     views. ?Impression:            - Non-bleeding internal hemorrhoids. ?                       - Diverticulosis in the left colon. ?                       - Six 4 to 6 mm polyps in the sigmoid colon, removed  ?                       with a cold snare. Resected and retrieved. ?                       - Four 5 to 7 mm polyps in the ascending colon,  ?  removed with a cold snare. Resected and retrieved. ?                       - One 3 mm polyp in the cecum, removed with a jumbo  ?                       cold forceps. Resected and retrieved. ?                       - Two 5 to 7 mm polyps in the transverse colon,  ?                        removed with a cold snare. Resected and retrieved. ?                       - The examination was otherwise normal on direct and  ?                       retroflexion views. ?Recommendation:        - Discharge patient to home (with escort). ?                       - Resume previous diet. ?                       - Continue present medications. ?                       - Await pathology results. ?                       - Repeat colonoscopy in 1 year for surveillance. ?Procedure Code(s):     --- Professional --- ?                       [phone], Colonoscopy, flexible; with removal of  ?                       tumor(s), polyp(s), or other lesion(s) by snare  ?                       technique ?                       45380, 59, Colonoscopy, flexible; with biopsy, single  ?                       or multiple ?Diagnosis Code(s):     --- Professional --- ?                       Z86.010, Personal history of colonic polyps ?                       K63.5, Polyp of colon ?                       K64.0, First degree hemorrhoids ?                       K57.30, Diverticulosis of large intestine without  ?  perforation or abscess without bleeding ?CPT copyright 2019 American Medical Association. All rights reserved. ?The codes documented in this report are preliminary and upon coder review may  ?be revised to meet current compliance requirements. ?Jonathon Bellows, MD ?Jonathon Bellows MD, MD ?09/09/2021 9:25:54 AM ?This report has been signed electronically. ?Number of Addenda: 0 ?Note Initiated On: 09/09/2021 8:51 AM ?Scope Withdrawal Time: 0 hours 18 minutes 42 seconds  ?Total Procedure Duration: 0 hours 26 minutes 2 seconds  ?Estimated Blood Loss:  Estimated blood loss: none. ?     [facility] ?

## 2021-09-09 NOTE — Anesthesia Preprocedure Evaluation (Signed)
Anesthesia Evaluation  ?Patient identified by MRN, date of birth, ID band ?Patient awake ? ? ? ?Reviewed: ?Allergy & Precautions, H&P , NPO status , Patient's Chart, lab work & pertinent test results, reviewed documented beta blocker date and time  ? ?Airway ?Mallampati: II ? ? ?Neck ROM: full ? ? ? Dental ? ?(+) Poor Dentition ?  ?Pulmonary ?neg pulmonary ROS, Current Smoker and Patient abstained from smoking.,  ?  ?Pulmonary exam normal ? ? ? ? ? ? ? Cardiovascular ?Exercise Tolerance: Good ?hypertension, On Medications ?negative cardio ROS ?Normal cardiovascular exam ?Rhythm:regular Rate:Normal ? ? ?  ?Neuro/Psych ?Anxiety Depression negative neurological ROS ? negative psych ROS  ? GI/Hepatic ?negative GI ROS, Neg liver ROS,   ?Endo/Other  ?diabetes, Well Controlled, Type 2, Oral Hypoglycemic AgentsHypothyroidism  ? Renal/GU ?Renal disease  ?negative genitourinary ?  ?Musculoskeletal ? ? Abdominal ?  ?Peds ? Hematology ?negative hematology ROS ?(+)   ?Anesthesia Other Findings ?Past Medical History: ?No date: Anxiety ?No date: Depression ?No date: Diabetes mellitus without complication ([place]) ?    Comment:  no meds ?No date: Hyperlipidemia ?No date: Hypertension ?No date: Thyroid disease ?Past Surgical History: ?05/12/2016: ACHILLES TENDON SURGERY; Left ?    Comment:  Procedure: ACHILLES TENDON REPAIR;  Surgeon: Larkin Ina  ?             Vickki Muff, DPM;  Location: [facility] ORS;  Service: Podiatry;   ?             Laterality: Left; ?05/12/2016: BONE EXOSTOSIS EXCISION; Left ?    Comment:  Procedure: CALCANEAL EXOSTECTOMY AND DORSAL EXOSTECTOMY; ?             Surgeon: Samara Deist, DPM;  Location: [facility] ORS;   ?             Service: Podiatry;  Laterality: Left; ?07/20/2016: COLONOSCOPY WITH PROPOFOL; N/A ?    Comment:  Procedure: COLONOSCOPY WITH PROPOFOL;  Surgeon: Bailey Mech  ?             Vicente Males, MD;  Location: [facility] ENDOSCOPY;  Service: Endoscopy; ?             Laterality: N/A; ?1999: HAND SURGERY;  Bilateral ?    Comment:  carpal tunnel  ?2010: KNEE SURGERY ?1987: TUBAL LIGATION ?BMI   ? Body Mass Index: 33.87 kg/m?  ?  ? Reproductive/Obstetrics ?negative OB ROS ? ?  ? ? ? ? ? ? ? ? ? ? ? ? ? ?  ?  ? ? ? ? ? ? ? ? ?Anesthesia Physical ?Anesthesia Plan ? ?ASA: 2 ? ?Anesthesia Plan: General  ? ?Post-op Pain Management:   ? ?Induction:  ? ?PONV Risk Score and Plan:  ? ?Airway Management Planned:  ? ?Additional Equipment:  ? ?Intra-op Plan:  ? ?Post-operative Plan:  ? ?Informed Consent: I have reviewed the patients History and Physical, chart, labs and discussed the procedure including the risks, benefits and alternatives for the proposed anesthesia with the patient or authorized representative who has indicated his/her understanding and acceptance.  ? ? ? ?Dental Advisory Given ? ?Plan Discussed with: CRNA ? ?Anesthesia Plan Comments:   ? ? ? ? ? ? ?Anesthesia Quick Evaluation ? ?

## 2021-09-09 NOTE — Transfer of Care (Signed)
Immediate Anesthesia Transfer of Care Note ? ?Patient: Regina Pierce ? ?Procedure(s) Performed: COLONOSCOPY WITH PROPOFOL ? ?Patient Location: Endoscopy Unit ? ?Anesthesia Type:General ? ?Level of Consciousness: awake, drowsy and patient cooperative ? ?Airway & Oxygen Therapy: Patient Spontanous Breathing and Patient connected to face mask oxygen ? ?Post-op Assessment: Report given to RN and Post -op Vital signs reviewed and stable ? ?Post vital signs: Reviewed and stable ? ?Last Vitals:  ?Vitals Value Taken Time  ?BP 116/62 09/09/21 0927  ?Temp 35.9 ?C 09/09/21 0927  ?Pulse 103 09/09/21 0930  ?Resp 21 09/09/21 0930  ?SpO2 100 % 09/09/21 0930  ?Vitals shown include unvalidated device data. ? ?Last Pain:  ?Vitals:  ? 09/09/21 0927  ?TempSrc: Temporal  ?PainSc:   ?   ? ?  ? ?Complications: No notable events documented. ?

## 2021-09-09 NOTE — Anesthesia Postprocedure Evaluation (Signed)
Anesthesia Post Note ? ?Patient: JAKIA KENNEBREW ? ?Procedure(s) Performed: COLONOSCOPY WITH PROPOFOL ? ?Patient location during evaluation: PACU ?Anesthesia Type: General ?Level of consciousness: awake and alert ?Pain management: pain level controlled ?Vital Signs Assessment: post-procedure vital signs reviewed and stable ?Respiratory status: spontaneous breathing, nonlabored ventilation, respiratory function stable and patient connected to nasal cannula oxygen ?Cardiovascular status: blood pressure returned to baseline and stable ?Postop Assessment: no apparent nausea or vomiting ?Anesthetic complications: no ? ? ?No notable events documented. ? ? ?Last Vitals:  ?Vitals:  ? 09/09/21 0812 09/09/21 0927  ?BP: 117/74 116/62  ?Pulse: 80 (!) 105  ?Resp: 16 (!) 22  ?Temp: 36.7 ?C (!) 35.9 ?C  ?SpO2: 96% 100%  ?  ?Last Pain:  ?Vitals:  ? 09/09/21 0937  ?TempSrc:   ?PainSc: 0-No pain  ? ? ?  ?  ?  ?  ?  ?  ? ?Molli Barrows ? ? ? ? ?

## 2021-09-09 NOTE — Anesthesia Procedure Notes (Signed)
Procedure Name: General with mask airway ?Date/Time: 09/09/2021 8:59 AM ?Performed by: Kelton Pillar, CRNA ?Pre-anesthesia Checklist: Patient identified, Emergency Drugs available, Suction available and Patient being monitored ?Patient Re-evaluated:Patient Re-evaluated prior to induction ?Oxygen Delivery Method: Simple face mask ?Induction Type: IV induction ?Placement Confirmation: positive ETCO2 and CO2 detector ?Dental Injury: Teeth and Oropharynx as per pre-operative assessment  ? ? ? ? ?

## 2021-09-10 ENCOUNTER — Encounter: Payer: Self-pay | Admitting: Family Medicine

## 2021-09-10 ENCOUNTER — Encounter: Payer: Self-pay | Admitting: Gastroenterology

## 2021-09-10 LAB — SURGICAL PATHOLOGY

## 2021-09-10 NOTE — Progress Notes (Signed)
Multiple adenomas - repeat in1 year- offfer genetic testing evaluation as she has had > 10 adenomas

## 2021-09-10 NOTE — Progress Notes (Signed)
1year

## 2021-09-11 ENCOUNTER — Encounter: Payer: Self-pay | Admitting: Gastroenterology

## 2021-09-14 ENCOUNTER — Telehealth: Payer: Self-pay

## 2021-09-14 NOTE — Telephone Encounter (Signed)
Called patient but I had to leave her a voicemail to call me back.

## 2021-09-14 NOTE — Telephone Encounter (Signed)
-----   Message from Jonathon Bellows, MD sent at 09/10/2021 11:31 AM EDT ----- Multiple adenomas - repeat in1 year- offfer genetic testing evaluation as she has had > 10 adenomas

## 2021-09-15 ENCOUNTER — Ambulatory Visit: Payer: Self-pay | Admitting: *Deleted

## 2021-09-15 ENCOUNTER — Other Ambulatory Visit: Payer: Self-pay | Admitting: Family Medicine

## 2021-09-15 ENCOUNTER — Encounter: Payer: Self-pay | Admitting: Obstetrics and Gynecology

## 2021-09-15 DIAGNOSIS — H6123 Impacted cerumen, bilateral: Secondary | ICD-10-CM | POA: Diagnosis not present

## 2021-09-15 DIAGNOSIS — H6063 Unspecified chronic otitis externa, bilateral: Secondary | ICD-10-CM | POA: Diagnosis not present

## 2021-09-15 LAB — CYTOLOGY - PAP
Comment: NEGATIVE
Comment: NEGATIVE
Comment: NEGATIVE
Diagnosis: NEGATIVE
HPV 16: NEGATIVE
HPV 18 / 45: NEGATIVE
High risk HPV: POSITIVE — AB

## 2021-09-15 NOTE — Telephone Encounter (Signed)
  Chief Complaint: Anxiety Symptoms: Feeling anxious,nervous Frequency: 3 days ago Pertinent Negatives: Patient denies triggers. CP,palpitations,no suicidal or homicidal ideation, can work, go about ADLs Disposition: '[]'$ ED /'[]'$ Urgent Care (no appt availability in office) / '[]'$ Appointment(In office/virtual)/ '[]'$  [facility]/ '[]'$ Home Care/ '[x]'$ Refused Recommended Disposition /'[]'$ [place] [facility]/ '[]'$  Follow-up with PCP Additional Notes: Pt returned call for results/advise recent coloscopy; recommendations reviewed with pt. Mentioned needing something for anxiety, triaged. Made aware she would probably need appt, states she had OV(09/02/21) Discussed at [place]. Declined appt. Questioning if something can be called in for anxiety. Please advise.  Care advise provided, verbalizes understanding. Reason for Disposition  [1] Started on anti-anxiety medication AND [2] no relief  Answer Assessment - Initial Assessment Questions 1. CONCERN: "Did anything happen that prompted you to call today?"      Nothing 2. ANXIETY SYMPTOMS: "Can you describe how you (your loved one; patient) have been feeling?" (e.g., tense, restless, panicky, anxious, keyed up, overwhelmed, sense of impending doom).      *No Answer* 3. ONSET: "How long have you been feeling this way?" (e.g., hours, days, weeks)     Couple days 4. SEVERITY: "How would you rate the level of anxiety?" (e.g., 0 - 10; or mild, moderate, severe).     10/10 5. FUNCTIONAL IMPAIRMENT: "How have these feelings affected your ability to do daily activities?" "Have you had more difficulty than usual doing your normal daily activities?" (e.g., getting better, same, worse; self-care, school, work, interactions)     Can do all 6. HISTORY: "Have you felt this way before?" "Have you ever been diagnosed with an anxiety problem in the past?" (e.g., generalized anxiety disorder, panic attacks, PTSD). If Yes, ask: "How was this problem treated?" (e.g., medicines,  counseling, etc.)      7. RISK OF HARM - SUICIDAL IDEATION: "Do you ever have thoughts of hurting or killing yourself?" If Yes, ask:  "Do you have these feelings now?" "Do you have a plan on how you would do this?"     No 8. TREATMENT:  "What has been done so far to treat this anxiety?" (e.g., medicines, relaxation strategies). "What has helped?"     Ativan before 9. TREATMENT - THERAPIST: "Do you have a counselor or therapist? Name?"     no 10. POTENTIAL TRIGGERS: "Do you drink caffeinated beverages (e.g., coffee, colas, teas), and how much daily?" "Do you drink alcohol or use any drugs?" "Have you started any new medicines recently?"     no 10. PATIENT SUPPORT: "Who is with you now?" "Who do you live with?" "Do you have family or friends who you can talk to?"        Family 13. OTHER SYMPTOMS: "Do you have any other symptoms?" (e.g., feeling depressed, trouble concentrating, trouble sleeping, trouble breathing, palpitations or fast heartbeat, chest pain, sweating, nausea, or diarrhea)       Trouble sleeping  Protocols used: Anxiety and Panic Attack-A-AH

## 2021-09-15 NOTE — Progress Notes (Signed)
Please see TN note from 09/15/2021

## 2021-09-15 NOTE — Progress Notes (Signed)
LMTCB-Ok for [facility] Nurse to give results and providers advise. Thanks.

## 2021-09-15 NOTE — Telephone Encounter (Signed)
Called patient back and she did not answer again. I will send her a MyChart message.

## 2021-09-18 ENCOUNTER — Ambulatory Visit: Payer: Self-pay

## 2021-09-18 NOTE — Telephone Encounter (Signed)
  Chief Complaint: Anxiety Symptoms: Anxiety Frequency: since yesterday Pertinent Negatives: Patient denies SI Disposition: '[]'$ ED /'[]'$ Urgent Care (no appt availability in office) / '[x]'$ Appointment(In office/virtual)/ '[]'$  [facility]/ '[]'$ Home Care/ '[]'$ Refused Recommended Disposition /'[]'$ [facility]/ '[]'$  Follow-up with PCP Additional Notes: Pt is having a lot of anxiety since yesterday.  Pt would like to have medication called in for this. In the past taken lorazepam and would like this now. Please return pt's call.  Appointment made for 5/30 video needs to be a phone appointment if possible. If this cannot be done please call pt to reschedule.   Reason for Disposition  [1] Symptoms of anxiety or panic attack AND [2] is a chronic symptom (recurrent or ongoing AND present > 4 weeks)  Answer Assessment - Initial Assessment Questions 1. CONCERN: "Did anything happen that prompted you to call today?"      no 2. ANXIETY SYMPTOMS: "Can you describe how you (your loved one; patient) have been feeling?" (e.g., tense, restless, panicky, anxious, keyed up, overwhelmed, sense of impending doom).      Anxious 3. ONSET: "How long have you been feeling this way?" (e.g., hours, days, weeks)     yesterday 4. SEVERITY: "How would you rate the level of anxiety?" (e.g., 0 - 10; or mild, moderate, severe).     moderate 5. FUNCTIONAL IMPAIRMENT: "How have these feelings affected your ability to do daily activities?" "Have you had more difficulty than usual doing your normal daily activities?" (e.g., getting better, same, worse; self-care, school, work, interactions)      6. HISTORY: "Have you felt this way before?" "Have you ever been diagnosed with an anxiety problem in the past?" (e.g., generalized anxiety disorder, panic attacks, PTSD). If Yes, ask: "How was this problem treated?" (e.g., medicines, counseling, etc.)     yes 7. RISK OF HARM - SUICIDAL IDEATION: "Do you ever have thoughts of  hurting or killing yourself?" If Yes, ask:  "Do you have these feelings now?" "Do you have a plan on how you would do this?"     no 8. TREATMENT:  "What has been done so far to treat this anxiety?" (e.g., medicines, relaxation strategies). "What has helped?"     Medication 9. TREATMENT - THERAPIST: "Do you have a counselor or therapist? Name?"     no 10. POTENTIAL TRIGGERS: "Do you drink caffeinated beverages (e.g., coffee, colas, teas), and how much daily?" "Do you drink alcohol or use any drugs?" "Have you started any new medicines recently?"      10. PATIENT SUPPORT: "Who is with you now?" "Who do you live with?" "Do you have family or friends who you can talk to?"         11. OTHER SYMPTOMS: "Do you have any other symptoms?" (e.g., feeling depressed, trouble concentrating, trouble sleeping, trouble breathing, palpitations or fast heartbeat, chest pain, sweating, nausea, or diarrhea)        12. PREGNANCY: "Is there any chance you are pregnant?" "When was your last menstrual period?"       Na  Protocols used: Anxiety and Panic Attack-A-AH

## 2021-09-19 ENCOUNTER — Other Ambulatory Visit: Payer: Self-pay | Admitting: Family Medicine

## 2021-09-22 ENCOUNTER — Ambulatory Visit (INDEPENDENT_AMBULATORY_CARE_PROVIDER_SITE_OTHER): Payer: 59 | Admitting: Family Medicine

## 2021-09-22 DIAGNOSIS — F419 Anxiety disorder, unspecified: Secondary | ICD-10-CM

## 2021-09-22 DIAGNOSIS — R69 Illness, unspecified: Secondary | ICD-10-CM | POA: Diagnosis not present

## 2021-09-22 MED ORDER — HYDROXYZINE HCL 10 MG PO TABS: 10.0000 mg | ORAL_TABLET | Freq: Every day | ORAL | 0 refills | Status: DC | PRN

## 2021-09-22 NOTE — Progress Notes (Signed)
Virtual telephone visit    Virtual Visit via Telephone Note   This visit type was conducted due to national recommendations for restrictions regarding the COVID-19 Pandemic (e.g. social distancing) in an effort to limit this patient's exposure and mitigate transmission in our community. Due to her co-morbid illnesses, this patient is at least at moderate risk for complications without adequate follow up. This format is felt to be most appropriate for this patient at this time. The patient did not have access to video technology or had technical difficulties with video requiring transitioning to audio format only (telephone). Physical exam was limited to content and character of the telephone converstion.    Patient location: home Provider location:  [facility] [address]  [address] [place], [place] [address]   I discussed the limitations of evaluation and management by telemedicine and the availability of in person appointments. The patient expressed understanding and agreed to proceed.   Visit Date: 09/22/2021  Today's healthcare provider: Gwyneth Sprout, FNP   I,Tiffany J Bragg,acting as a scribe for Gwyneth Sprout, FNP.,have documented all relevant documentation on the behalf of Gwyneth Sprout, FNP,as directed by  Gwyneth Sprout, FNP while in the presence of Gwyneth Sprout, FNP.   Chief Complaint  Patient presents with   Anxiety   Subjective    HPI  Patient reports anxiety following results from recent colon cancer screening.       09/22/2021   10:56 AM 09/02/2021    3:36 PM 04/23/2021    3:04 PM 02/27/2018    4:47 PM  GAD 7 : Generalized Anxiety Score  Nervous, Anxious, on Edge 3 3 0 1  Control/stop worrying 3 0 0 0  Worry too much - different things 3 0 0 0  Trouble relaxing 3 0 0 0  Restless 3 0 0 1  Easily annoyed or irritable 3 0 0 0  Afraid - awful might happen 0 0 0 0  Total GAD 7 Score 18 3 0 2  Anxiety Difficulty Not difficult at all Not  difficult at all Not difficult at all Not difficult at all        09/22/2021   10:53 AM 09/02/2021    3:33 PM 07/29/2021    2:33 PM  PHQ9 SCORE ONLY  PHQ-9 Total Score _0 Medications: Outpatient Medications Prior to Visit  Medication Sig   ARIPiprazole (ABILIFY) 2 MG tablet Take 1 tablet (2 mg total) by mouth daily. To begin following 1/2 of 5 mg dose wean.   aspirin 81 MG tablet Take 81 mg by mouth every evening.    cholecalciferol (VITAMIN D3) 25 MCG (1000 UNIT) tablet Take 2,000 Units by mouth daily.   desvenlafaxine (PRISTIQ) 100 MG 24 hr tablet TAKE 1 TABLET BY MOUTH ONCE A DAY   glipiZIDE (GLUCOTROL) 10 MG tablet Take 1 tablet (10 mg total) by mouth 2 (two) times daily before a meal.   levothyroxine (SYNTHROID) 125 MCG tablet Take 1 tablet (125 mcg total) by mouth daily. Repeat labs prior to refill of this medication   lisinopril-hydrochlorothiazide (ZESTORETIC) 20-25 MG tablet Take 1 tablet by mouth daily.   metFORMIN (GLUCOPHAGE-XR) 750 MG 24 hr tablet Take 1 tablet (750 mg total) by mouth 2 (two) times daily with breakfast and lunch.   metoprolol succinate (TOPROL-XL) 25 MG 24 hr tablet Take 1 tablet (25 mg total) by mouth daily.   naproxen sodium (ANAPROX) 220 MG  tablet Take 440 mg by mouth daily as needed (pain).   simvastatin (ZOCOR) 40 MG tablet Take 1 tablet (40 mg total) by mouth at bedtime.   traZODone (DESYREL) 50 MG tablet Take 1 tablet (50 mg total) by mouth at bedtime as needed for sleep. (Patient taking differently: Take 25 mg by mouth every other day.)   No facility-administered medications prior to visit.    Review of Systems  Psychiatric/Behavioral:  Negative for suicidal ideas. The patient is nervous/anxious.    Last CBC Lab Results  Component Value Date   WBC 9.1 07/11/2020   HGB 16.5 (H) 07/11/2020   HCT 48.3 (H) 07/11/2020   MCV 90 07/11/2020   MCH 30.9 07/11/2020   RDW 12.3 07/11/2020   PLT 162 48/04/6551   Last metabolic panel Lab  Results  Component Value Date   GLUCOSE 285 (H) 07/29/2021   NA 138 07/29/2021   K 4.4 07/29/2021   CL 98 07/29/2021   CO2 25 07/29/2021   BUN 12 07/29/2021   CREATININE 0.87 07/29/2021   EGFR 75 07/29/2021   CALCIUM 9.6 07/29/2021   PROT 6.4 07/29/2021   ALBUMIN 4.3 07/29/2021   LABGLOB 2.1 07/29/2021   AGRATIO 2.0 07/29/2021   BILITOT 0.6 07/29/2021   ALKPHOS 84 07/29/2021   AST 20 07/29/2021   ALT 23 07/29/2021   ANIONGAP 9 05/10/2016   Last lipids Lab Results  Component Value Date   CHOL 172 01/26/2021   HDL 40 01/26/2021   LDLCALC 99 01/26/2021   TRIG 189 (H) 01/26/2021   CHOLHDL 4.3 01/26/2021   Last hemoglobin A1c Lab Results  Component Value Date   HGBA1C 9.4 (H) 07/29/2021   Last thyroid functions Lab Results  Component Value Date   TSH 0.030 (L) 07/29/2021   Last vitamin D Lab Results  Component Value Date   VD25OH 34.4 09/07/2019   Last vitamin B12 and Folate No results found for: VITAMINB12, FOLATE     Objective    LMP 04/27/2007  BP Readings from Last 3 Encounters:  09/09/21 116/62  09/08/21 (!) 116/57  09/02/21 100/66   Wt Readings from Last 3 Encounters:  09/09/21 200 lb 6.4 oz (90.9 kg)  09/08/21 200 lb 1.6 oz (90.8 kg)  09/02/21 200 lb (90.7 kg)   SpO2 Readings from Last 3 Encounters:  09/09/21 100%  07/03/18 97%  01/30/18 97%         Assessment & Plan     Problem List Items Addressed This Visit       Other   Anxiety - Primary    Chronic, recent exacerbation Previous misuse with overuse of benzo Will avoid use due to age Recommend low dose atarax trial Encourage counseling Encourage time outdoors, performing hobbies the patient enjoys and following healthful diet  RTC in 6 weeks for chronic care f/u Care discussed with patient's adult daughter as well, Colletta [place]        Relevant Medications   hydrOXYzine (ATARAX) 10 MG tablet     Return in about 5 weeks (around 10/27/2021) for anxiety and depression, chonic  disease management.    I discussed the assessment and treatment plan with the patient. The patient was provided an opportunity to ask questions and all were answered. The patient agreed with the plan and demonstrated an understanding of the instructions.   The patient was advised to call back or seek an in-person evaluation if the symptoms worsen or if the condition fails to improve as anticipated.  I provided  25 minutes of non-face-to-face time during this encounter.  Vonna Kotyk, FNP, have reviewed all documentation for this visit. The documentation on 09/22/21 for the exam, diagnosis, procedures, and orders are all accurate and complete.   Gwyneth Sprout, [place] [phone] (phone) [phone] (fax)  [place]

## 2021-09-22 NOTE — Assessment & Plan Note (Addendum)
Chronic, recent exacerbation Previous misuse with overuse of benzo Will avoid use due to age Recommend low dose atarax trial Encourage counseling Encourage time outdoors, performing hobbies the patient enjoys and following healthful diet  RTC in 6 weeks for chronic care f/u Care discussed with patient's adult daughter as well, Colletta [place]

## 2021-09-24 ENCOUNTER — Other Ambulatory Visit: Payer: Self-pay | Admitting: Family Medicine

## 2021-09-25 DIAGNOSIS — H903 Sensorineural hearing loss, bilateral: Secondary | ICD-10-CM | POA: Diagnosis not present

## 2021-09-25 DIAGNOSIS — H6122 Impacted cerumen, left ear: Secondary | ICD-10-CM | POA: Diagnosis not present

## 2021-09-29 ENCOUNTER — Other Ambulatory Visit: Payer: Self-pay | Admitting: Family Medicine

## 2021-09-29 DIAGNOSIS — G3184 Mild cognitive impairment, so stated: Secondary | ICD-10-CM

## 2021-10-05 ENCOUNTER — Other Ambulatory Visit: Payer: Self-pay

## 2021-10-05 ENCOUNTER — Telehealth: Payer: Self-pay | Admitting: Family Medicine

## 2021-10-05 DIAGNOSIS — I10 Essential (primary) hypertension: Secondary | ICD-10-CM

## 2021-10-05 MED ORDER — METOPROLOL SUCCINATE ER 25 MG PO TB24: 25.0000 mg | ORAL_TABLET | Freq: Every day | ORAL | 3 refills | Status: DC

## 2021-10-05 NOTE — Telephone Encounter (Signed)
[facility] faxed refill request for the following medications:  metoprolol succinate (TOPROL-XL) 25 MG 24 hr tablet   Please advise.

## 2021-10-06 DIAGNOSIS — R413 Other amnesia: Secondary | ICD-10-CM | POA: Diagnosis not present

## 2021-10-06 DIAGNOSIS — R69 Illness, unspecified: Secondary | ICD-10-CM | POA: Diagnosis not present

## 2021-10-23 NOTE — Progress Notes (Unsigned)
Established patient visit   Patient: Regina Pierce   DOB: [birth year]   66 y.o. Female  MRN: [id number] Visit Date: 10/28/2021  Today's healthcare provider: Gwyneth Sprout, FNP  Introduced to nurse practitioner role and practice setting.  All questions answered.  Discussed provider/patient relationship and expectations.   I,Salome Hautala J Pollie Poma,acting as a scribe for Gwyneth Sprout, FNP.,have documented all relevant documentation on the behalf of Gwyneth Sprout, FNP,as directed by  Gwyneth Sprout, FNP while in the presence of Gwyneth Sprout, FNP.   Chief Complaint  Patient presents with   Diabetes   Depression   Subjective    HPI  Diabetes Mellitus Type II, Follow-up  Lab Results  Component Value Date   HGBA1C 6.0 (A) 10/28/2021   HGBA1C 9.4 (H) 07/29/2021   HGBA1C 7.3 (A) 01/22/2021   Wt Readings from Last 3 Encounters:  10/28/21 204 lb (92.5 kg)  09/09/21 200 lb 6.4 oz (90.9 kg)  09/08/21 200 lb 1.6 oz (90.8 kg)   Last seen for diabetes 2 months ago.  Management since then includes starting Metformin XR 750 mg and Glipizide 10 mg. She reports excellent compliance with treatment. She is not having side effects.  Symptoms: No fatigue No foot ulcerations  No appetite changes No nausea  No paresthesia of the feet  No polydipsia  No polyuria No visual disturbances   No vomiting     Home blood sugar records:  not checked  Episodes of hypoglycemia? No  Most Recent Eye Exam: June 2023 Current exercise: none Current diet habits: in general, an "unhealthy" diet  Pertinent Labs: Lab Results  Component Value Date   CHOL 172 01/26/2021   HDL 40 01/26/2021   LDLCALC 99 01/26/2021   TRIG 189 (H) 01/26/2021   CHOLHDL 4.3 01/26/2021   Lab Results  Component Value Date   NA 138 07/29/2021   K 4.4 07/29/2021   CREATININE 0.87 07/29/2021   EGFR 75 07/29/2021   LABMICR 61.6 09/02/2021      ---------------------------------------------------------------------------------------------------  Depression, Follow-up  She  was last seen for this 2 months ago. Changes made at last visit include no changes, no medication.   She reports excellent compliance with treatment. She is not having side effects.   She reports excellent tolerance of treatment. Current symptoms include: none She feels she is Unchanged since last visit.     09/22/2021   10:53 AM 09/02/2021    3:33 PM 07/29/2021    2:33 PM  Depression screen PHQ 2/9  Decreased Interest 1 0 0  Down, Depressed, Hopeless 0 0 0  PHQ - 2 Score 1 0 0  Altered sleeping '3 1 1  ' Tired, decreased energy 0 1 1  Change in appetite 3 1 0  Feeling bad or failure about yourself  0 0 0  Trouble concentrating 0 0 0  Moving slowly or fidgety/restless 1 0 0  Suicidal thoughts 0 0 0  PHQ-9 Score '8 3 2  ' Difficult doing work/chores Not difficult at all Not difficult at all Not difficult at all    -----------------------------------------------------------------------------------------   Medications: Outpatient Medications Prior to Visit  Medication Sig   ARIPiprazole (ABILIFY) 2 MG tablet Take 1 tablet (2 mg total) by mouth daily. To begin following 1/2 of 5 mg dose wean.   aspirin 81 MG tablet Take 81 mg by mouth every evening.    cholecalciferol (VITAMIN D3) 25 MCG (1000 UNIT) tablet Take 2,000 Units by mouth  daily.   desvenlafaxine (PRISTIQ) 100 MG 24 hr tablet TAKE 1 TABLET BY MOUTH ONCE A DAY   glipiZIDE (GLUCOTROL) 10 MG tablet Take 1 tablet (10 mg total) by mouth 2 (two) times daily before a meal.   levothyroxine (SYNTHROID) 125 MCG tablet TAKE 1 TABLET BY MOUTH DAILY. REPEAT LABS PRIOR TO REFILL OF THIS MEDICATION   metFORMIN (GLUCOPHAGE-XR) 750 MG 24 hr tablet Take 1 tablet (750 mg total) by mouth 2 (two) times daily with breakfast and lunch.   metoprolol succinate (TOPROL-XL) 25 MG 24 hr tablet Take 1 tablet (25 mg total) by  mouth daily.   naproxen sodium (ANAPROX) 220 MG tablet Take 440 mg by mouth daily as needed (pain).   simvastatin (ZOCOR) 40 MG tablet Take 1 tablet (40 mg total) by mouth at bedtime.   [DISCONTINUED] lisinopril-hydrochlorothiazide (ZESTORETIC) 20-25 MG tablet Take 1 tablet by mouth daily.   [DISCONTINUED] traZODone (DESYREL) 50 MG tablet Take 1 tablet (50 mg total) by mouth at bedtime as needed for sleep. (Patient taking differently: Take 25 mg by mouth every other day.)   No facility-administered medications prior to visit.    Review of Systems     Objective    BP 92/67 (BP Location: Left Arm, Patient Position: Sitting, Cuff Size: Normal)   Pulse 69   Temp 98.4 F (36.9 C) (Oral)   Resp 16   Ht '5\' 4"'  (1.626 m)   Wt 204 lb (92.5 kg)   LMP 04/27/2007   SpO2 97%   BMI 35.02 kg/m    Physical Exam Vitals and nursing note reviewed.  Constitutional:      General: She is not in acute distress.    Appearance: Normal appearance. She is obese. She is not ill-appearing, toxic-appearing or diaphoretic.  HENT:     Head: Normocephalic and atraumatic.     Right Ear: Decreased hearing noted.     Left Ear: Decreased hearing noted.  Cardiovascular:     Rate and Rhythm: Normal rate and regular rhythm.     Pulses: Normal pulses.     Heart sounds: Normal heart sounds. No murmur heard.    No friction rub. No gallop.  Pulmonary:     Effort: Pulmonary effort is normal. No respiratory distress.     Breath sounds: Normal breath sounds. No stridor. No wheezing, rhonchi or rales.  Chest:     Chest wall: No tenderness.  Abdominal:     General: Bowel sounds are normal.     Palpations: Abdomen is soft.  Musculoskeletal:        General: No swelling, tenderness, deformity or signs of injury. Normal range of motion.     Right lower leg: No edema.     Left lower leg: No edema.  Skin:    General: Skin is warm and dry.     Capillary Refill: Capillary refill takes less than 2 seconds.      Coloration: Skin is not jaundiced or pale.     Findings: No bruising, erythema, lesion or rash.  Neurological:     General: No focal deficit present.     Mental Status: She is alert and oriented to person, place, and time. Mental status is at baseline.     Cranial Nerves: No cranial nerve deficit.     Sensory: No sensory deficit.     Motor: No weakness.     Coordination: Coordination normal.  Psychiatric:        Mood and Affect: Mood normal.  Behavior: Behavior normal.        Thought Content: Thought content normal.        Judgment: Judgment normal.      Results for orders placed or performed in visit on 10/28/21  POCT glycosylated hemoglobin (Hb A1C)  Result Value Ref Range   Hemoglobin A1C 6.0 (A) 4.0 - 5.6 %   Est. average glucose Bld gHb Est-mCnc 126     Assessment & Plan     Problem List Items Addressed This Visit       Cardiovascular and Mediastinum   Hypertension associated with diabetes ([place])    Borderline hypotensive today Recommend titration of zestoretic, to 1/2 tablet and 6 week follow up Will send in new Rx for lower dose Goal <130/<80       Relevant Medications   lisinopril-hydrochlorothiazide (ZESTORETIC) 10-12.5 MG tablet     Endocrine   Hyperlipidemia associated with type 2 diabetes mellitus (HCC)    Chronic, stable Repeat LP recommend diet low in saturated fat and regular exercise - 30 min at least 5 times per week       Relevant Medications   lisinopril-hydrochlorothiazide (ZESTORETIC) 10-12.5 MG tablet   Other Relevant Orders   Lipid panel   Hypothyroidism    Chronic, repeat TSH and Free T4 due to overcorrection Continues to have complaints of sleep and mood disturbances      Relevant Orders   TSH + free T4   Type 2 diabetes mellitus with hyperglycemia, without long-term current use of insulin (HCC) - Primary    Chronic, improved Continue to recommend 3 month follow up of A1c Continue to recommend balanced, lower carb meals.  Smaller meal size, adding snacks. Choosing water as drink of choice and increasing purposeful exercise.       Relevant Medications   lisinopril-hydrochlorothiazide (ZESTORETIC) 10-12.5 MG tablet   Other Relevant Orders   POCT glycosylated hemoglobin (Hb A1C) (Completed)     Nervous and Auditory   Mild cognitive impairment    Patient seen by neuro Has switched Abilify to PM dosing Denies change in day time sleepiness Continue to recommend titration to 1 mg/day with goal of stopping Abilify  Previously on lexapro 2019, seroquel 2020 and effexor IR/XR 2016 Declines change in pristiq to another SSRI/SNRI at this time; will re-eval at 6 week HTN f/u        Return in about 6 weeks (around 12/09/2021) for HTN management.      Vonna Kotyk, FNP, have reviewed all documentation for this visit. The documentation on 10/28/21 for the exam, diagnosis, procedures, and orders are all accurate and complete.    Gwyneth Sprout, [place] [phone] (phone) [phone] (fax)  [place]

## 2021-10-26 ENCOUNTER — Other Ambulatory Visit: Payer: Self-pay | Admitting: Family Medicine

## 2021-10-26 DIAGNOSIS — E039 Hypothyroidism, unspecified: Secondary | ICD-10-CM

## 2021-10-28 ENCOUNTER — Encounter: Payer: Self-pay | Admitting: Family Medicine

## 2021-10-28 ENCOUNTER — Ambulatory Visit (INDEPENDENT_AMBULATORY_CARE_PROVIDER_SITE_OTHER): Payer: 59 | Admitting: Family Medicine

## 2021-10-28 VITALS — BP 92/67 | HR 69 | Temp 98.4°F | Resp 16 | Ht 64.0 in | Wt 204.0 lb

## 2021-10-28 DIAGNOSIS — I152 Hypertension secondary to endocrine disorders: Secondary | ICD-10-CM | POA: Diagnosis not present

## 2021-10-28 DIAGNOSIS — E1165 Type 2 diabetes mellitus with hyperglycemia: Secondary | ICD-10-CM

## 2021-10-28 DIAGNOSIS — E039 Hypothyroidism, unspecified: Secondary | ICD-10-CM | POA: Diagnosis not present

## 2021-10-28 DIAGNOSIS — E1169 Type 2 diabetes mellitus with other specified complication: Secondary | ICD-10-CM | POA: Diagnosis not present

## 2021-10-28 DIAGNOSIS — G3184 Mild cognitive impairment, so stated: Secondary | ICD-10-CM

## 2021-10-28 DIAGNOSIS — E785 Hyperlipidemia, unspecified: Secondary | ICD-10-CM | POA: Diagnosis not present

## 2021-10-28 DIAGNOSIS — E1159 Type 2 diabetes mellitus with other circulatory complications: Secondary | ICD-10-CM

## 2021-10-28 LAB — POCT GLYCOSYLATED HEMOGLOBIN (HGB A1C)
Est. average glucose Bld gHb Est-mCnc: 126
Hemoglobin A1C: 6 % — AB (ref 4.0–5.6)

## 2021-10-28 MED ORDER — LISINOPRIL-HYDROCHLOROTHIAZIDE 10-12.5 MG PO TABS: 1.0000 | ORAL_TABLET | Freq: Every day | ORAL | 11 refills | Status: DC

## 2021-10-28 MED ORDER — LISINOPRIL-HYDROCHLOROTHIAZIDE 10-12.5 MG PO TABS: 1.0000 | ORAL_TABLET | Freq: Every day | ORAL | 0 refills | Status: DC

## 2021-10-28 NOTE — Assessment & Plan Note (Signed)
Patient seen by neuro Has switched Abilify to PM dosing Denies change in day time sleepiness Continue to recommend titration to 1 mg/day with goal of stopping Abilify  Previously on lexapro 2019, seroquel 2020 and effexor IR/XR 2016 Declines change in pristiq to another SSRI/SNRI at this time; will re-eval at 6 week HTN f/u

## 2021-10-28 NOTE — Assessment & Plan Note (Signed)
Chronic, improved Continue to recommend 3 month follow up of A1c Continue to recommend balanced, lower carb meals. Smaller meal size, adding snacks. Choosing water as drink of choice and increasing purposeful exercise.

## 2021-10-28 NOTE — Assessment & Plan Note (Signed)
Chronic, stable Repeat LP recommend diet low in saturated fat and regular exercise - 30 min at least 5 times per week

## 2021-10-28 NOTE — Assessment & Plan Note (Signed)
Borderline hypotensive today Recommend titration of zestoretic, to 1/2 tablet and 6 week follow up Will send in new Rx for lower dose Goal <130/<80

## 2021-10-28 NOTE — Telephone Encounter (Signed)
Requested Prescriptions  Pending Prescriptions Disp Refills  . levothyroxine (SYNTHROID) 125 MCG tablet [Pharmacy Med Name: LEVOTHYROXINE SODIUM 125 MCG TAB] 90 tablet 0    Sig: TAKE 1 TABLET BY MOUTH DAILY. REPEAT LABS PRIOR TO REFILL OF THIS MEDICATION     Endocrinology:  Hypothyroid Agents Failed - 10/26/2021  1:29 PM      Failed - TSH in normal range and within 360 days    TSH  Date Value Ref Range Status  07/29/2021 0.030 (L) 0.450 - 4.500 uIU/mL Final         Passed - Valid encounter within last 12 months    Recent Outpatient Visits          1 month ago [place] T[facility] Joe T, FNP   1 month ago Type 2 diabetes mellitus with hyperglycemia, without long-term current use of insulin [facility])   [facility] Tally Joe T, FNP   3 months ago Depression, recurrent [facility])   [facility] Tally Joe T, FNP   6 months ago Hypertension associated with diabetes [facility])   [facility] Tally Joe T, FNP   9 months ago Hypertension associated with diabetes [facility])   [facility], Dionne Bucy, MD      Future Appointments            Today Gwyneth Sprout, [place], [facility]

## 2021-10-28 NOTE — Assessment & Plan Note (Signed)
Chronic, repeat TSH and Free T4 due to overcorrection Continues to have complaints of sleep and mood disturbances

## 2021-10-29 ENCOUNTER — Other Ambulatory Visit: Payer: Self-pay | Admitting: Family Medicine

## 2021-10-29 DIAGNOSIS — F411 Generalized anxiety disorder: Secondary | ICD-10-CM

## 2021-10-29 DIAGNOSIS — E039 Hypothyroidism, unspecified: Secondary | ICD-10-CM

## 2021-10-29 DIAGNOSIS — F339 Major depressive disorder, recurrent, unspecified: Secondary | ICD-10-CM

## 2021-10-29 LAB — LIPID PANEL
Chol/HDL Ratio: 4.1 ratio (ref 0.0–4.4)
Cholesterol, Total: 152 mg/dL (ref 100–199)
HDL: 37 mg/dL — ABNORMAL LOW (ref >39)
LDL Chol Calc (NIH): 86 mg/dL (ref 0–99)
Triglycerides: 167 mg/dL — ABNORMAL HIGH (ref 0–149)
VLDL Cholesterol Cal: 29 mg/dL (ref 5–40)

## 2021-10-29 LAB — TSH+FREE T4
Free T4: 1.47 ng/dL (ref 0.82–1.77)
TSH: 1.85 u[IU]/mL (ref 0.450–4.500)

## 2021-10-29 MED ORDER — LEVOTHYROXINE SODIUM 125 MCG PO TABS: 125.0000 ug | ORAL_TABLET | Freq: Every day | ORAL | 11 refills | Status: DC

## 2021-10-29 NOTE — Progress Notes (Signed)
Cholesterol improved; however, slight reduction in good cholesterol as well, the HDL. Recommend increase in diet of healthier fat choices- low fat meats, oils that are not solid at room temperature, nuts, seeds, fish- cod, halibut, salmon, and avocado. Exercise can also increase this number.  Supplemental omega 3's can be taken as well but are not as helpful as dietary/exercise changes.  Thyroid has stabilized. OK to continue current dose and repeat testing in 1 year.  Gwyneth Sprout, [address] [place], [place] [address] [phone] (phone) [phone] (fax) [place]

## 2021-11-04 DIAGNOSIS — G479 Sleep disorder, unspecified: Secondary | ICD-10-CM | POA: Diagnosis not present

## 2021-11-04 DIAGNOSIS — R413 Other amnesia: Secondary | ICD-10-CM | POA: Diagnosis not present

## 2021-11-04 DIAGNOSIS — R4189 Other symptoms and signs involving cognitive functions and awareness: Secondary | ICD-10-CM | POA: Diagnosis not present

## 2021-11-04 DIAGNOSIS — R69 Illness, unspecified: Secondary | ICD-10-CM | POA: Diagnosis not present

## 2021-11-13 ENCOUNTER — Encounter: Payer: Self-pay | Admitting: Family Medicine

## 2021-11-23 ENCOUNTER — Other Ambulatory Visit: Payer: Self-pay | Admitting: Family Medicine

## 2021-11-23 DIAGNOSIS — E039 Hypothyroidism, unspecified: Secondary | ICD-10-CM

## 2021-11-23 MED ORDER — LEVOTHYROXINE SODIUM 100 MCG PO TABS: 100.0000 ug | ORAL_TABLET | Freq: Every day | ORAL | 11 refills | Status: DC

## 2021-12-09 ENCOUNTER — Ambulatory Visit: Payer: 59 | Admitting: Family Medicine

## 2021-12-29 ENCOUNTER — Encounter: Payer: Self-pay | Admitting: Family Medicine

## 2021-12-29 DIAGNOSIS — E039 Hypothyroidism, unspecified: Secondary | ICD-10-CM

## 2021-12-29 DIAGNOSIS — E1165 Type 2 diabetes mellitus with hyperglycemia: Secondary | ICD-10-CM

## 2021-12-31 ENCOUNTER — Telehealth: Payer: Self-pay | Admitting: Family Medicine

## 2021-12-31 ENCOUNTER — Other Ambulatory Visit: Payer: Self-pay | Admitting: Family Medicine

## 2021-12-31 DIAGNOSIS — F339 Major depressive disorder, recurrent, unspecified: Secondary | ICD-10-CM

## 2021-12-31 MED ORDER — DESVENLAFAXINE SUCCINATE ER 25 MG PO TB24: 50.0000 mg | ORAL_TABLET | Freq: Every day | ORAL | 1 refills | Status: DC

## 2021-12-31 MED ORDER — DESVENLAFAXINE SUCCINATE ER 25 MG PO TB24: ORAL_TABLET | ORAL | 1 refills | Status: DC

## 2021-12-31 NOTE — Telephone Encounter (Signed)
Spoke with pharmacy, they said they will put that on the script.

## 2021-12-31 NOTE — Telephone Encounter (Signed)
Pharmacy seeking clarity regarding directions for desvenlafaxine (PRISTIQ) 25 MG 24 hr tablet. Pharmacy states direction reflects quantity of 30 and take 2 tablets daily but the directions do not match.  The directions should be quantity of 60. Pharmacy requesting new script    [place], [place] Phone:  [phone]  Fax:  [phone]

## 2022-01-22 ENCOUNTER — Other Ambulatory Visit: Payer: Self-pay | Admitting: Family Medicine

## 2022-01-22 NOTE — Telephone Encounter (Signed)
Requested medication (s) are due for refill today - no  Requested medication (s) are on the active medication list -yes- but not at this dosing  Future visit scheduled -no  Last refill: desvenlafaxine '25mg'$  #30 1RF- titration dosing  Notes to clinic: requested dose no longer current dosing  Requested Prescriptions  Pending Prescriptions Disp Refills   desvenlafaxine (PRISTIQ) 100 MG 24 hr tablet [Pharmacy Med Name: DESVENLAFAXINE SUCCINATE ER 100 MG] 90 tablet 0    Sig: TAKE 1 TABLET BY MOUTH ONCE A DAY     Psychiatry: Antidepressants - SNRI - desvenlafaxine & venlafaxine Failed - 01/22/2022 11:51 AM      Failed - Lipid Panel in normal range within the last 12 months    Cholesterol, Total  Date Value Ref Range Status  10/28/2021 152 100 - 199 mg/dL Final   LDL Chol Calc (NIH)  Date Value Ref Range Status  10/28/2021 86 0 - 99 mg/dL Final   HDL  Date Value Ref Range Status  10/28/2021 37 (L) >39 mg/dL Final   Triglycerides  Date Value Ref Range Status  10/28/2021 167 (H) 0 - 149 mg/dL Final         Passed - Cr in normal range and within 360 days    Creatinine  Date Value Ref Range Status  09/10/2011 0.98 0.60 - 1.30 mg/dL Final   Creatinine, Ser  Date Value Ref Range Status  07/29/2021 0.87 0.57 - 1.00 mg/dL Final         Passed - Completed PHQ-2 or PHQ-9 in the last 360 days      Passed - Last BP in normal range    BP Readings from Last 1 Encounters:  10/28/21 92/67         Passed - Valid encounter within last 6 months    Recent Outpatient Visits           2 months ago Type 2 diabetes mellitus with hyperglycemia, without long-term current use of insulin ([place])   [facility] Gwyneth Sprout, FNP   4 months ago [place] Tally Joe T, FNP   4 months ago Type 2 diabetes mellitus with hyperglycemia, without long-term current use of insulin [facility])   [facility] Tally Joe T, FNP   5 months ago  Depression, recurrent [facility])   [facility] Tally Joe T, FNP   9 months ago Hypertension associated with diabetes [facility])   [facility] Tally Joe T, FNP                 Requested Prescriptions  Pending Prescriptions Disp Refills   desvenlafaxine (PRISTIQ) 100 MG 24 hr tablet [Pharmacy Med Name: DESVENLAFAXINE SUCCINATE ER 100 MG] 90 tablet 0    Sig: TAKE 1 TABLET BY MOUTH ONCE A DAY     Psychiatry: Antidepressants - SNRI - desvenlafaxine & venlafaxine Failed - 01/22/2022 11:51 AM      Failed - Lipid Panel in normal range within the last 12 months    Cholesterol, Total  Date Value Ref Range Status  10/28/2021 152 100 - 199 mg/dL Final   LDL Chol Calc (NIH)  Date Value Ref Range Status  10/28/2021 86 0 - 99 mg/dL Final   HDL  Date Value Ref Range Status  10/28/2021 37 (L) >39 mg/dL Final   Triglycerides  Date Value Ref Range Status  10/28/2021 167 (H) 0 - 149 mg/dL Final  Passed - Cr in normal range and within 360 days    Creatinine  Date Value Ref Range Status  09/10/2011 0.98 0.60 - 1.30 mg/dL Final   Creatinine, Ser  Date Value Ref Range Status  07/29/2021 0.87 0.57 - 1.00 mg/dL Final         Passed - Completed PHQ-2 or PHQ-9 in the last 360 days      Passed - Last BP in normal range    BP Readings from Last 1 Encounters:  10/28/21 92/67         Passed - Valid encounter within last 6 months    Recent Outpatient Visits           2 months ago Type 2 diabetes mellitus with hyperglycemia, without long-term current use of insulin [facility])   [facility] Gwyneth Sprout, FNP   4 months ago [place] Tally Joe T, FNP   4 months ago Type 2 diabetes mellitus with hyperglycemia, without long-term current use of insulin [facility])   [facility] Tally Joe T, FNP   5 months ago Depression, recurrent [facility]   [facility] Tally Joe T, FNP   9 months ago  Hypertension associated with diabetes [facility])   [facility] Gwyneth Sprout, FNP

## 2022-01-25 ENCOUNTER — Other Ambulatory Visit: Payer: Self-pay | Admitting: Family Medicine

## 2022-01-28 ENCOUNTER — Telehealth: Payer: Self-pay | Admitting: Family Medicine

## 2022-01-28 NOTE — Telephone Encounter (Signed)
The patient called in stating she needs to talk to her provider about getting a prior auth for desvenlafaxine (PRISTIQ) 25 MG 24 hr tablet that was prescribed. She states her insurance which is [employer] will not cover it unless they hear from the provider about why she needs it. Please assist patient further

## 2022-01-28 NOTE — Telephone Encounter (Signed)
PA was created. Spoke with daughter and she stated she has been paying for it out of pocket, but her mom does not know that.

## 2022-01-30 ENCOUNTER — Other Ambulatory Visit: Payer: Self-pay | Admitting: Family Medicine

## 2022-01-30 ENCOUNTER — Encounter: Payer: Self-pay | Admitting: Family Medicine

## 2022-01-30 DIAGNOSIS — I152 Hypertension secondary to endocrine disorders: Secondary | ICD-10-CM

## 2022-01-30 DIAGNOSIS — E1165 Type 2 diabetes mellitus with hyperglycemia: Secondary | ICD-10-CM

## 2022-02-03 ENCOUNTER — Other Ambulatory Visit: Payer: Self-pay | Admitting: Family Medicine

## 2022-02-03 DIAGNOSIS — E1165 Type 2 diabetes mellitus with hyperglycemia: Secondary | ICD-10-CM

## 2022-02-03 DIAGNOSIS — F339 Major depressive disorder, recurrent, unspecified: Secondary | ICD-10-CM

## 2022-02-03 MED ORDER — DESVENLAFAXINE SUCCINATE ER 25 MG PO TB24: ORAL_TABLET | ORAL | 1 refills | Status: DC

## 2022-02-03 MED ORDER — GLIPIZIDE 5 MG PO TABS: 5.0000 mg | ORAL_TABLET | Freq: Every day | ORAL | 2 refills | Status: DC

## 2022-02-04 ENCOUNTER — Other Ambulatory Visit: Payer: Self-pay | Admitting: Family Medicine

## 2022-02-04 DIAGNOSIS — F339 Major depressive disorder, recurrent, unspecified: Secondary | ICD-10-CM

## 2022-02-04 MED ORDER — DESVENLAFAXINE SUCCINATE ER 25 MG PO TB24: ORAL_TABLET | ORAL | 1 refills | Status: DC

## 2022-02-18 NOTE — Progress Notes (Signed)
Established patient visit   Patient: Regina Pierce   DOB: [birth year]   66 y.o. Female  MRN: [id number] Visit Date: 02/19/2022  Today's healthcare provider: Gwyneth Sprout, FNP  Re Introduced to nurse practitioner role and practice setting.  All questions answered.  Discussed provider/patient relationship and expectations.  I,Tiffany J Bragg,acting as a scribe for Gwyneth Sprout, FNP.,have documented all relevant documentation on the behalf of Gwyneth Sprout, FNP,as directed by  Gwyneth Sprout, FNP while in the presence of Gwyneth Sprout, FNP.   Chief Complaint  Patient presents with   Diabetes   Subjective    HPI  Diabetes Mellitus Type II, Follow-up  Lab Results  Component Value Date   HGBA1C 6.0 (A) 10/28/2021   HGBA1C 9.4 (H) 07/29/2021   HGBA1C 7.3 (A) 01/22/2021   Wt Readings from Last 3 Encounters:  02/19/22 210 lb (95.3 kg)  10/28/21 204 lb (92.5 kg)  09/09/21 200 lb 6.4 oz (90.9 kg)   Last seen for diabetes 4 months ago.  Management since then includes titrating zestoretic. She reports excellent compliance with treatment. She is not having side effects.  Symptoms: No fatigue No foot ulcerations  No appetite changes No nausea  No paresthesia of the feet  No polydipsia  No polyuria No visual disturbances   No vomiting     Home blood sugar records:  not checked  Episodes of hypoglycemia? No    Current insulin regiment:  Most Recent Eye Exam: June 2023 Current exercise: none Current diet habits: in general, an "unhealthy" diet  Pertinent Labs: Lab Results  Component Value Date   CHOL 152 10/28/2021   HDL 37 (L) 10/28/2021   LDLCALC 86 10/28/2021   TRIG 167 (H) 10/28/2021   CHOLHDL 4.1 10/28/2021   Lab Results  Component Value Date   NA 138 07/29/2021   K 4.4 07/29/2021   CREATININE 0.87 07/29/2021   EGFR 75 07/29/2021   LABMICR 61.6 09/02/2021      ---------------------------------------------------------------------------------------------------   Medications: Outpatient Medications Prior to Visit  Medication Sig   aspirin 81 MG tablet Take 81 mg by mouth every evening.    cholecalciferol (VITAMIN D3) 25 MCG (1000 UNIT) tablet Take 2,000 Units by mouth daily.   desvenlafaxine (PRISTIQ) 25 MG 24 hr tablet Medication titration: Take 1 month of 50 mg (2-25 mg tablets) daily, 1 month of 25 mg tablets daily, and then take 25 mg every third day until completed. Report symptoms to PCP during titration with goal of wean.   glipiZIDE (GLUCOTROL) 5 MG tablet Take 1 tablet (5 mg total) by mouth daily before breakfast.   levothyroxine (SYNTHROID) 100 MCG tablet Take 1 tablet (100 mcg total) by mouth daily.   lisinopril-hydrochlorothiazide (ZESTORETIC) 10-12.5 MG tablet TAKE 1 TABLET BY MOUTH ONCE A DAY   metFORMIN (GLUCOPHAGE-XR) 750 MG 24 hr tablet TAKE 1 TABLET BY MOUTH 2 TIMES DAILY WITH BREAKFAST AND LUNCH   naproxen sodium (ANAPROX) 220 MG tablet Take 440 mg by mouth daily as needed (pain).   simvastatin (ZOCOR) 40 MG tablet Take 1 tablet (40 mg total) by mouth at bedtime.   No facility-administered medications prior to visit.    Review of Systems   Objective    BP (!) 113/58 (BP Location: Right Arm, Patient Position: Sitting, Cuff Size: Large)   Pulse 91   Resp 16   Ht '5\' 4"'  (1.626 m)   Wt 210 lb (95.3 kg)   LMP 04/27/2007  SpO2 98%   BMI 36.05 kg/m   Physical Exam Vitals and nursing note reviewed.  Constitutional:      General: She is not in acute distress.    Appearance: Normal appearance. She is obese. She is not ill-appearing, toxic-appearing or diaphoretic.  HENT:     Head: Normocephalic and atraumatic.  Cardiovascular:     Rate and Rhythm: Normal rate and regular rhythm.     Pulses: Normal pulses.     Heart sounds: Normal heart sounds. No murmur heard.    No friction rub. No gallop.  Pulmonary:     Effort:  Pulmonary effort is normal. No respiratory distress.     Breath sounds: Normal breath sounds. No stridor. No wheezing, rhonchi or rales.  Chest:     Chest wall: No tenderness.  Musculoskeletal:        General: No swelling, tenderness, deformity or signs of injury. Normal range of motion.     Right lower leg: No edema.     Left lower leg: No edema.       Feet:  Skin:    General: Skin is warm and dry.     Capillary Refill: Capillary refill takes less than 2 seconds.     Coloration: Skin is not jaundiced or pale.     Findings: No bruising, erythema, lesion or rash.  Neurological:     General: No focal deficit present.     Mental Status: She is alert and oriented to person, place, and time. Mental status is at baseline.     Cranial Nerves: No cranial nerve deficit.     Sensory: No sensory deficit.     Motor: No weakness.     Coordination: Coordination normal.  Psychiatric:        Mood and Affect: Mood normal.        Behavior: Behavior normal.        Thought Content: Thought content normal.        Judgment: Judgment normal.      No results found for any visits on 02/19/22.  Assessment & Plan     Problem List Items Addressed This Visit       Cardiovascular and Mediastinum   PAD (peripheral artery disease) ([place])    Chronic, stable Have discussed smoking cessation previously; pt declines quitting or any assistance  On ASA 81 mg only        Endocrine   Type 2 diabetes mellitus with hyperglycemia, without long-term current use of insulin (HCC) - Primary    Chronic, previously improved from 9.4 to 6% DM foot exam done- PAD noted DM eye exam requested Denies any side effects or complications  Continue glipizde 5 Metformin xr 750 BID  On statin; on ace        Other   Smoking addiction    Due for low risk CT scan Reports 1-3 ppd/50 years Declines smoking cessation at this time       Relevant Orders   CT CHEST LUNG CA SCREEN LOW DOSE W/O CM   Return in about 3  months (around 05/22/2022) for annual examination.     Vonna Kotyk, FNP, have reviewed all documentation for this visit. The documentation on 02/19/22 for the exam, diagnosis, procedures, and orders are all accurate and complete.  Gwyneth Sprout, [place] [phone] (phone) [phone] (fax)  [facility]

## 2022-02-19 ENCOUNTER — Ambulatory Visit: Payer: 59 | Admitting: Family Medicine

## 2022-02-19 ENCOUNTER — Encounter: Payer: Self-pay | Admitting: Family Medicine

## 2022-02-19 VITALS — BP 113/58 | HR 91 | Resp 16 | Ht 64.0 in | Wt 210.0 lb

## 2022-02-19 DIAGNOSIS — I739 Peripheral vascular disease, unspecified: Secondary | ICD-10-CM | POA: Diagnosis not present

## 2022-02-19 DIAGNOSIS — E1165 Type 2 diabetes mellitus with hyperglycemia: Secondary | ICD-10-CM

## 2022-02-19 DIAGNOSIS — E039 Hypothyroidism, unspecified: Secondary | ICD-10-CM | POA: Diagnosis not present

## 2022-02-19 DIAGNOSIS — F17200 Nicotine dependence, unspecified, uncomplicated: Secondary | ICD-10-CM | POA: Insufficient documentation

## 2022-02-19 DIAGNOSIS — R69 Illness, unspecified: Secondary | ICD-10-CM | POA: Diagnosis not present

## 2022-02-19 LAB — HM DIABETES EYE EXAM

## 2022-02-19 NOTE — Assessment & Plan Note (Addendum)
Chronic, stable Have discussed smoking cessation previously; pt declines quitting or any assistance  On ASA 81 mg only

## 2022-02-19 NOTE — Assessment & Plan Note (Signed)
Due for low risk CT scan Reports 1-3 ppd/50 years Declines smoking cessation at this time

## 2022-02-19 NOTE — Assessment & Plan Note (Signed)
Chronic, previously improved from 9.4 to 6% DM foot exam done- PAD noted DM eye exam requested Denies any side effects or complications  Continue glipizde 5 Metformin xr 750 BID  On statin; on ace

## 2022-02-20 LAB — TSH+FREE T4
Free T4: 1.57 ng/dL (ref 0.82–1.77)
TSH: 8.91 u[IU]/mL — ABNORMAL HIGH (ref 0.450–4.500)

## 2022-02-20 LAB — CBC WITH DIFFERENTIAL/PLATELET
Basophils Absolute: 0.1 10*3/uL (ref 0.0–0.2)
Basos: 1 %
EOS (ABSOLUTE): 0.2 10*3/uL (ref 0.0–0.4)
Eos: 2 %
Hematocrit: 46.1 % (ref 34.0–46.6)
Hemoglobin: 15.6 g/dL (ref 11.1–15.9)
Immature Grans (Abs): 0 10*3/uL (ref 0.0–0.1)
Immature Granulocytes: 0 %
Lymphocytes Absolute: 2.2 10*3/uL (ref 0.7–3.1)
Lymphs: 25 %
MCH: 31.2 pg (ref 26.6–33.0)
MCHC: 33.8 g/dL (ref 31.5–35.7)
MCV: 92 fL (ref 79–97)
Monocytes Absolute: 0.5 10*3/uL (ref 0.1–0.9)
Monocytes: 6 %
Neutrophils Absolute: 5.9 10*3/uL (ref 1.4–7.0)
Neutrophils: 66 %
Platelets: 193 10*3/uL (ref 150–450)
RBC: 5 x10E6/uL (ref 3.77–5.28)
RDW: 12.3 % (ref 11.7–15.4)
WBC: 8.8 10*3/uL (ref 3.4–10.8)

## 2022-02-20 LAB — COMPREHENSIVE METABOLIC PANEL
ALT: 19 IU/L (ref 0–32)
AST: 17 IU/L (ref 0–40)
Albumin/Globulin Ratio: 1.7 (ref 1.2–2.2)
Albumin: 4.2 g/dL (ref 3.9–4.9)
Alkaline Phosphatase: 80 IU/L (ref 44–121)
BUN/Creatinine Ratio: 18 (ref 12–28)
BUN: 16 mg/dL (ref 8–27)
Bilirubin Total: 0.6 mg/dL (ref 0.0–1.2)
CO2: 26 mmol/L (ref 20–29)
Calcium: 9.7 mg/dL (ref 8.7–10.3)
Chloride: 100 mmol/L (ref 96–106)
Creatinine, Ser: 0.91 mg/dL (ref 0.57–1.00)
Globulin, Total: 2.5 g/dL (ref 1.5–4.5)
Glucose: 91 mg/dL (ref 70–99)
Potassium: 4.5 mmol/L (ref 3.5–5.2)
Sodium: 141 mmol/L (ref 134–144)
Total Protein: 6.7 g/dL (ref 6.0–8.5)
eGFR: 70 mL/min/{1.73_m2} (ref >59)

## 2022-02-20 LAB — HEMOGLOBIN A1C
Est. average glucose Bld gHb Est-mCnc: 123 mg/dL
Hgb A1c MFr Bld: 5.9 % — ABNORMAL HIGH (ref 4.8–5.6)

## 2022-02-21 ENCOUNTER — Other Ambulatory Visit: Payer: Self-pay | Admitting: Family Medicine

## 2022-02-21 NOTE — Progress Notes (Signed)
Recommend alternation of 125 mcg and 100 mcg synthroid as previously overcorrected on 125 and now labs show undercorrection on 100 mcg. Ensure taken with water, prior to other foods or meds. Let me know if you need this dose again.  Regina Pierce, [address] [place], [place] [address] [phone] (phone) [phone] (fax) [place]

## 2022-02-24 ENCOUNTER — Other Ambulatory Visit: Payer: Self-pay | Admitting: Family Medicine

## 2022-02-24 MED ORDER — LEVOTHYROXINE SODIUM 112 MCG PO TABS: 112.0000 ug | ORAL_TABLET | Freq: Every day | ORAL | 0 refills | Status: DC

## 2022-03-08 ENCOUNTER — Ambulatory Visit: Payer: Self-pay | Admitting: *Deleted

## 2022-03-08 ENCOUNTER — Encounter: Payer: Self-pay | Admitting: Family Medicine

## 2022-03-08 NOTE — Telephone Encounter (Signed)
Message from Nani Ravens sent at 03/08/2022 12:18 PM EST  Summary: diabetic pt. feet cold and swollen   Pt's daughter called in for assistance. (My-chart message sent) daughter says that pt is a diabetic and her feet are swollen and cold. Pt doesn't have swelling anywhere else on her body.          Call History   Type Contact Phone/Fax User  03/08/2022 12:13 PM EST Phone (Incoming) [place] Ash,Stephanie (Emergency Contact) [phone] Wynetta Emery, Junie Panning C

## 2022-03-08 NOTE — Telephone Encounter (Signed)
ummary: diabetic pt. feet cold and swollen     Pt's daughter called in for assistance. (My-chart message sent) daughter says that pt is a diabetic and her feet are swollen and cold. Pt doesn't have swelling anywhere else on her body.     Reason for Disposition . [1] MODERATE leg swelling (e.g., swelling extends up to knees) AND [2] new-onset or worsening    Mild swelling but painful.  Answer Assessment - Initial Assessment Questions 1. ONSET: "When did the swelling start?" (e.g., minutes, hours, days)     2 days ago 2. LOCATION: "What part of the leg is swollen?"  "Are both legs swollen or just one leg?"     Left foot and ankle 3. SEVERITY: "How bad is the swelling?" (e.g., localized; mild, moderate, severe)   - Localized: Small area of swelling localized to one leg.   - MILD pedal edema: Swelling limited to foot and ankle, pitting edema < 1/4 inch (6 mm) deep, rest and elevation eliminate most or all swelling.   - MODERATE edema: Swelling of lower leg to knee, pitting edema > 1/4 inch (6 mm) deep, rest and elevation only partially reduce swelling.   - SEVERE edema: Swelling extends above knee, facial or hand swelling present.      Moderate 4. REDNESS: "Does the swelling look red or infected?"     No 5. PAIN: "Is the swelling painful to touch?" If Yes, ask: "How painful is it?"   (Scale 1-10; mild, moderate or severe)    Left foot 9/10 at times 6. FEVER: "Do you have a fever?" If Yes, ask: "What is it, how was it measured, and when did it start?"      no 7. CAUSE: "What do you think is causing the leg swelling?"     Unsure 8. MEDICAL HISTORY: "Do you have a history of blood clots (e.g., DVT), cancer, heart failure, kidney disease, or liver failure?"      9. RECURRENT SYMPTOM: "Have you had leg swelling before?" If Yes, ask: "When was the last time?" "What happened that time?"     No 10. OTHER SYMPTOMS: "Do you have any other symptoms?" (e.g., chest pain, difficulty breathing)        Both feet cold to touch, left foot painful  Protocols used: Leg Swelling and Edema-A-AH

## 2022-03-08 NOTE — Telephone Encounter (Signed)
  Chief Complaint: Foot swelling Symptoms: Pt's daughter calling initially, able to call and speak to pt.  Left foot swollen to ankle, mild ,able to walk, wear "Slippers" states " Both feet feel cold." Denies discoloration. 9/10 pain at times left foot. Frequency: 2 days Pertinent Negatives: Patient denies redness, warmth,SOB, injury. Disposition: '[]'$ ED /'[]'$ Urgent Care (no appt availability in office) / '[x]'$ Appointment(In office/virtual)/ '[]'$  [place] [facility]/ '[]'$ Home Care/ '[]'$ Refused Recommended Disposition /'[]'$ [place] [facility]/ '[]'$  Follow-up with PCP Additional Notes: Care advise provided, verbalizes understanding. Pt cannot get to appt until Wednesday. Appt secured.

## 2022-03-08 NOTE — Telephone Encounter (Signed)
Patient called, left VM to return the call to the office to speak to a nurse. Unable to reach patient after 3 attempts by [facility] NT, routing to the provider for resolution per protocol.   Summary: diabetic pt. feet cold and swollen   Pt's daughter called in for assistance. (My-chart message sent) daughter says that pt is a diabetic and her feet are swollen and cold. Pt doesn't have swelling anywhere else on her body.

## 2022-03-08 NOTE — Telephone Encounter (Signed)
Attempted to return call to schedule an appt. Per MyChart note from Dr. Alba Cory.  (Daughter sent a MyChart note and Dr. Alba Cory responded to it to make an appt.)

## 2022-03-08 NOTE — Telephone Encounter (Signed)
2nd attempt to reach pt, left VM to call back to discuss symptoms.

## 2022-03-09 NOTE — Telephone Encounter (Signed)
FYI

## 2022-03-09 NOTE — Telephone Encounter (Signed)
Appt 03/10/22.

## 2022-03-10 ENCOUNTER — Encounter: Payer: Self-pay | Admitting: Family Medicine

## 2022-03-10 ENCOUNTER — Ambulatory Visit: Payer: 59 | Admitting: Family Medicine

## 2022-03-10 VITALS — BP 134/73 | HR 84 | Resp 16 | Ht 64.0 in | Wt 216.0 lb

## 2022-03-10 DIAGNOSIS — I739 Peripheral vascular disease, unspecified: Secondary | ICD-10-CM | POA: Diagnosis not present

## 2022-03-10 DIAGNOSIS — M79672 Pain in left foot: Secondary | ICD-10-CM | POA: Insufficient documentation

## 2022-03-10 DIAGNOSIS — M7989 Other specified soft tissue disorders: Secondary | ICD-10-CM

## 2022-03-10 NOTE — Assessment & Plan Note (Signed)
Associated with coolness and swelling Referral to vascular

## 2022-03-10 NOTE — Assessment & Plan Note (Signed)
X4 days, denies recent surgery, travel, injury Recommend DVT r/o [place]

## 2022-03-10 NOTE — Assessment & Plan Note (Signed)
Chronic, recent exacerbation to L foot L calf swollen, no pitting edema L foot swollen, no pitting edema Coolness noted to toes Denies injury On ASA 81 mg Continues to decline smoking cessation

## 2022-03-10 NOTE — Progress Notes (Signed)
I,Tiffany J Bragg,acting as a scribe for Gwyneth Sprout, FNP.,have documented all relevant documentation on the behalf of Gwyneth Sprout, FNP,as directed by  Gwyneth Sprout, FNP while in the presence of Gwyneth Sprout, FNP. Established patient visit  Patient: Regina Pierce   DOB: 08-14-1955   66 y.o. Female  MRN: [id number] Visit Date: 03/10/2022  Today's healthcare provider: Gwyneth Sprout, FNP  Re Introduced to nurse practitioner role and practice setting.  All questions answered.  Discussed provider/patient relationship and expectations.  Chief Complaint  Patient presents with   Leg Swelling    Patient complains of L foot swelling, feeling cold and hurting for over a week. Did not injure herself that she knows of.    Subjective    HPI HPI     Leg Swelling    Additional comments: Patient complains of L foot swelling, feeling cold and hurting for over a week. Did not injure herself that she knows of.       Last edited by Smitty Knudsen, CMA on 03/10/2022  8:45 AM.      Medications: Outpatient Medications Prior to Visit  Medication Sig   aspirin 81 MG tablet Take 81 mg by mouth every evening.    cholecalciferol (VITAMIN D3) 25 MCG (1000 UNIT) tablet Take 2,000 Units by mouth daily.   desvenlafaxine (PRISTIQ) 25 MG 24 hr tablet Medication titration: Take 1 month of 50 mg (2-25 mg tablets) daily, 1 month of 25 mg tablets daily, and then take 25 mg every third day until completed. Report symptoms to PCP during titration with goal of wean.   glipiZIDE (GLUCOTROL) 5 MG tablet Take 1 tablet (5 mg total) by mouth daily before breakfast. (Patient taking differently: Take 5 mg by mouth daily before breakfast. Patient taking half a tab daily.)   levothyroxine (SYNTHROID) 112 MCG tablet Take 1 tablet (112 mcg total) by mouth daily. Complete labs prior to additional refills   lisinopril-hydrochlorothiazide (ZESTORETIC) 10-12.5 MG tablet TAKE 1 TABLET BY MOUTH ONCE A DAY   metFORMIN  (GLUCOPHAGE-XR) 750 MG 24 hr tablet TAKE 1 TABLET BY MOUTH 2 TIMES DAILY WITH BREAKFAST AND LUNCH   naproxen sodium (ANAPROX) 220 MG tablet Take 440 mg by mouth daily as needed (pain).   simvastatin (ZOCOR) 40 MG tablet Take 1 tablet (40 mg total) by mouth at bedtime.   No facility-administered medications prior to visit.   Review of Systems    Objective    BP 134/73 (BP Location: Left Arm, Patient Position: Sitting, Cuff Size: Large)   Pulse 84   Resp 16   Ht '5\' 4"'$  (1.626 m)   Wt 216 lb (98 kg)   LMP 04/27/2007   SpO2 98%   BMI 37.08 kg/m   Physical Exam Vitals and nursing note reviewed.  Constitutional:      General: She is not in acute distress.    Appearance: Normal appearance. She is obese. She is not ill-appearing, toxic-appearing or diaphoretic.  HENT:     Head: Normocephalic and atraumatic.  Cardiovascular:     Rate and Rhythm: Normal rate and regular rhythm.     Pulses:          Dorsalis pedis pulses are 1+ on the left side.       Posterior tibial pulses are 2+ on the left side.     Heart sounds: Normal heart sounds. No murmur heard.    No friction rub. No gallop.  Pulmonary:  Effort: Pulmonary effort is normal. No respiratory distress.     Breath sounds: Normal breath sounds. No stridor. No wheezing, rhonchi or rales.  Chest:     Chest wall: No tenderness.  Musculoskeletal:        General: No swelling, tenderness, deformity or signs of injury. Normal range of motion.     Right lower leg: No edema.     Left lower leg: Edema present.  Feet:     Left foot:     Skin integrity: Erythema and dry skin present.     Toenail Condition: Left toenails are abnormally thick.  Skin:    General: Skin is warm and dry.     Capillary Refill: Capillary refill takes less than 2 seconds.     Coloration: Skin is not jaundiced or pale.     Findings: No bruising, erythema, lesion or rash.  Neurological:     General: No focal deficit present.     Mental Status: She is alert  and oriented to person, place, and time. Mental status is at baseline.     Cranial Nerves: No cranial nerve deficit.     Sensory: No sensory deficit.     Motor: No weakness.     Coordination: Coordination normal.  Psychiatric:        Mood and Affect: Mood normal.        Behavior: Behavior normal.        Thought Content: Thought content normal.        Judgment: Judgment normal.     No results found for any visits on 03/10/22.  Assessment & Plan     Problem List Items Addressed This Visit       Cardiovascular and Mediastinum   PAD (peripheral artery disease) ([place]) - Primary    Chronic, recent exacerbation to L foot L calf swollen, no pitting edema L foot swollen, no pitting edema Coolness noted to toes Denies injury On ASA 81 mg Continues to decline smoking cessation        Other   Acute pain of left foot    Associated with coolness and swelling Referral to vascular       Relevant Orders   Ambulatory referral to Vascular Surgery   US Venous Img Lower Unilateral Left   Calf swelling    X4 days, denies recent surgery, travel, injury Recommend DVT r/o [place]      Relevant Orders   Ambulatory referral to Vascular Surgery   US Venous Img Lower Unilateral Left   Return if symptoms worsen or fail to improve- seek emergent care if foot becomes dusky, purple, cold etc.     I, Gwyneth Sprout, FNP, have reviewed all documentation for this visit. The documentation on 03/10/22 for the exam, diagnosis, procedures, and orders are all accurate and complete.  Gwyneth Sprout, [place] [phone] (phone) [phone] (fax)  [place]

## 2022-03-11 ENCOUNTER — Encounter: Payer: Self-pay | Admitting: *Deleted

## 2022-03-11 ENCOUNTER — Ambulatory Visit
Admission: RE | Admit: 2022-03-11 | Discharge: 2022-03-11 | Disposition: A | Discharge status: Home / Self Care | Payer: 59 | Source: Ambulatory Visit | Attending: Family Medicine | Admitting: Family Medicine

## 2022-03-11 DIAGNOSIS — M7989 Other specified soft tissue disorders: Secondary | ICD-10-CM | POA: Insufficient documentation

## 2022-03-11 DIAGNOSIS — M79662 Pain in left lower leg: Secondary | ICD-10-CM | POA: Diagnosis not present

## 2022-03-11 DIAGNOSIS — M79672 Pain in left foot: Secondary | ICD-10-CM | POA: Insufficient documentation

## 2022-03-11 NOTE — Progress Notes (Signed)
Negative DVT results; continue to follow up with vascular as discussed.

## 2022-03-12 ENCOUNTER — Other Ambulatory Visit (INDEPENDENT_AMBULATORY_CARE_PROVIDER_SITE_OTHER): Payer: Self-pay | Admitting: Nurse Practitioner

## 2022-03-12 ENCOUNTER — Other Ambulatory Visit (INDEPENDENT_AMBULATORY_CARE_PROVIDER_SITE_OTHER): Payer: 59

## 2022-03-12 ENCOUNTER — Ambulatory Visit (INDEPENDENT_AMBULATORY_CARE_PROVIDER_SITE_OTHER): Payer: 59 | Admitting: Nurse Practitioner

## 2022-03-12 VITALS — BP 149/73 | HR 84 | Resp 19 | Ht 64.5 in | Wt 215.0 lb

## 2022-03-12 DIAGNOSIS — M79672 Pain in left foot: Secondary | ICD-10-CM | POA: Diagnosis not present

## 2022-03-12 DIAGNOSIS — E1165 Type 2 diabetes mellitus with hyperglycemia: Secondary | ICD-10-CM | POA: Diagnosis not present

## 2022-03-12 DIAGNOSIS — Z720 Tobacco use: Secondary | ICD-10-CM

## 2022-03-12 DIAGNOSIS — M25572 Pain in left ankle and joints of left foot: Secondary | ICD-10-CM

## 2022-03-12 DIAGNOSIS — I739 Peripheral vascular disease, unspecified: Secondary | ICD-10-CM

## 2022-03-14 ENCOUNTER — Encounter (INDEPENDENT_AMBULATORY_CARE_PROVIDER_SITE_OTHER): Payer: Self-pay | Admitting: Nurse Practitioner

## 2022-03-14 NOTE — Progress Notes (Signed)
Subjective:    Patient ID: Regina Pierce, female    DOB: 22-Jun-1955, 66 y.o.   MRN: [id number] Chief Complaint  Patient presents with   Establish Care    Referred by dr Francis Dowse is a 66 year old female who presents today for evaluation of her primary care provider Tally Joe, NP for evaluation of left foot coldness and pain.  The patient notes that the pain and discomfort she had suddenly.  She notes that there is no pain or swelling in her calf.  She notes that her left foot suddenly felt cold and then she started having the swelling.  The patient notes that she had a recent DVT study which was negative.  The patient is a somewhat poor historian at the pain is described as a somewhat burning sensation.  She is not able to give a description as to the other sensation.  She notes that it is not worse with standing or walking.  She has also been trying elevation of her lower extremity.  There is a discoloration to her foot however this is not a new development she does that this has been present for years.  Today the patient had noninvasive studies which shows largely triphasic/biphasic waveforms throughout the left lower extremity.  No evidence of acute occlusion.  No Obvious stenosis is identified.    Review of Systems  Cardiovascular:  Positive for leg swelling.  All other systems reviewed and are negative.      Objective:   Physical Exam Vitals reviewed.  HENT:     Head: Normocephalic.  Cardiovascular:     Rate and Rhythm: Normal rate.     Pulses:          Dorsalis pedis pulses are 1+ on the left side.       Posterior tibial pulses are 1+ on the left side.  Pulmonary:     Effort: Pulmonary effort is normal.  Musculoskeletal:     Left lower leg: 1+ Pitting Edema present.  Skin:    General: Skin is warm and dry.  Neurological:     Mental Status: She is alert and oriented to person, place, and time.  Psychiatric:        Mood and Affect: Mood normal.         Behavior: Behavior normal.        Thought Content: Thought content normal.        Judgment: Judgment normal.     BP (!) 149/73 (BP Location: Right Arm)   Pulse 84   Resp 19   Ht 5' 4.5" (1.638 m)   Wt 215 lb (97.5 kg)   LMP 04/27/2007   BMI 36.33 kg/m   Past Medical History:  Diagnosis Date   Anxiety    Depression    Diabetes mellitus without complication ([place])    no meds   Hyperlipidemia    Hypertension    Thyroid disease     Social History   Socioeconomic History   Marital status: Married    Spouse name: Not on file   Number of children: Not on file   Years of education: Not on file   Highest education level: Not on file  Occupational History   Not on file  Tobacco Use   Smoking status: Every Day    Packs/day: 3.00    Years: 50.00    Total pack years: 150.00    Types: Cigarettes   Smokeless tobacco: Never  Vaping Use  Vaping Use: Never used  Substance and Sexual Activity   Alcohol use: No    Alcohol/week: 0.0 standard drinks of alcohol   Drug use: No   Sexual activity: Not Currently    Birth control/protection: Post-menopausal  Other Topics Concern   Not on file  Social History Narrative   Not on file   Social Determinants of Health   Financial Resource Strain: Not on file  Food Insecurity: Not on file  Transportation Needs: Not on file  Physical Activity: Not on file  Stress: Not on file  Social Connections: Not on file  Intimate Partner Violence: Not on file    Past Surgical History:  Procedure Laterality Date   ACHILLES TENDON SURGERY Left 05/12/2016   Procedure: ACHILLES TENDON REPAIR;  Surgeon: Samara Deist, DPM;  Location: [facility] ORS;  Service: Podiatry;  Laterality: Left;   BONE EXOSTOSIS EXCISION Left 05/12/2016   Procedure: CALCANEAL EXOSTECTOMY AND DORSAL EXOSTECTOMY;  Surgeon: Samara Deist, DPM;  Location: [facility] ORS;  Service: Podiatry;  Laterality: Left;   COLONOSCOPY WITH PROPOFOL N/A 07/20/2016   Procedure: COLONOSCOPY WITH  PROPOFOL;  Surgeon: Jonathon Bellows, MD;  Location: [facility] ENDOSCOPY;  Service: Endoscopy;  Laterality: N/A;   COLONOSCOPY WITH PROPOFOL N/A 09/09/2021   Procedure: COLONOSCOPY WITH PROPOFOL;  Surgeon: Jonathon Bellows, MD;  Location: [facility] ENDOSCOPY;  Service: Gastroenterology;  Laterality: N/A;   HAND SURGERY Bilateral 1999   carpal tunnel    KNEE SURGERY  2010   TUBAL LIGATION  1987    Family History  Problem Relation Age of Onset   Hypertension Sister    Breast cancer Neg Hx    Colon cancer Neg Hx     No Known Allergies     Latest Ref Rng & Units 02/19/2022    1:09 PM 07/11/2020   11:17 AM 07/05/2019   12:06 PM  CBC  WBC 3.4 - 10.8 x10E3/uL 8.8  9.1  8.5   Hemoglobin 11.1 - 15.9 g/dL 15.6  16.5  15.4   Hematocrit 34.0 - 46.6 % 46.1  48.3  44.3   Platelets 150 - 450 x10E3/uL 193  162  133       CMP     Component Value Date/Time   NA 141 02/19/2022 1309   K 4.5 02/19/2022 1309   CL 100 02/19/2022 1309   CO2 26 02/19/2022 1309   GLUCOSE 91 02/19/2022 1309   GLUCOSE 134 (H) 05/10/2016 1127   BUN 16 02/19/2022 1309   CREATININE 0.91 02/19/2022 1309   CREATININE 0.98 09/10/2011 0907   CALCIUM 9.7 02/19/2022 1309   PROT 6.7 02/19/2022 1309   ALBUMIN 4.2 02/19/2022 1309   AST 17 02/19/2022 1309   ALT 19 02/19/2022 1309   ALKPHOS 80 02/19/2022 1309   BILITOT 0.6 02/19/2022 1309   GFRNONAA 90 03/28/2020 1404   GFRNONAA >60 09/10/2011 0907   GFRAA 104 03/28/2020 1404   GFRAA >60 09/10/2011 0907     No results found.     Assessment & Plan:   1. PAD (peripheral artery disease) ([place]) Noninvasive studies today do not show any evidence of occlusive vascular disease which description of pain symptoms patient is describing.  There is also no evidence of emergent occlusion with would require intervention.  Based on the studies the patient has mild PAD currently that does not necessitate intervention at this time.  Patient will follow-up with [place] on an as-needed basis.  2. Foot pain,  left Based upon the patient's description of symptoms I suspect  that her pain may be multifactorial in cause.  I suspect that there is a neuropathic component and also possible arthritis/gout.  I have discussed use of compression socks for the swelling that is largely contained to the foot itself.  Patient will follow-up with her PCP for further work-up and evaluation.  3. Tobacco abuse Smoking cessation was discussed, 3-10 minutes spent on this topic specifically  4. Type 2 diabetes mellitus with hyperglycemia, without long-term current use of insulin (HCC) Continue hypoglycemic medications as already ordered, these medications have been reviewed and there are no changes at this time.  Hgb A1C to be monitored as already arranged by primary service   Current Outpatient Medications on File Prior to Visit  Medication Sig Dispense Refill   aspirin 81 MG tablet Take 81 mg by mouth every evening.      cholecalciferol (VITAMIN D3) 25 MCG (1000 UNIT) tablet Take 2,000 Units by mouth daily.     desvenlafaxine (PRISTIQ) 25 MG 24 hr tablet Medication titration: Take 1 month of 50 mg (2-25 mg tablets) daily, 1 month of 25 mg tablets daily, and then take 25 mg every third day until completed. Report symptoms to PCP during titration with goal of wean. 100 tablet 1   glipiZIDE (GLUCOTROL) 5 MG tablet Take 1 tablet (5 mg total) by mouth daily before breakfast. (Patient taking differently: Take 5 mg by mouth daily before breakfast. Patient taking half a tab daily.) 30 tablet 2   levothyroxine (SYNTHROID) 112 MCG tablet Take 1 tablet (112 mcg total) by mouth daily. Complete labs prior to additional refills 90 tablet 0   lisinopril-hydrochlorothiazide (ZESTORETIC) 10-12.5 MG tablet TAKE 1 TABLET BY MOUTH ONCE A DAY 90 tablet 1   metFORMIN (GLUCOPHAGE-XR) 750 MG 24 hr tablet TAKE 1 TABLET BY MOUTH 2 TIMES DAILY WITH BREAKFAST AND LUNCH 180 tablet 1   simvastatin (ZOCOR) 40 MG tablet Take 1 tablet (40 mg total) by  mouth at bedtime. 90 tablet 3   naproxen sodium (ANAPROX) 220 MG tablet Take 440 mg by mouth daily as needed (pain). (Patient not taking: Reported on 03/12/2022)     No current facility-administered medications on file prior to visit.    There are no Patient Instructions on file for this visit. No follow-ups on file.   Kris Hartmann, NP

## 2022-03-16 ENCOUNTER — Other Ambulatory Visit: Payer: Self-pay | Admitting: Family Medicine

## 2022-03-16 DIAGNOSIS — M79672 Pain in left foot: Secondary | ICD-10-CM

## 2022-03-25 ENCOUNTER — Telehealth: Payer: Self-pay | Admitting: Family Medicine

## 2022-03-25 NOTE — Telephone Encounter (Signed)
LMOVM for pt's daughter to return call. X-ray's were ordered and can be done at outpatient imaging on [address]. No appointment necessary. Okay for pec to advise. Thanks.

## 2022-03-26 ENCOUNTER — Ambulatory Visit
Admission: RE | Admit: 2022-03-26 | Discharge: 2022-03-26 | Disposition: A | Discharge status: Home / Self Care | Payer: 59 | Source: Ambulatory Visit | Attending: Family Medicine | Admitting: Family Medicine

## 2022-03-26 ENCOUNTER — Ambulatory Visit
Admission: RE | Admit: 2022-03-26 | Discharge: 2022-03-26 | Disposition: A | Discharge status: Home / Self Care | Payer: 59 | Attending: Family Medicine | Admitting: Family Medicine

## 2022-03-26 DIAGNOSIS — M79672 Pain in left foot: Secondary | ICD-10-CM | POA: Insufficient documentation

## 2022-03-26 DIAGNOSIS — M7732 Calcaneal spur, left foot: Secondary | ICD-10-CM | POA: Diagnosis not present

## 2022-03-26 DIAGNOSIS — M7989 Other specified soft tissue disorders: Secondary | ICD-10-CM | POA: Diagnosis not present

## 2022-03-26 NOTE — Telephone Encounter (Signed)
Left message on daughter's cell.

## 2022-04-05 ENCOUNTER — Telehealth: Payer: Self-pay | Admitting: Family Medicine

## 2022-04-05 ENCOUNTER — Other Ambulatory Visit: Payer: Self-pay

## 2022-04-05 DIAGNOSIS — I152 Hypertension secondary to endocrine disorders: Secondary | ICD-10-CM

## 2022-04-05 MED ORDER — LISINOPRIL-HYDROCHLOROTHIAZIDE 10-12.5 MG PO TABS: 1.0000 | ORAL_TABLET | Freq: Every day | ORAL | 1 refills | Status: DC

## 2022-04-05 NOTE — Telephone Encounter (Signed)
[facility] faxed refill request for the following medications:   lisinopril-hydrochlorothiazide (ZESTORETIC) 20-25 MG tablet    Please advise.

## 2022-04-08 ENCOUNTER — Other Ambulatory Visit: Payer: Self-pay | Admitting: Family Medicine

## 2022-04-21 ENCOUNTER — Other Ambulatory Visit: Payer: Self-pay

## 2022-04-21 DIAGNOSIS — F17210 Nicotine dependence, cigarettes, uncomplicated: Secondary | ICD-10-CM

## 2022-04-21 DIAGNOSIS — Z122 Encounter for screening for malignant neoplasm of respiratory organs: Secondary | ICD-10-CM

## 2022-04-21 DIAGNOSIS — Z87891 Personal history of nicotine dependence: Secondary | ICD-10-CM

## 2022-04-22 DIAGNOSIS — R202 Paresthesia of skin: Secondary | ICD-10-CM | POA: Diagnosis not present

## 2022-04-22 DIAGNOSIS — M79672 Pain in left foot: Secondary | ICD-10-CM | POA: Diagnosis not present

## 2022-04-22 DIAGNOSIS — R200 Anesthesia of skin: Secondary | ICD-10-CM | POA: Diagnosis not present

## 2022-04-22 DIAGNOSIS — M79671 Pain in right foot: Secondary | ICD-10-CM | POA: Diagnosis not present

## 2022-05-06 ENCOUNTER — Other Ambulatory Visit: Payer: Self-pay | Admitting: Family Medicine

## 2022-05-06 DIAGNOSIS — E1140 Type 2 diabetes mellitus with diabetic neuropathy, unspecified: Secondary | ICD-10-CM

## 2022-05-17 NOTE — Progress Notes (Signed)
I,Connie R Striblin,acting as a Education administrator for Gwyneth Sprout, FNP.,have documented all relevant documentation on the behalf of Gwyneth Sprout, FNP,as directed by  Gwyneth Sprout, FNP while in the presence of Gwyneth Sprout, FNP.  Complete physical exam  Patient: Regina Pierce   DOB: 02-Feb-1956   67 y.o. Female  MRN: [id number] Visit Date: 05/18/2022  Today's healthcare provider: Gwyneth Sprout, FNP  Re Introduced to nurse practitioner role and practice setting.  All questions answered.  Discussed provider/patient relationship and expectations.  Chief Complaint  Patient presents with   Annual Exam   Subjective    Regina Pierce is a 67 y.o. female who presents today for a complete physical exam.  She reports consuming a general diet. The patient does not participate in regular exercise at present. She generally feels well. She reports sleeping fairly well. She does not have additional problems to discuss today.  HPI  Patient decline covid vaccine.   Past Medical History:  Diagnosis Date   Anxiety    Depression    Diabetes mellitus without complication ([place])    no meds   Hyperlipidemia    Hypertension    Thyroid disease    Past Surgical History:  Procedure Laterality Date   ACHILLES TENDON SURGERY Left 05/12/2016   Procedure: ACHILLES TENDON REPAIR;  Surgeon: Samara Deist, DPM;  Location: [facility] ORS;  Service: Podiatry;  Laterality: Left;   BONE EXOSTOSIS EXCISION Left 05/12/2016   Procedure: CALCANEAL EXOSTECTOMY AND DORSAL EXOSTECTOMY;  Surgeon: Samara Deist, DPM;  Location: [facility] ORS;  Service: Podiatry;  Laterality: Left;   COLONOSCOPY WITH PROPOFOL N/A 07/20/2016   Procedure: COLONOSCOPY WITH PROPOFOL;  Surgeon: Jonathon Bellows, MD;  Location: [facility] ENDOSCOPY;  Service: Endoscopy;  Laterality: N/A;   COLONOSCOPY WITH PROPOFOL N/A 09/09/2021   Procedure: COLONOSCOPY WITH PROPOFOL;  Surgeon: Jonathon Bellows, MD;  Location: [facility] ENDOSCOPY;  Service: Gastroenterology;  Laterality: N/A;   HAND  SURGERY Bilateral 1999   carpal tunnel    KNEE SURGERY  2010   TUBAL LIGATION  1987   Social History   Socioeconomic History   Marital status: Married    Spouse name: Not on file   Number of children: Not on file   Years of education: Not on file   Highest education level: Not on file  Occupational History   Not on file  Tobacco Use   Smoking status: Every Day    Packs/day: 3.00    Years: 50.00    Total pack years: 150.00    Types: Cigarettes   Smokeless tobacco: Never  Vaping Use   Vaping Use: Never used  Substance and Sexual Activity   Alcohol use: No    Alcohol/week: 0.0 standard drinks of alcohol   Drug use: No   Sexual activity: Not Currently    Birth control/protection: Post-menopausal  Other Topics Concern   Not on file  Social History Narrative   Not on file   Social Determinants of Health   Financial Resource Strain: Not on file  Food Insecurity: Not on file  Transportation Needs: Not on file  Physical Activity: Not on file  Stress: Not on file  Social Connections: Not on file  Intimate Partner Violence: Not on file   Family Status  Relation Name Status   Mother  Deceased   Father  Deceased   Sister  Deceased at age 24       heart defect   Brother  Alive   Sister  Deceased       [employer] accident   Sister  Alive   Neg Hx  (Not Specified)   Family History  Problem Relation Age of Onset   Hypertension Sister    Breast cancer Neg Hx    Colon cancer Neg Hx    No Known Allergies  Patient Care Team: Gwyneth Sprout, FNP as PCP - General (Family Medicine)   Medications: Outpatient Medications Prior to Visit  Medication Sig   aspirin 81 MG tablet Take 81 mg by mouth every evening.    cholecalciferol (VITAMIN D3) 25 MCG (1000 UNIT) tablet Take 2,000 Units by mouth daily.   desvenlafaxine (PRISTIQ) 25 MG 24 hr tablet Medication titration: Take 1 month of 50 mg (2-25 mg tablets) daily, 1 month of 25 mg tablets daily, and then take 25 mg every third  day until completed. Report symptoms to PCP during titration with goal of wean.   gabapentin (NEURONTIN) 100 MG capsule Take 100 mg by mouth as needed.   levothyroxine (SYNTHROID) 112 MCG tablet Take 1 tablet (112 mcg total) by mouth daily. Complete labs prior to additional refills   lisinopril-hydrochlorothiazide (ZESTORETIC) 10-12.5 MG tablet Take 1 tablet by mouth daily.   metFORMIN (GLUCOPHAGE-XR) 750 MG 24 hr tablet TAKE 1 TABLET BY MOUTH 2 TIMES DAILY WITH BREAKFAST AND LUNCH   naproxen sodium (ANAPROX) 220 MG tablet Take 440 mg by mouth daily as needed (pain).   simvastatin (ZOCOR) 40 MG tablet Take 1 tablet (40 mg total) by mouth at bedtime.   [DISCONTINUED] glipiZIDE (GLUCOTROL) 5 MG tablet Take 1 tablet (5 mg total) by mouth daily before breakfast. (Patient not taking: Reported on 05/18/2022)   No facility-administered medications prior to visit.    Review of Systems   Objective    BP 139/61 (BP Location: Right Arm, Patient Position: Sitting, Cuff Size: Large)   Pulse 78   Temp 99.1 F (37.3 C) (Oral)   Ht 5' 4.5" (1.638 m)   Wt 208 lb (94.3 kg)   LMP 04/27/2007   SpO2 100%   BMI 35.15 kg/m   Physical Exam Vitals and nursing note reviewed.  Constitutional:      General: She is awake. She is not in acute distress.    Appearance: Normal appearance. She is well-developed and well-groomed. She is obese. She is not ill-appearing, toxic-appearing or diaphoretic.  HENT:     Head: Normocephalic and atraumatic.     Jaw: There is normal jaw occlusion. No trismus, tenderness, swelling or pain on movement.     Right Ear: Tympanic membrane, ear canal and external ear normal. Decreased hearing noted. There is no impacted cerumen.     Left Ear: Tympanic membrane, ear canal and external ear normal. Decreased hearing noted. There is no impacted cerumen.     Nose: Nose normal. No congestion or rhinorrhea.     Right Turbinates: Not enlarged, swollen or pale.     Left Turbinates: Not  enlarged, swollen or pale.     Right Sinus: No maxillary sinus tenderness or frontal sinus tenderness.     Left Sinus: No maxillary sinus tenderness or frontal sinus tenderness.     Mouth/Throat:     Lips: Pink.     Mouth: Mucous membranes are moist. No injury.     Tongue: No lesions.     Pharynx: Oropharynx is clear. Uvula midline. No pharyngeal swelling, oropharyngeal exudate, posterior oropharyngeal erythema or uvula swelling.     Tonsils: No tonsillar exudate or tonsillar  abscesses.  Eyes:     General: Lids are normal. Lids are everted, no foreign bodies appreciated. Vision grossly intact. Gaze aligned appropriately. No allergic shiner or visual field deficit.       Right eye: No discharge.        Left eye: No discharge.     Extraocular Movements: Extraocular movements intact.     Conjunctiva/sclera: Conjunctivae normal.     Right eye: Right conjunctiva is not injected. No exudate.    Left eye: Left conjunctiva is not injected. No exudate.    Pupils: Pupils are equal, round, and reactive to light.  Neck:     Thyroid: No thyroid mass, thyromegaly or thyroid tenderness.     Vascular: No carotid bruit.     Trachea: Trachea normal.  Cardiovascular:     Rate and Rhythm: Normal rate and regular rhythm.     Pulses: Normal pulses.          Carotid pulses are 2+ on the right side and 2+ on the left side.      Radial pulses are 2+ on the right side and 2+ on the left side.       Dorsalis pedis pulses are 2+ on the right side and 2+ on the left side.       Posterior tibial pulses are 2+ on the right side and 2+ on the left side.     Heart sounds: Normal heart sounds, S1 normal and S2 normal. No murmur heard.    No friction rub. No gallop.  Pulmonary:     Effort: Pulmonary effort is normal. No respiratory distress.     Breath sounds: Normal breath sounds and air entry. No stridor. No wheezing, rhonchi or rales.  Chest:     Chest wall: No tenderness.  Abdominal:     General: Abdomen is  flat. Bowel sounds are normal. There is no distension.     Palpations: Abdomen is soft. There is no mass.     Tenderness: There is no abdominal tenderness. There is no right CVA tenderness, left CVA tenderness, guarding or rebound.     Hernia: No hernia is present.  Genitourinary:    Comments: Exam deferred; denies complaints Musculoskeletal:        General: No swelling, tenderness, deformity or signs of injury. Normal range of motion.     Cervical back: Full passive range of motion without pain, normal range of motion and neck supple. No edema, rigidity or tenderness. No muscular tenderness.     Right lower leg: No edema.     Left lower leg: No edema.  Lymphadenopathy:     Cervical: No cervical adenopathy.     Right cervical: No superficial, deep or posterior cervical adenopathy.    Left cervical: No superficial, deep or posterior cervical adenopathy.  Skin:    General: Skin is warm and dry.     Capillary Refill: Capillary refill takes less than 2 seconds.     Coloration: Skin is not jaundiced or pale.     Findings: No bruising, erythema, lesion or rash.  Neurological:     General: No focal deficit present.     Mental Status: She is alert and oriented to person, place, and time. Mental status is at baseline.     GCS: GCS eye subscore is 4. GCS verbal subscore is 5. GCS motor subscore is 6.     Sensory: Sensation is intact. No sensory deficit.     Motor: Motor function is intact. No  weakness.     Coordination: Coordination is intact. Coordination normal.     Gait: Gait is intact. Gait normal.  Psychiatric:        Attention and Perception: Attention and perception normal.        Mood and Affect: Mood and affect normal.        Speech: Speech normal.        Behavior: Behavior normal. Behavior is cooperative.        Thought Content: Thought content normal.        Cognition and Memory: Cognition and memory normal.        Judgment: Judgment normal.     Last depression screening  scores    03/10/2022    8:42 AM 02/19/2022    1:23 PM 09/22/2021   10:53 AM  PHQ 2/9 Scores  PHQ - 2 Score 0 0 1  PHQ- 9 Score '5 2 8   '$ Last fall risk screening    03/10/2022    8:42 AM  Fall Risk   Falls in the past year? 0  Number falls in past yr: 0  Injury with Fall? 0  Risk for fall due to : No Fall Risks  Follow up Falls evaluation completed   Last Audit-C alcohol use screening    03/10/2022    8:42 AM  Alcohol Use Disorder Test (AUDIT)  1. How often do you have a drink containing alcohol? 0  2. How many drinks containing alcohol do you have on a typical day when you are drinking? 0  3. How often do you have six or more drinks on one occasion? 0  AUDIT-C Score 0   A score of 3 or more in women, and 4 or more in men indicates increased risk for alcohol abuse, EXCEPT if all of the points are from question 1   No results found for any visits on 05/18/22.  Assessment & Plan    Routine Health Maintenance and Physical Exam  Exercise Activities and Dietary recommendations  Goals      Exercise 150 minutes per week (moderate activity)        Immunization History  Administered Date(s) Administered   Influenza Split 01/17/2009, 01/22/2010, 03/31/2011, 01/14/2012   Influenza,inj,Quad PF,6+ Mos 04/13/2013, 05/13/2015   PFIZER(Purple Top)SARS-COV-2 Vaccination 12/13/2019, 01/03/2020   Td 07/03/2018   Tdap 12/07/2007    Health Maintenance  Topic Date Due   Lung Cancer Screening  Never done   COVID-19 Vaccine (3 - 2023-24 season) 12/25/2021   Zoster Vaccines- Shingrix (1 of 2) 05/22/2022 (Originally 08/07/2007)   INFLUENZA VACCINE  07/25/2022 (Originally 11/24/2021)   HEMOGLOBIN A1C  08/21/2022   Diabetic kidney evaluation - Urine ACR  09/03/2022   MAMMOGRAM  09/04/2022   COLONOSCOPY (Pts 45-77yr Insurance coverage will need to be confirmed)  09/10/2022   Diabetic kidney evaluation - eGFR measurement  02/20/2023   FOOT EXAM  02/20/2023   OPHTHALMOLOGY EXAM   02/20/2023   PAP SMEAR-Modifier  09/08/2024   DTaP/Tdap/Td (3 - Td or Tdap) 07/02/2028   Hepatitis C Screening  Completed   HIV Screening  Completed   HPV VACCINES  Aged Out    Discussed health benefits of physical activity, and encouraged her to engage in regular exercise appropriate for her age and condition.  Problem List Items Addressed This Visit       Other   Annual physical exam - Primary    Declines use of covid booster Completes all screening for health maintenance No acute  concerns today Doing well mood wise Continues to decline smoking cessation Things to do to keep yourself healthy  - Exercise at least 30-45 minutes a day, 3-4 days a week.  - Eat a low-fat diet with lots of fruits and vegetables, up to 7-9 servings per day.  - Seatbelts can save your life. Wear them always.  - Smoke detectors on every level of your home, check batteries every year.  - Eye Doctor - have an eye exam every 1-2 years  - Safe sex - if you may be exposed to STDs, use a condom.  - Alcohol -  If you drink, do it moderately, less than 2 drinks per day.  - [place]. Choose someone to speak for you if you are not able.  - Depression is common in our stressful world.If you're feeling down or losing interest in things you normally enjoy, please come in for a visit.  - Violence - If anyone is threatening or hurting you, please call immediately.       Return in about 3 months (around 08/17/2022) for chonic disease management.    Vonna Kotyk, FNP, have reviewed all documentation for this visit. The documentation on 05/18/22 for the exam, diagnosis, procedures, and orders are all accurate and complete.  Gwyneth Sprout, [place] [phone] (phone) [phone] (fax)  [place]

## 2022-05-18 ENCOUNTER — Ambulatory Visit (INDEPENDENT_AMBULATORY_CARE_PROVIDER_SITE_OTHER): Payer: 59 | Admitting: Family Medicine

## 2022-05-18 ENCOUNTER — Encounter: Payer: Self-pay | Admitting: Family Medicine

## 2022-05-18 VITALS — BP 139/61 | HR 78 | Temp 99.1°F | Ht 64.5 in | Wt 208.0 lb

## 2022-05-18 DIAGNOSIS — Z0000 Encounter for general adult medical examination without abnormal findings: Secondary | ICD-10-CM

## 2022-05-18 NOTE — Assessment & Plan Note (Signed)
Declines use of covid booster Completes all screening for health maintenance No acute concerns today Doing well mood wise Continues to decline smoking cessation Things to do to keep yourself healthy  - Exercise at least 30-45 minutes a day, 3-4 days a week.  - Eat a low-fat diet with lots of fruits and vegetables, up to 7-9 servings per day.  - Seatbelts can save your life. Wear them always.  - Smoke detectors on every level of your home, check batteries every year.  - Eye Doctor - have an eye exam every 1-2 years  - Safe sex - if you may be exposed to STDs, use a condom.  - Alcohol -  If you drink, do it moderately, less than 2 drinks per day.  - [place]. Choose someone to speak for you if you are not able.  - Depression is common in our stressful world.If you're feeling down or losing interest in things you normally enjoy, please come in for a visit.  - Violence - If anyone is threatening or hurting you, please call immediately.

## 2022-05-20 ENCOUNTER — Encounter: Payer: 59 | Admitting: Family Medicine

## 2022-05-31 DIAGNOSIS — M79672 Pain in left foot: Secondary | ICD-10-CM | POA: Diagnosis not present

## 2022-05-31 DIAGNOSIS — M79671 Pain in right foot: Secondary | ICD-10-CM | POA: Diagnosis not present

## 2022-06-14 DIAGNOSIS — R413 Other amnesia: Secondary | ICD-10-CM | POA: Diagnosis not present

## 2022-06-14 DIAGNOSIS — G479 Sleep disorder, unspecified: Secondary | ICD-10-CM | POA: Diagnosis not present

## 2022-06-14 DIAGNOSIS — M79672 Pain in left foot: Secondary | ICD-10-CM | POA: Diagnosis not present

## 2022-06-14 DIAGNOSIS — M79671 Pain in right foot: Secondary | ICD-10-CM | POA: Diagnosis not present

## 2022-06-14 DIAGNOSIS — R4189 Other symptoms and signs involving cognitive functions and awareness: Secondary | ICD-10-CM | POA: Diagnosis not present

## 2022-06-28 ENCOUNTER — Other Ambulatory Visit: Payer: Self-pay | Admitting: Family Medicine

## 2022-06-29 ENCOUNTER — Other Ambulatory Visit: Payer: Self-pay | Admitting: Family Medicine

## 2022-06-29 NOTE — Telephone Encounter (Signed)
Requested Prescriptions  Refused Prescriptions Disp Refills   levothyroxine (SYNTHROID) 112 MCG tablet 90 tablet 0    Sig: Take 1 tablet (112 mcg total) by mouth daily. Complete labs prior to additional refills     Endocrinology:  Hypothyroid Agents Failed - 06/29/2022  4:03 PM      Failed - TSH in normal range and within 360 days    TSH  Date Value Ref Range Status  02/19/2022 8.910 (H) 0.450 - 4.500 uIU/mL Final         Passed - Valid encounter within last 12 months    Recent Outpatient Visits           1 month ago Annual physical exam   [facility] Tally Joe T, FNP   3 months ago PAD (peripheral artery disease) [facility])   [place] Tally Joe T, FNP   4 months ago Type 2 diabetes mellitus with hyperglycemia, without long-term current use of insulin [facility])   [place] Tally Joe T, FNP   8 months ago Type 2 diabetes mellitus with hyperglycemia, without long-term current use of insulin [facility])   [place] Gwyneth Sprout, FNP   9 months ago [place] Gwyneth Sprout, FNP       Future Appointments             In 1 month Rollene Rotunda, Jaci Standard, [place], [facility]

## 2022-06-29 NOTE — Telephone Encounter (Signed)
Copied from [place] [id number]. Topic: General - Other >> Jun 29, 2022  3:55 PM Everette C wrote: Reason for CRM: Medication Refill - Medication: levothyroxine (SYNTHROID) 112 MCG tablet [id number]  Has the patient contacted their pharmacy? Yes.   (Agent: If no, request that the patient contact the pharmacy for the refill. If patient does not wish to contact the pharmacy document the reason why and proceed with request.) (Agent: If yes, when and what did the pharmacy advise?)  Preferred Pharmacy (with phone number or street name): [facility] - [address], [place] - [address] [place] [address] Phone: [phone] Fax: [phone] Hours: Not open 24 hours  *** Has the patient been seen for an appointment in the last year OR does the patient have an upcoming appointment? Yes.    Agent: Please be advised that RX refills may take up to 3 business days. We ask that you follow-up with your pharmacy.

## 2022-06-30 ENCOUNTER — Encounter: Payer: Self-pay | Admitting: Family Medicine

## 2022-06-30 NOTE — Telephone Encounter (Signed)
Please advise 

## 2022-07-01 ENCOUNTER — Other Ambulatory Visit: Payer: Self-pay | Admitting: Family Medicine

## 2022-07-01 DIAGNOSIS — E1159 Type 2 diabetes mellitus with other circulatory complications: Secondary | ICD-10-CM

## 2022-07-01 DIAGNOSIS — E1165 Type 2 diabetes mellitus with hyperglycemia: Secondary | ICD-10-CM

## 2022-07-01 MED ORDER — GABAPENTIN 100 MG PO CAPS: 100.0000 mg | ORAL_CAPSULE | Freq: Every day | ORAL | 3 refills | Status: DC

## 2022-07-01 MED ORDER — LISINOPRIL-HYDROCHLOROTHIAZIDE 10-12.5 MG PO TABS: 1.0000 | ORAL_TABLET | Freq: Every day | ORAL | 3 refills | Status: DC

## 2022-07-01 MED ORDER — SIMVASTATIN 40 MG PO TABS: 40.0000 mg | ORAL_TABLET | Freq: Every day | ORAL | 3 refills | Status: DC

## 2022-07-01 MED ORDER — METFORMIN HCL ER 750 MG PO TB24: 750.0000 mg | ORAL_TABLET | Freq: Two times a day (BID) | ORAL | 3 refills | Status: DC

## 2022-07-01 MED ORDER — LEVOTHYROXINE SODIUM 112 MCG PO TABS: 112.0000 ug | ORAL_TABLET | Freq: Every day | ORAL | 3 refills | Status: DC

## 2022-08-19 ENCOUNTER — Encounter: Payer: Self-pay | Admitting: Family Medicine

## 2022-08-19 ENCOUNTER — Ambulatory Visit (INDEPENDENT_AMBULATORY_CARE_PROVIDER_SITE_OTHER): Payer: Medicare HMO | Admitting: Family Medicine

## 2022-08-19 VITALS — BP 133/70 | HR 78 | Temp 98.8°F | Ht 64.0 in | Wt 204.8 lb

## 2022-08-19 DIAGNOSIS — E785 Hyperlipidemia, unspecified: Secondary | ICD-10-CM

## 2022-08-19 DIAGNOSIS — E1169 Type 2 diabetes mellitus with other specified complication: Secondary | ICD-10-CM | POA: Diagnosis not present

## 2022-08-19 DIAGNOSIS — I739 Peripheral vascular disease, unspecified: Secondary | ICD-10-CM

## 2022-08-19 DIAGNOSIS — F339 Major depressive disorder, recurrent, unspecified: Secondary | ICD-10-CM | POA: Diagnosis not present

## 2022-08-19 DIAGNOSIS — E1140 Type 2 diabetes mellitus with diabetic neuropathy, unspecified: Secondary | ICD-10-CM | POA: Diagnosis not present

## 2022-08-19 DIAGNOSIS — E559 Vitamin D deficiency, unspecified: Secondary | ICD-10-CM | POA: Diagnosis not present

## 2022-08-19 DIAGNOSIS — I152 Hypertension secondary to endocrine disorders: Secondary | ICD-10-CM

## 2022-08-19 DIAGNOSIS — E1159 Type 2 diabetes mellitus with other circulatory complications: Secondary | ICD-10-CM

## 2022-08-19 DIAGNOSIS — E039 Hypothyroidism, unspecified: Secondary | ICD-10-CM | POA: Diagnosis not present

## 2022-08-19 DIAGNOSIS — E1165 Type 2 diabetes mellitus with hyperglycemia: Secondary | ICD-10-CM | POA: Diagnosis not present

## 2022-08-19 NOTE — Assessment & Plan Note (Signed)
Chronic, previously elevated Remains on zocor at 40 mg Repeat LP LDL goal of <70

## 2022-08-19 NOTE — Assessment & Plan Note (Signed)
Chronic, remains on 2000 IU daily Repeat labs

## 2022-08-19 NOTE — Assessment & Plan Note (Signed)
Chronic, slightly elevated Discussed goal of <129/<79 Remains on zestoretic 10-12.5

## 2022-08-19 NOTE — Progress Notes (Signed)
I,J'ya E Hunter,acting as a scribe for Jacky Kindle, FNP.,have documented all relevant documentation on the behalf of Jacky Kindle, FNP,as directed by  Jacky Kindle, FNP while in the presence of Jacky Kindle, FNP.  Established patient visit  Patient: Regina Pierce   DOB: 1956-04-06   67 y.o. Female  MRN: [id number] Visit Date: 08/19/2022  Today's healthcare provider: Jacky Kindle, FNP  Re Introduced to nurse practitioner role and practice setting.  All questions answered.  Discussed provider/patient relationship and expectations.  Presents with daughter Judeth [place]field  Chief Complaint  Patient presents with   Chronic Disease Follow-Up   Subjective    HPI  Diabetes Mellitus Type II, Follow-up  Lab Results  Component Value Date   HGBA1C 5.9 (H) 02/19/2022   HGBA1C 6.0 (A) 10/28/2021   HGBA1C 9.4 (H) 07/29/2021   Wt Readings from Last 3 Encounters:  08/19/22 204 lb 12.8 oz (92.9 kg)  05/18/22 208 lb (94.3 kg)  03/12/22 215 lb (97.5 kg)   Last seen for diabetes 3 months ago.  Management since then includes none. She reports good compliance with treatment. She is not having side effects.  Symptoms: No fatigue No foot ulcerations  No appetite changes No nausea  No paresthesia of the feet  No polydipsia  No polyuria No visual disturbances   No vomiting     Home blood sugar records:  is not being recorded   Episodes of hypoglycemia? No   Most Recent Eye Exam: July 2023 Current exercise: none Current diet habits: not asked  Pertinent Labs: Lab Results  Component Value Date   CHOL 152 10/28/2021   HDL 37 (L) 10/28/2021   LDLCALC 86 10/28/2021   TRIG 167 (H) 10/28/2021   CHOLHDL 4.1 10/28/2021   Lab Results  Component Value Date   NA 141 02/19/2022   K 4.5 02/19/2022   CREATININE 0.91 02/19/2022   EGFR 70 02/19/2022   LABMICR 61.6 09/02/2021   MICRALBCREAT 38 (H) 09/02/2021      --------------------------------------------------------------------------------------------------- Follow up for PAD (peripheral artery disease) (HCC)  The patient was last seen for this 3 months ago. Changes made at last visit include none.  She reports fair compliance with treatment. She feels that condition is Unchanged. She is not having side effects.   -----------------------------------------------------------------------------------------   Medications: Outpatient Medications Prior to Visit  Medication Sig   aspirin 81 MG tablet Take 81 mg by mouth every evening.    cholecalciferol (VITAMIN D3) 25 MCG (1000 UNIT) tablet Take 2,000 Units by mouth daily.   gabapentin (NEURONTIN) 100 MG capsule Take 1 capsule (100 mg total) by mouth at bedtime.   levothyroxine (SYNTHROID) 112 MCG tablet Take 1 tablet (112 mcg total) by mouth daily before breakfast.   lisinopril-hydrochlorothiazide (ZESTORETIC) 10-12.5 MG tablet Take 1 tablet by mouth daily.   metFORMIN (GLUCOPHAGE-XR) 750 MG 24 hr tablet Take 1 tablet (750 mg total) by mouth 2 (two) times daily after a meal.   naproxen sodium (ANAPROX) 220 MG tablet Take 440 mg by mouth daily as needed (pain).   simvastatin (ZOCOR) 40 MG tablet Take 1 tablet (40 mg total) by mouth at bedtime.   No facility-administered medications prior to visit.    Review of Systems    Objective    BP 133/70 (BP Location: Left Arm, Patient Position: Sitting, Cuff Size: Large)   Pulse 78   Temp 98.8 F (37.1 C) (Oral)   Ht 5\' 4"  (1.626 m)  Wt 204 lb 12.8 oz (92.9 kg)   LMP 04/27/2007   SpO2 95%   BMI 35.15 kg/m   Physical Exam Vitals and nursing note reviewed.  Constitutional:      General: She is not in acute distress.    Appearance: Normal appearance. She is obese. She is not ill-appearing, toxic-appearing or diaphoretic.  HENT:     Head: Normocephalic and atraumatic.  Cardiovascular:     Rate and Rhythm: Normal rate and regular rhythm.      Pulses: Normal pulses.     Heart sounds: Normal heart sounds. No murmur heard.    No friction rub. No gallop.  Pulmonary:     Effort: Pulmonary effort is normal. No respiratory distress.     Breath sounds: Normal breath sounds. No stridor. No wheezing, rhonchi or rales.  Chest:     Chest wall: No tenderness.  Musculoskeletal:        General: No swelling, tenderness, deformity or signs of injury. Normal range of motion.     Right lower leg: No edema.     Left lower leg: No edema.  Skin:    General: Skin is warm and dry.     Capillary Refill: Capillary refill takes less than 2 seconds.     Coloration: Skin is not jaundiced or pale.     Findings: No bruising, erythema, lesion or rash.  Neurological:     General: No focal deficit present.     Mental Status: She is alert and oriented to person, place, and time. Mental status is at baseline.     Cranial Nerves: No cranial nerve deficit.     Sensory: No sensory deficit.     Motor: No weakness.     Coordination: Coordination normal.  Psychiatric:        Mood and Affect: Mood normal.        Behavior: Behavior normal.        Thought Content: Thought content normal.        Judgment: Judgment normal.     No results found for any visits on 08/19/22.  Assessment & Plan     Problem List Items Addressed This Visit       Cardiovascular and Mediastinum   Hypertension associated with diabetes - Primary    Chronic, slightly elevated Discussed goal of <129/<79 Remains on zestoretic 10-12.5      Relevant Orders   Comprehensive Metabolic Panel (CMET)   CBC with Differential/Platelet   Hemoglobin A1c   Urine Microalbumin w/creat. ratio   PAD (peripheral artery disease)    Chronic, variable Feet/ankles remain pink, cool Without concern for worsening edema  Continue to recommend smoking cessation in addition to exercise to assist       Relevant Orders   Lipid panel     Endocrine   Hyperlipidemia associated with type 2 diabetes  mellitus    Chronic, previously elevated Remains on zocor at 40 mg Repeat LP LDL goal of <70       Hypothyroidism    Chronic, previously overstimulated Currently on 112 mcg synthroid Repeat labs Denies concerns beyond excessive sleep [ongoing concern]      Relevant Orders   TSH + free T4   Type 2 diabetes mellitus with diabetic neuropathy, without long-term current use of insulin    Chronic, well controlled On low dose asa 81 mg as well as 100 mg gaba qHS as well as metformin 750 mg BID and zocor 40 mg  Repeat A1c and urine micro  Relevant Orders   Comprehensive Metabolic Panel (CMET)   CBC with Differential/Platelet   Hemoglobin A1c   Urine Microalbumin w/creat. ratio     Other   Vitamin D deficiency    Chronic, remains on 2000 IU daily Repeat labs      Relevant Orders   Vitamin D (25 hydroxy)   Return in about 3 months (around 11/18/2022) for chonic disease management.     Leilani Merl, FNP, have reviewed all documentation for this visit. The documentation on 08/19/22 for the exam, diagnosis, procedures, and orders are all accurate and complete.  Jacky Kindle, FNP  [facility] [phone] (phone) [phone] (fax)  [place] [facility]

## 2022-08-19 NOTE — Assessment & Plan Note (Signed)
Chronic, previously overstimulated Currently on 112 mcg synthroid Repeat labs Denies concerns beyond excessive sleep [ongoing concern]

## 2022-08-19 NOTE — Assessment & Plan Note (Signed)
Chronic, well controlled On low dose asa 81 mg as well as 100 mg gaba qHS as well as metformin 750 mg BID and zocor 40 mg  Repeat A1c and urine micro

## 2022-08-19 NOTE — Assessment & Plan Note (Signed)
Chronic, variable Feet/ankles remain pink, cool Without concern for worsening edema  Continue to recommend smoking cessation in addition to exercise to assist

## 2022-08-20 ENCOUNTER — Encounter: Payer: Self-pay | Admitting: Family Medicine

## 2022-08-20 LAB — CBC WITH DIFFERENTIAL/PLATELET
Basophils Absolute: 0.1 10*3/uL (ref 0.0–0.2)
Basos: 1 %
EOS (ABSOLUTE): 0.1 10*3/uL (ref 0.0–0.4)
Eos: 2 %
Hematocrit: 45.6 % (ref 34.0–46.6)
Hemoglobin: 15.5 g/dL (ref 11.1–15.9)
Immature Grans (Abs): 0 10*3/uL (ref 0.0–0.1)
Immature Granulocytes: 0 %
Lymphocytes Absolute: 2.4 10*3/uL (ref 0.7–3.1)
Lymphs: 33 %
MCH: 31.2 pg (ref 26.6–33.0)
MCHC: 34 g/dL (ref 31.5–35.7)
MCV: 92 fL (ref 79–97)
Monocytes Absolute: 0.5 10*3/uL (ref 0.1–0.9)
Monocytes: 6 %
Neutrophils Absolute: 4.1 10*3/uL (ref 1.4–7.0)
Neutrophils: 58 %
Platelets: 167 10*3/uL (ref 150–450)
RBC: 4.97 x10E6/uL (ref 3.77–5.28)
RDW: 12.5 % (ref 11.7–15.4)
WBC: 7.1 10*3/uL (ref 3.4–10.8)

## 2022-08-20 LAB — LIPID PANEL
Chol/HDL Ratio: 3.3 ratio (ref 0.0–4.4)
Cholesterol, Total: 137 mg/dL (ref 100–199)
HDL: 41 mg/dL (ref >39)
LDL Chol Calc (NIH): 67 mg/dL (ref 0–99)
Triglycerides: 171 mg/dL — ABNORMAL HIGH (ref 0–149)
VLDL Cholesterol Cal: 29 mg/dL (ref 5–40)

## 2022-08-20 LAB — COMPREHENSIVE METABOLIC PANEL
ALT: 16 IU/L (ref 0–32)
AST: 20 IU/L (ref 0–40)
Albumin/Globulin Ratio: 2.1 (ref 1.2–2.2)
Albumin: 4.4 g/dL (ref 3.9–4.9)
Alkaline Phosphatase: 74 IU/L (ref 44–121)
BUN/Creatinine Ratio: 16 (ref 12–28)
BUN: 13 mg/dL (ref 8–27)
Bilirubin Total: 0.9 mg/dL (ref 0.0–1.2)
CO2: 25 mmol/L (ref 20–29)
Calcium: 9.6 mg/dL (ref 8.7–10.3)
Chloride: 101 mmol/L (ref 96–106)
Creatinine, Ser: 0.81 mg/dL (ref 0.57–1.00)
Globulin, Total: 2.1 g/dL (ref 1.5–4.5)
Glucose: 99 mg/dL (ref 70–99)
Potassium: 4 mmol/L (ref 3.5–5.2)
Sodium: 140 mmol/L (ref 134–144)
Total Protein: 6.5 g/dL (ref 6.0–8.5)
eGFR: 81 mL/min/{1.73_m2} (ref >59)

## 2022-08-20 LAB — HEMOGLOBIN A1C
Est. average glucose Bld gHb Est-mCnc: 134 mg/dL
Hgb A1c MFr Bld: 6.3 % — ABNORMAL HIGH (ref 4.8–5.6)

## 2022-08-20 LAB — TSH+FREE T4
Free T4: 1.68 ng/dL (ref 0.82–1.77)
TSH: 10.1 u[IU]/mL — ABNORMAL HIGH (ref 0.450–4.500)

## 2022-08-20 LAB — MICROALBUMIN / CREATININE URINE RATIO
Creatinine, Urine: 125.7 mg/dL
Microalb/Creat Ratio: 168 mg/g creat — ABNORMAL HIGH (ref 0–29)
Microalbumin, Urine: 211.7 ug/mL

## 2022-08-20 LAB — VITAMIN D 25 HYDROXY (VIT D DEFICIENCY, FRACTURES): Vit D, 25-Hydroxy: 38 ng/mL (ref 30.0–100.0)

## 2022-08-20 NOTE — Progress Notes (Signed)
Urine micro is elevated; recommend 6 month f/u to reassess with diabetes management.  Jacky Kindle, FNP  [facility] [address] [place], [place] [address] [phone] (phone) [phone] (fax) [facility]

## 2022-08-20 NOTE — Progress Notes (Signed)
TSH remains elevated in subclinical range. Recommend medication titration if desired and ensure patient is taking dose, 30 mins before food and drink, with water alone without other medications.  Cholesterol continues to show stable elevation in fats. Calculated risk of heart attack/stroke remains 25% in 10 years. The 10-year ASCVD risk score (Arnett DK, et al., 2019) is: 24.4%   Values used to calculate the score:     Age: 67 years     Sex: Female     Is Non-Hispanic African American: No     Diabetic: Yes     Tobacco smoker: Yes     Systolic Blood Pressure: 133 mmHg     Is BP treated: Yes     HDL Cholesterol: 41 mg/dL     Total Cholesterol: 137 mg/dL  Diabetes marker, Z6X, has elevated by 0.4% however, remains well controlled with A1c <7%.   Urine is pending; other labs are normal and stable. Vit D has continued to improve.

## 2022-08-23 ENCOUNTER — Other Ambulatory Visit: Payer: Self-pay | Admitting: Family Medicine

## 2022-08-24 ENCOUNTER — Other Ambulatory Visit: Payer: Self-pay | Admitting: Family Medicine

## 2022-08-24 DIAGNOSIS — Z841 Family history of disorders of kidney and ureter: Secondary | ICD-10-CM

## 2022-08-24 MED ORDER — LEVOTHYROXINE SODIUM 125 MCG PO TABS: 125.0000 ug | ORAL_TABLET | Freq: Every day | ORAL | 3 refills | Status: DC

## 2022-08-31 ENCOUNTER — Ambulatory Visit: Payer: Medicare HMO | Admitting: Acute Care

## 2022-08-31 ENCOUNTER — Encounter: Payer: Self-pay | Admitting: Acute Care

## 2022-08-31 DIAGNOSIS — F17210 Nicotine dependence, cigarettes, uncomplicated: Secondary | ICD-10-CM

## 2022-08-31 NOTE — Progress Notes (Signed)
Virtual Visit via Telephone Note  I connected with Regina Pierce on 08/31/22 at  1:30 PM EDT by telephone and verified that I am speaking with the correct person using two identifiers.  Location: Patient:  At home  Provider:  [address], [place], [place], [address]    I discussed the limitations, risks, security and privacy concerns of performing an evaluation and management service by telephone and the availability of in person appointments. I also discussed with the patient that there may be a patient responsible charge related to this service. The patient expressed understanding and agreed to proceed.   Shared Decision Making Visit Lung Cancer Screening Program [phone])   Eligibility: Age 67 y.o. Pack Years Smoking History Calculation 96 pack years (# packs/per year x # years smoked) Recent History of coughing up blood  no Unexplained weight loss? no ( >Than 15 pounds within the last 6 months ) Prior History Lung / other cancer no (Diagnosis within the last 5 years already requiring surveillance chest CT Scans). Smoking Status Current Smoker Former Smokers: Years since quit:  NA  Quit Date:  NA  Visit Components: Discussion included one or more decision making aids. yes Discussion included risk/benefits of screening. yes Discussion included potential follow up diagnostic testing for abnormal scans. yes Discussion included meaning and risk of over diagnosis. yes Discussion included meaning and risk of False Positives. yes Discussion included meaning of total radiation exposure. yes  Counseling Included: Importance of adherence to annual lung cancer LDCT screening. yes Impact of comorbidities on ability to participate in the program. yes Ability and willingness to under diagnostic treatment. yes  Smoking Cessation Counseling: Current Smokers:  Discussed importance of smoking cessation. yes Information about tobacco cessation classes and interventions provided to  patient. yes Patient provided with "ticket" for LDCT Scan. yes Symptomatic Patient. no  Counseling NA Diagnosis Code: Tobacco Use Z72.0 Asymptomatic Patient yes  Counseling (Intermediate counseling: > three minutes counseling) U0454 Former Smokers:  Discussed the importance of maintaining cigarette abstinence. yes Diagnosis Code: Personal History of Nicotine Dependence. U98.119 Information about tobacco cessation classes and interventions provided to patient. Yes Patient provided with "ticket" for LDCT Scan. yes Written Order for Lung Cancer Screening with LDCT placed in Epic. Yes (CT Chest Lung Cancer Screening Low Dose W/O CM) [id number] Z12.2-Screening of respiratory organs Z87.891-Personal history of nicotine dependence  I have spent 25 minutes of face to face/ virtual visit   time with  Ms. Balla discussing the risks and benefits of lung cancer screening. We viewed / discussed a power point together that explained in detail the above noted topics. We paused at intervals to allow for questions to be asked and answered to ensure understanding.We discussed that the single most powerful action that she can take to decrease her risk of developing lung cancer is to quit smoking. We discussed whether or not she is ready to commit to setting a quit date. We discussed options for tools to aid in quitting smoking including nicotine replacement therapy, non-nicotine medications, support groups, Quit Smart classes, and behavior modification. We discussed that often times setting smaller, more achievable goals, such as eliminating 1 cigarette a day for a week and then 2 cigarettes a day for a week can be helpful in slowly decreasing the number of cigarettes smoked. This allows for a sense of accomplishment as well as providing a clinical benefit. I provided  her  with smoking cessation  information  with contact information for community resources, classes,  free nicotine replacement therapy, and access to  mobile apps, text messaging, and on-line smoking cessation help. I have also provided  her  the office contact information in the event she needs to contact me, or the screening staff. We discussed the time and location of the scan, and that either Abigail Miyamoto RN, Karlton Lemon, RN  or I will call / send a letter with the results within 24-72 hours of receiving them. The patient verbalized understanding of all of  the above and had no further questions upon leaving the office. They have my contact information in the event they have any further questions.  I spent 3-4 minutes counseling on smoking cessation and the health risks of continued tobacco abuse.  I explained to the patient that there has been a high incidence of coronary artery disease noted on these exams. I explained that this is a non-gated exam therefore degree or severity cannot be determined. This patient is on statin therapy. I have asked the patient to follow-up with their PCP regarding any incidental finding of coronary artery disease and management with diet or medication as their PCP  feels is clinically indicated. The patient verbalized understanding of the above and had no further questions upon completion of the visit.      Bevelyn Ngo, NP 08/31/2022

## 2022-08-31 NOTE — Patient Instructions (Signed)
Thank you for participating in the [place] [facility] Cancer Screening Program. It was our pleasure to meet you today. We will call you with the results of your scan within the next few days. Your scan will be assigned a Lung RADS category score by the physicians reading the scans.  This Lung RADS score determines follow up scanning.  See below for description of categories, and follow up screening recommendations. We will be in touch to schedule your follow up screening annually or based on recommendations of our providers. We will fax a copy of your scan results to your Primary Care Physician, or the physician who referred you to the program, to ensure they have the results. Please call the office if you have any questions or concerns regarding your scanning experience or results.  Our office number is [phone]. Please speak with Denise Phelps, RN. , or  Denise Buckner RN, They are  our Lung Cancer Screening RN.'s If They are unavailable when you call, Please leave a message on the voice mail. We will return your call at our earliest convenience.This voice mail is monitored several times a day.  Remember, if your scan is normal, we will scan you annually as long as you continue to meet the criteria for the program. (Age 50-80, Current smoker or smoker who has quit within the last 15 years). If you are a smoker, remember, quitting is the single most powerful action that you can take to decrease your risk of lung cancer and other pulmonary, breathing related problems. We know quitting is hard, and we are here to help.  Please let us know if there is anything we can do to help you meet your goal of quitting. If you are a former smoker, congratulations. We are proud of you! Remain smoke free! Remember you can refer friends or family members through the number above.  We will screen them to make sure they meet criteria for the program. Thank you for helping us take better care of you by  participating in Lung Screening.  You can receive free nicotine replacement therapy ( patches, gum or mints) by calling 1-800-QUIT NOW. Please call so we can get you on the path to becoming  a non-smoker. I know it is hard, but you can do this!  Lung RADS Categories:  Lung RADS 1: no nodules or definitely non-concerning nodules.  Recommendation is for a repeat annual scan in 12 months.  Lung RADS 2:  nodules that are non-concerning in appearance and behavior with a very low likelihood of becoming an active cancer. Recommendation is for a repeat annual scan in 12 months.  Lung RADS 3: nodules that are probably non-concerning , includes nodules with a low likelihood of becoming an active cancer.  Recommendation is for a 6-month repeat screening scan. Often noted after an upper respiratory illness. We will be in touch to make sure you have no questions, and to schedule your 6-month scan.  Lung RADS 4 A: nodules with concerning findings, recommendation is most often for a follow up scan in 3 months or additional testing based on our provider's assessment of the scan. We will be in touch to make sure you have no questions and to schedule the recommended 3 month follow up scan.  Lung RADS 4 B:  indicates findings that are concerning. We will be in touch with you to schedule additional diagnostic testing based on our provider's  assessment of the scan.  Other options for assistance in smoking cessation (   As covered by your insurance benefits)  Hypnosis for smoking cessation  [employer] [phone]  Acupuncture for smoking cessation  [facility] [phone]   

## 2022-09-06 ENCOUNTER — Ambulatory Visit
Admission: RE | Admit: 2022-09-06 | Discharge: 2022-09-06 | Disposition: A | Discharge status: Home / Self Care | Payer: Medicare HMO | Source: Ambulatory Visit | Attending: Acute Care | Admitting: Acute Care

## 2022-09-06 DIAGNOSIS — F17210 Nicotine dependence, cigarettes, uncomplicated: Secondary | ICD-10-CM

## 2022-09-06 DIAGNOSIS — Z122 Encounter for screening for malignant neoplasm of respiratory organs: Secondary | ICD-10-CM

## 2022-09-06 DIAGNOSIS — Z87891 Personal history of nicotine dependence: Secondary | ICD-10-CM | POA: Diagnosis present

## 2022-09-07 ENCOUNTER — Other Ambulatory Visit: Payer: Self-pay | Admitting: Family Medicine

## 2022-09-07 ENCOUNTER — Telehealth: Payer: Self-pay

## 2022-09-07 DIAGNOSIS — Z1231 Encounter for screening mammogram for malignant neoplasm of breast: Secondary | ICD-10-CM

## 2022-09-07 NOTE — Telephone Encounter (Signed)
Returned patients call.  Daughter answered the call.  She said her mother was not available.  I asked her daughter to let her know that her call was returned from [facility].  Thanks, [place], [place]

## 2022-09-07 NOTE — Telephone Encounter (Signed)
Pt left vmm to schedule colonoscopy please return call

## 2022-09-08 ENCOUNTER — Telehealth: Payer: Self-pay

## 2022-09-08 ENCOUNTER — Other Ambulatory Visit: Payer: Self-pay

## 2022-09-08 DIAGNOSIS — Z86010 Personal history of colonic polyps: Secondary | ICD-10-CM

## 2022-09-08 MED ORDER — NA SULFATE-K SULFATE-MG SULF 17.5-3.13-1.6 GM/177ML PO SOLN: 1.0000 | Freq: Once | ORAL | 0 refills | Status: AC

## 2022-09-08 NOTE — Telephone Encounter (Signed)
Pt daughter stephanie called to schedule her moms colonoscopy please return call

## 2022-09-08 NOTE — Telephone Encounter (Signed)
Gastroenterology Pre-Procedure Review  Request Date: 10/11/22 Requesting Physician: Dr. Tobi Bastos  PATIENT REVIEW QUESTIONS: The patient responded to the following health history questions as indicated:    1. Are you having any GI issues? no 2. Do you have a personal history of Polyps? yes (last colonoscopy performed by Dr. Tobi Bastos 09/09/21 multiple polyps were noted) 3. Do you have a family history of Colon Cancer or Polyps? no 4. Diabetes Mellitus? yes (patient's daughter has been informed her mother will need to stop Meformin 2 days prior to colonoscopy) 5. Joint replacements in the past 12 months?no 6. Major health problems in the past 3 months?no 7. Any artificial heart valves, MVP, or defibrillator?no    MEDICATIONS & ALLERGIES:    Patient reports the following regarding taking any anticoagulation/antiplatelet therapy:   Plavix, Coumadin, Eliquis, Xarelto, Lovenox, Pradaxa, Brilinta, or Effient? no Aspirin? 81 mg daily  Patient confirms/reports the following medications:  Current Outpatient Medications  Medication Sig Dispense Refill   aspirin 81 MG tablet Take 81 mg by mouth every evening.      cholecalciferol (VITAMIN D3) 25 MCG (1000 UNIT) tablet Take 2,000 Units by mouth daily.     gabapentin (NEURONTIN) 100 MG capsule Take 1 capsule (100 mg total) by mouth at bedtime. 90 capsule 3   levothyroxine (SYNTHROID) 125 MCG tablet Take 1 tablet (125 mcg total) by mouth daily. 90 tablet 3   lisinopril-hydrochlorothiazide (ZESTORETIC) 10-12.5 MG tablet Take 1 tablet by mouth daily. 90 tablet 3   metFORMIN (GLUCOPHAGE-XR) 750 MG 24 hr tablet Take 1 tablet (750 mg total) by mouth 2 (two) times daily after a meal. 180 tablet 3   naproxen sodium (ANAPROX) 220 MG tablet Take 440 mg by mouth daily as needed (pain).     simvastatin (ZOCOR) 40 MG tablet Take 1 tablet (40 mg total) by mouth at bedtime. 90 tablet 3   No current facility-administered medications for this visit.    Patient  confirms/reports the following allergies:  No Known Allergies  No orders of the defined types were placed in this encounter.   AUTHORIZATION INFORMATION Primary Insurance: 1D#: Group #:  Secondary Insurance: 1D#: Group #:  SCHEDULE INFORMATION: Date: 10/11/22 Time: Location: [facility]

## 2022-09-08 NOTE — Addendum Note (Signed)
Addended by: Avie Arenas on: 09/08/2022 01:48 PM   Modules accepted: Orders

## 2022-09-09 NOTE — Progress Notes (Signed)
Continue CT yearly. 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

## 2022-09-09 NOTE — Progress Notes (Deleted)
ANNUAL PREVENTATIVE CARE GYNECOLOGY  ENCOUNTER NOTE  Subjective:       Regina Pierce is a 67 y.o. G69P2002 female here for a routine annual gynecologic exam. The patient is not sexually active. The patient is not currently taking hormone replacement therapy. Patient denies post-menopausal vaginal bleeding. The patient wears seatbelts: yes. The patient participates in regular exercise: yes. Has the patient ever been transfused or tattooed?: no. The patient reports that there is not domestic violence in her life.    Current complaints: 1.  ***    Gynecologic History Patient's last menstrual period was 04/27/2007. Contraception: post menopausal status Last Pap: 09/08/2021. Results were: abnormal (Positive for HR HPV) Last mammogram: 09/03/2021. Results were: normal Last Colonoscopy: 09/09/2021: Repeat in 1 year Last Dexa Scan: Due   Obstetric History OB History  Gravida Para Term Preterm AB Living  2 2 2     2   SAB IAB Ectopic Multiple Live Births          2    # Outcome Date GA Lbr Len/2nd Weight Sex Delivery Anes PTL Lv  2 Term      Vag-Spont   LIV  1 Term      Vag-Spont   LIV    Past Medical History:  Diagnosis Date   Anxiety    Depression    Diabetes mellitus without complication (HCC)    no meds   Hyperlipidemia    Hypertension    Thyroid disease     Family History  Problem Relation Age of Onset   Hypertension Sister    Breast cancer Neg Hx    Colon cancer Neg Hx     Past Surgical History:  Procedure Laterality Date   ACHILLES TENDON SURGERY Left 05/12/2016   Procedure: ACHILLES TENDON REPAIR;  Surgeon: Gwyneth Revels, DPM;  Location: [facility] ORS;  Service: Podiatry;  Laterality: Left;   BONE EXOSTOSIS EXCISION Left 05/12/2016   Procedure: CALCANEAL EXOSTECTOMY AND DORSAL EXOSTECTOMY;  Surgeon: Gwyneth Revels, DPM;  Location: [facility] ORS;  Service: Podiatry;  Laterality: Left;   COLONOSCOPY WITH PROPOFOL N/A 07/20/2016   Procedure: COLONOSCOPY WITH PROPOFOL;   Surgeon: Wyline Mood, MD;  Location: [facility] ENDOSCOPY;  Service: Endoscopy;  Laterality: N/A;   COLONOSCOPY WITH PROPOFOL N/A 09/09/2021   Procedure: COLONOSCOPY WITH PROPOFOL;  Surgeon: Wyline Mood, MD;  Location: [facility] ENDOSCOPY;  Service: Gastroenterology;  Laterality: N/A;   HAND SURGERY Bilateral 1999   carpal tunnel    KNEE SURGERY  2010   TUBAL LIGATION  1987    Social History   Socioeconomic History   Marital status: Married    Spouse name: Not on file   Number of children: Not on file   Years of education: Not on file   Highest education level: Not on file  Occupational History   Not on file  Tobacco Use   Smoking status: Every Day    Packs/day: 3.00    Years: 50.00    Additional pack years: 0.00    Total pack years: 150.00    Types: Cigarettes   Smokeless tobacco: Never  Vaping Use   Vaping Use: Never used  Substance and Sexual Activity   Alcohol use: No    Alcohol/week: 0.0 standard drinks of alcohol   Drug use: No   Sexual activity: Not Currently    Birth control/protection: Post-menopausal  Other Topics Concern   Not on file  Social History Narrative   Not on file   Social Determinants of Health  Financial Resource Strain: Not on file  Food Insecurity: Not on file  Transportation Needs: Not on file  Physical Activity: Not on file  Stress: Not on file  Social Connections: Not on file  Intimate Partner Violence: Not on file    Current Outpatient Medications on File Prior to Visit  Medication Sig Dispense Refill   aspirin 81 MG tablet Take 81 mg by mouth every evening.      cholecalciferol (VITAMIN D3) 25 MCG (1000 UNIT) tablet Take 2,000 Units by mouth daily.     gabapentin (NEURONTIN) 100 MG capsule Take 1 capsule (100 mg total) by mouth at bedtime. 90 capsule 3   levothyroxine (SYNTHROID) 125 MCG tablet Take 1 tablet (125 mcg total) by mouth daily. 90 tablet 3   lisinopril-hydrochlorothiazide (ZESTORETIC) 10-12.5 MG tablet Take 1 tablet by mouth  daily. 90 tablet 3   metFORMIN (GLUCOPHAGE-XR) 750 MG 24 hr tablet Take 1 tablet (750 mg total) by mouth 2 (two) times daily after a meal. 180 tablet 3   naproxen sodium (ANAPROX) 220 MG tablet Take 440 mg by mouth daily as needed (pain).     simvastatin (ZOCOR) 40 MG tablet Take 1 tablet (40 mg total) by mouth at bedtime. 90 tablet 3   No current facility-administered medications on file prior to visit.    No Known Allergies    Review of Systems ROS Review of Systems - General ROS: negative for - chills, fatigue, fever, hot flashes, night sweats, weight gain or weight loss Psychological ROS: negative for - anxiety, decreased libido, depression, mood swings, physical abuse or sexual abuse Ophthalmic ROS: negative for - blurry vision, eye pain or loss of vision ENT ROS: negative for - headaches, hearing change, visual changes or vocal changes Allergy and Immunology ROS: negative for - hives, itchy/watery eyes or seasonal allergies Hematological and Lymphatic ROS: negative for - bleeding problems, bruising, swollen lymph nodes or weight loss Endocrine ROS: negative for - galactorrhea, hair pattern changes, hot flashes, malaise/lethargy, mood swings, palpitations, polydipsia/polyuria, skin changes, temperature intolerance or unexpected weight changes Breast ROS: negative for - new or changing breast lumps or nipple discharge Respiratory ROS: negative for - cough or shortness of breath Cardiovascular ROS: negative for - chest pain, irregular heartbeat, palpitations or shortness of breath Gastrointestinal ROS: no abdominal pain, change in bowel habits, or black or bloody stools Genito-Urinary ROS: no dysuria, trouble voiding, or hematuria Musculoskeletal ROS: negative for - joint pain or joint stiffness Neurological ROS: negative for - bowel and bladder control changes Dermatological ROS: negative for rash and skin lesion changes   Objective:   LMP 04/27/2007  CONSTITUTIONAL:  Well-developed, well-nourished female in no acute distress.  PSYCHIATRIC: Normal mood and affect. Normal behavior. Normal judgment and thought content. NEUROLGIC: Alert and oriented to person, place, and time. Normal muscle tone coordination. No cranial nerve deficit noted. HENT:  Normocephalic, atraumatic, External right and left ear normal. Oropharynx is clear and moist EYES: Conjunctivae and EOM are normal. Pupils are equal, round, and reactive to light. No scleral icterus.  NECK: Normal range of motion, supple, no masses.  Normal thyroid.  SKIN: Skin is warm and dry. No rash noted. Not diaphoretic. No erythema. No pallor. CARDIOVASCULAR: Normal heart rate noted, regular rhythm, no murmur. RESPIRATORY: Clear to auscultation bilaterally. Effort and breath sounds normal, no problems with respiration noted. BREASTS: Symmetric in size. No masses, skin changes, nipple drainage, or lymphadenopathy. ABDOMEN: Soft, normal bowel sounds, no distention noted.  No tenderness, rebound or guarding.  BLADDER: Normal PELVIC:  Bladder {:311640}  Urethra: {:311719}  Vulva: {:311722}  Vagina: {:311643}  Cervix: {:311644}  Uterus: {:311718}  Adnexa: {:311645}  RV: {Blank multiple:19196::"External Exam NormaI","No Rectal Masses","Normal Sphincter tone"}  MUSCULOSKELETAL: Normal range of motion. No tenderness.  No cyanosis, clubbing, or edema.  2+ distal pulses. LYMPHATIC: No Axillary, Supraclavicular, or Inguinal Adenopathy.   Labs: Lab Results  Component Value Date   WBC 7.1 08/19/2022   HGB 15.5 08/19/2022   HCT 45.6 08/19/2022   MCV 92 08/19/2022   PLT 167 08/19/2022    Lab Results  Component Value Date   CREATININE 0.81 08/19/2022   BUN 13 08/19/2022   NA 140 08/19/2022   K 4.0 08/19/2022   CL 101 08/19/2022   CO2 25 08/19/2022    Lab Results  Component Value Date   ALT 16 08/19/2022   AST 20 08/19/2022   ALKPHOS 74 08/19/2022   BILITOT 0.9 08/19/2022    Lab Results  Component  Value Date   CHOL 137 08/19/2022   HDL 41 08/19/2022   LDLCALC 67 08/19/2022   TRIG 171 (H) 08/19/2022   CHOLHDL 3.3 08/19/2022    Lab Results  Component Value Date   TSH 10.100 (H) 08/19/2022    Lab Results  Component Value Date   HGBA1C 6.3 (H) 08/19/2022     Assessment:   1. Encounter for well woman exam with routine gynecological exam   2. Cervical cancer screening   3. Colon cancer screening   4. Postmenopausal      Plan:  Pap:  Ordered Mammogram: Ordered Colon Screening:  Ordered Labs:  UTD by PCP Routine preventative health maintenance measures emphasized:  Self Breast Exams and Exercise/Diet/Weight control COVID Vaccination status: Return to Clinic - 1 Year   Hildred Laser, MD [facility]

## 2022-09-10 ENCOUNTER — Encounter: Payer: Medicare HMO | Admitting: Obstetrics and Gynecology

## 2022-09-10 ENCOUNTER — Telehealth: Payer: Self-pay | Admitting: Obstetrics and Gynecology

## 2022-09-10 DIAGNOSIS — Z01419 Encounter for gynecological examination (general) (routine) without abnormal findings: Secondary | ICD-10-CM

## 2022-09-10 DIAGNOSIS — Z124 Encounter for screening for malignant neoplasm of cervix: Secondary | ICD-10-CM

## 2022-09-10 DIAGNOSIS — Z1211 Encounter for screening for malignant neoplasm of colon: Secondary | ICD-10-CM

## 2022-09-10 DIAGNOSIS — Z780 Asymptomatic menopausal state: Secondary | ICD-10-CM

## 2022-09-10 NOTE — Telephone Encounter (Signed)
I contacted this patient via phone, about needing to rescheduled today's appointment Friday, 5/17 at 2:15 due to Dr. Valentino Saxon being called to hospital due to an emergency. I asked about rescheduling and the patient states "she is 67 years old and doesn't wish to rescheduled". Please advise?

## 2022-09-11 NOTE — Telephone Encounter (Signed)
Her pap smear is up to date.  If she desires to now do every other year visits that's fine, as long as she is still being seen by her PCP.  Would need to make sure she is up to date on he mammogram.

## 2022-09-15 ENCOUNTER — Other Ambulatory Visit: Payer: Self-pay | Admitting: Acute Care

## 2022-09-15 DIAGNOSIS — Z87891 Personal history of nicotine dependence: Secondary | ICD-10-CM

## 2022-09-15 DIAGNOSIS — Z122 Encounter for screening for malignant neoplasm of respiratory organs: Secondary | ICD-10-CM

## 2022-09-15 DIAGNOSIS — F17210 Nicotine dependence, cigarettes, uncomplicated: Secondary | ICD-10-CM

## 2022-09-15 NOTE — Telephone Encounter (Signed)
I contacted the patient to make sure she is aware she still needs to follow up for breast exams, also , mammograms. And the follow recommendation per  Dr. Valentino Saxon.

## 2022-09-24 ENCOUNTER — Telehealth: Payer: Self-pay | Admitting: Family Medicine

## 2022-09-24 NOTE — Telephone Encounter (Signed)
Contacted Regina Pierce to schedule their annual wellness visit. Welcome to Medicare visit Due by 07/26/2023.  Thank you,  [facility] Direct dial  [phone]

## 2022-10-04 ENCOUNTER — Encounter: Payer: Self-pay | Admitting: Gastroenterology

## 2022-10-06 ENCOUNTER — Other Ambulatory Visit: Payer: Self-pay | Admitting: Family Medicine

## 2022-10-06 ENCOUNTER — Ambulatory Visit
Admission: RE | Admit: 2022-10-06 | Discharge: 2022-10-06 | Disposition: A | Discharge status: Home / Self Care | Payer: Medicare HMO | Source: Ambulatory Visit | Attending: Family Medicine | Admitting: Family Medicine

## 2022-10-06 DIAGNOSIS — Z1231 Encounter for screening mammogram for malignant neoplasm of breast: Secondary | ICD-10-CM | POA: Insufficient documentation

## 2022-10-07 DIAGNOSIS — E119 Type 2 diabetes mellitus without complications: Secondary | ICD-10-CM | POA: Diagnosis not present

## 2022-10-07 DIAGNOSIS — H524 Presbyopia: Secondary | ICD-10-CM | POA: Diagnosis not present

## 2022-10-07 LAB — HM DIABETES EYE EXAM

## 2022-10-08 NOTE — Progress Notes (Signed)
Hi Regina Pierce  Normal mammogram; repeat in 1 year.  Please let us know if you have any questions.  Thank you,  Merita Norton, FNP

## 2022-10-11 ENCOUNTER — Ambulatory Visit
Admission: RE | Admit: 2022-10-11 | Discharge: 2022-10-11 | Disposition: A | Discharge status: Home / Self Care | Payer: Medicare HMO | Attending: Gastroenterology | Admitting: Gastroenterology

## 2022-10-11 ENCOUNTER — Encounter
Admission: RE | Disposition: A | Discharge status: Home / Self Care | Payer: Self-pay | Source: Home / Self Care | Attending: Gastroenterology

## 2022-10-11 ENCOUNTER — Ambulatory Visit: Payer: Medicare HMO | Admitting: Anesthesiology

## 2022-10-11 DIAGNOSIS — Z860100 Personal history of colon polyps, unspecified: Secondary | ICD-10-CM

## 2022-10-11 DIAGNOSIS — Z1211 Encounter for screening for malignant neoplasm of colon: Secondary | ICD-10-CM | POA: Insufficient documentation

## 2022-10-11 DIAGNOSIS — F17210 Nicotine dependence, cigarettes, uncomplicated: Secondary | ICD-10-CM | POA: Insufficient documentation

## 2022-10-11 DIAGNOSIS — I10 Essential (primary) hypertension: Secondary | ICD-10-CM | POA: Diagnosis not present

## 2022-10-11 HISTORY — DX: Diaphragmatic hernia without obstruction or gangrene: K44.9

## 2022-10-11 HISTORY — PX: COLONOSCOPY WITH PROPOFOL: SHX5780

## 2022-10-11 HISTORY — DX: Unspecified osteoarthritis, unspecified site: M19.90

## 2022-10-11 LAB — GLUCOSE, CAPILLARY: Glucose-Capillary: 113 mg/dL — ABNORMAL HIGH (ref 70–99)

## 2022-10-11 SURGERY — COLONOSCOPY WITH PROPOFOL
Anesthesia: General

## 2022-10-11 MED ORDER — PROPOFOL 10 MG/ML IV BOLUS
INTRAVENOUS | Status: DC | PRN
  Administered 2022-10-11: 80 mg via INTRAVENOUS

## 2022-10-11 MED ORDER — GLYCOPYRROLATE 0.2 MG/ML IJ SOLN
INTRAMUSCULAR | Status: DC | PRN
  Administered 2022-10-11: .2 mg via INTRAVENOUS

## 2022-10-11 MED ORDER — PROPOFOL 500 MG/50ML IV EMUL
INTRAVENOUS | Status: DC | PRN
  Administered 2022-10-11: 150 ug/kg/min via INTRAVENOUS

## 2022-10-11 MED ORDER — SODIUM CHLORIDE 0.9 % IV SOLN
INTRAVENOUS | Status: DC
  Administered 2022-10-11: 20 mL/h via INTRAVENOUS

## 2022-10-11 MED ORDER — EPHEDRINE SULFATE (PRESSORS) 50 MG/ML IJ SOLN
INTRAMUSCULAR | Status: DC | PRN
  Administered 2022-10-11: 10 mg via INTRAVENOUS

## 2022-10-11 MED ORDER — LIDOCAINE HCL (CARDIAC) PF 100 MG/5ML IV SOSY
PREFILLED_SYRINGE | INTRAVENOUS | Status: DC | PRN
  Administered 2022-10-11: 40 mg via INTRAVENOUS

## 2022-10-11 NOTE — Transfer of Care (Deleted)
Immediate Anesthesia Transfer of Care Note  Patient: Regina Pierce  Procedure(s) Performed: Procedure(s): COLONOSCOPY WITH PROPOFOL (N/A)  Patient Location: PACU and Endoscopy Unit  Anesthesia Type:General  Level of Consciousness: sedated  Airway & Oxygen Therapy: Patient Spontanous Breathing and Patient connected to nasal cannula oxygen  Post-op Assessment: Report given to RN and Post -op Vital signs reviewed and stable  Post vital signs: Reviewed and stable  Last Vitals:  Vitals:   10/11/22 0953 10/11/22 1030  BP: 135/66   Pulse: 77   Resp: 20   Temp: (!) 35.8 C (!) 36.2 C  SpO2: 100%     Complications: No apparent anesthesia complications

## 2022-10-11 NOTE — Anesthesia Postprocedure Evaluation (Signed)
Anesthesia Post Note  Patient: Regina Pierce  Procedure(s) Performed: COLONOSCOPY WITH PROPOFOL  Patient location during evaluation: PACU Anesthesia Type: General Level of consciousness: awake and alert, oriented and patient cooperative Pain management: pain level controlled Vital Signs Assessment: post-procedure vital signs reviewed and stable Respiratory status: spontaneous breathing, nonlabored ventilation and respiratory function stable Cardiovascular status: blood pressure returned to baseline and stable Postop Assessment: adequate PO intake Anesthetic complications: no   No notable events documented.   Last Vitals:  Vitals:   10/11/22 1040 10/11/22 1050  BP: 110/62 (!) 119/57  Pulse:    Resp: 14   Temp:    SpO2:      Last Pain:  Vitals:   10/11/22 1050  TempSrc:   PainSc: 0-No pain                 Reed Breech

## 2022-10-11 NOTE — Op Note (Signed)
[facility] Patient Name: Regina Pierce Procedure Date: 10/11/2022 10:11 AM MRN: [id number] Account #: [id number] Date of Birth: [birth year] Admit Type: Outpatient Age: 67 Room: [facility] ENDO ROOM 1 Gender: Female Note Status: Finalized Instrument Name: Prentice Docker [id number] Procedure:             Colonoscopy Indications:           Surveillance: History of numerous (> 10) adenomas on                         last colonoscopy (< 3 yrs), Last colonoscopy: May 2023 Providers:             [place] Mood MD, MD Referring MD:          Daryl Eastern. Suzie Portela (Referring MD) Medicines:             Monitored Anesthesia Care Complications:         No immediate complications. Procedure:             Pre-Anesthesia Assessment:                        - Prior to the procedure, a History and Physical was                         performed, and patient medications, allergies and                         sensitivities were reviewed. The patient's tolerance                         of previous anesthesia was reviewed.                        - The risks and benefits of the procedure and the                         sedation options and risks were discussed with the                         patient. All questions were answered and informed                         consent was obtained.                        - After reviewing the risks and benefits, the patient                         was deemed in satisfactory condition to undergo the                         procedure.                        - ASA Grade Assessment: II - A patient with mild                         systemic disease.  After obtaining informed consent, the colonoscope was                         passed under direct vision. Throughout the procedure,                         the patient's blood pressure, pulse, and oxygen                         saturations were monitored continuously. The                          Colonoscope was introduced through the anus and                         advanced to the the cecum, identified by the                         appendiceal orifice. The colonoscopy was performed                         with ease. The patient tolerated the procedure well.                         The quality of the bowel preparation was excellent.                         Anatomical landmarks were photographed. Findings:      The perianal and digital rectal examinations were normal.      The entire examined colon appeared normal on direct and retroflexion       views. Impression:            - The entire examined colon is normal on direct and                         retroflexion views.                        - No specimens collected. Recommendation:        - Discharge patient to home (with escort).                        - Resume previous diet.                        - Continue present medications.                        - Repeat colonoscopy in 3 years for surveillance. Procedure Code(s):     --- Professional ---                        [phone], Colonoscopy, flexible; diagnostic, including                         collection of specimen(s) by brushing or washing, when                         performed (separate procedure) Diagnosis Code(s):     --- Professional ---  Z86.010, Personal history of colonic polyps CPT copyright 2022 American [employer] Association. All rights reserved. The codes documented in this report are preliminary and upon coder review may  be revised to meet current compliance requirements. Wyline Mood, MD Wyline Mood MD, MD 10/11/2022 10:30:02 AM This report has been signed electronically. Number of Addenda: 0 Note Initiated On: 10/11/2022 10:11 AM Scope Withdrawal Time: 0 hours 8 minutes 32 seconds  Total Procedure Duration: 0 hours 11 minutes 43 seconds  Estimated Blood Loss:  Estimated blood loss: none.      [facility]

## 2022-10-11 NOTE — Anesthesia Preprocedure Evaluation (Addendum)
Anesthesia Evaluation  Patient identified by MRN, date of birth, ID band Patient awake    Reviewed: Allergy & Precautions, NPO status , Patient's Chart, lab work & pertinent test results  History of Anesthesia Complications Negative for: history of anesthetic complications  Airway Mallampati: III   Neck ROM: Full    Dental  (+) Edentulous Upper, Edentulous Lower   Pulmonary Current Smoker (1 ppd)Patient did not abstain from smoking.   Pulmonary exam normal breath sounds clear to auscultation       Cardiovascular hypertension, + Peripheral Vascular Disease  Normal cardiovascular exam Rhythm:Regular Rate:Normal     Neuro/Psych  PSYCHIATRIC DISORDERS Anxiety Depression     Neuromuscular disease (neuropathy)    GI/Hepatic negative GI ROS,,,  Endo/Other  diabetes, Type 2Hypothyroidism  Obesity   Renal/GU Renal disease (CKD)     Musculoskeletal  (+) Arthritis ,    Abdominal   Peds  Hematology negative hematology ROS (+)   Anesthesia Other Findings   Reproductive/Obstetrics                             Anesthesia Physical Anesthesia Plan  ASA: 2  Anesthesia Plan: General   Post-op Pain Management:    Induction: Intravenous  PONV Risk Score and Plan: 2 and Propofol infusion, TIVA and Treatment may vary due to age or medical condition  Airway Management Planned: Natural Airway  Additional Equipment:   Intra-op Plan:   Post-operative Plan:   Informed Consent: I have reviewed the patients History and Physical, chart, labs and discussed the procedure including the risks, benefits and alternatives for the proposed anesthesia with the patient or authorized representative who has indicated his/her understanding and acceptance.       Plan Discussed with: CRNA  Anesthesia Plan Comments: (LMA/GETA backup discussed.  Patient consented for risks of anesthesia including but not limited to:   - adverse reactions to medications - damage to eyes, teeth, lips or other oral mucosa - nerve damage due to positioning  - sore throat or hoarseness - damage to heart, brain, nerves, lungs, other parts of body or loss of life  Informed patient about role of CRNA in peri- and intra-operative care.  Patient voiced understanding.)        Anesthesia Quick Evaluation

## 2022-10-11 NOTE — H&P (Signed)
Wyline Mood, MD [address], [address], [place], [place], [address] [address], [place], [place], [address] Phone: [phone]  Fax: [phone]  Primary Care Physician:  Jacky Kindle, FNP   Pre-Procedure History & Physical: HPI:  Regina Pierce is a 67 y.o. female is here for an colonoscopy.   Past Medical History:  Diagnosis Date   Anxiety    Arthritis    Depression    Diabetes mellitus without complication (HCC)    no meds   Diaphragmatic hernia    Hyperlipidemia    Hypertension    Thyroid disease     Past Surgical History:  Procedure Laterality Date   ACHILLES TENDON SURGERY Left 05/12/2016   Procedure: ACHILLES TENDON REPAIR;  Surgeon: Gwyneth Revels, DPM;  Location: [facility] ORS;  Service: Podiatry;  Laterality: Left;   BONE EXOSTOSIS EXCISION Left 05/12/2016   Procedure: CALCANEAL EXOSTECTOMY AND DORSAL EXOSTECTOMY;  Surgeon: Gwyneth Revels, DPM;  Location: [facility] ORS;  Service: Podiatry;  Laterality: Left;   CARPAL TUNNEL RELEASE     COLONOSCOPY WITH PROPOFOL N/A 07/20/2016   Procedure: COLONOSCOPY WITH PROPOFOL;  Surgeon: [place] Mood, MD;  Location: [facility] ENDOSCOPY;  Service: Endoscopy;  Laterality: N/A;   COLONOSCOPY WITH PROPOFOL N/A 09/09/2021   Procedure: COLONOSCOPY WITH PROPOFOL;  Surgeon: [place] Mood, MD;  Location: [facility] ENDOSCOPY;  Service: Gastroenterology;  Laterality: N/A;   HAND SURGERY Bilateral 04/26/1997   carpal tunnel    KNEE ARTHROSCOPY W/ MENISCAL TRANSPLANT     KNEE SURGERY  04/26/2008   TUBAL LIGATION  04/26/1985    Prior to Admission medications   Medication Sig Start Date End Date Taking? Authorizing Provider  aspirin 81 MG tablet Take 81 mg by mouth every evening.     [provider]  cholecalciferol (VITAMIN D3) 25 MCG (1000 UNIT) tablet Take 2,000 Units by mouth daily.    [provider]  gabapentin (NEURONTIN) 100 MG capsule Take 1 capsule (100 mg total) by mouth at bedtime. 07/01/22   Jacky Kindle, FNP   levothyroxine (SYNTHROID) 125 MCG tablet Take 1 tablet (125 mcg total) by mouth daily. 08/24/22   Jacky Kindle, FNP  lisinopril-hydrochlorothiazide (ZESTORETIC) 10-12.5 MG tablet Take 1 tablet by mouth daily. 07/01/22   Jacky Kindle, FNP  metFORMIN (GLUCOPHAGE-XR) 750 MG 24 hr tablet Take 1 tablet (750 mg total) by mouth 2 (two) times daily after a meal. 07/01/22   Jacky Kindle, FNP  naproxen sodium (ANAPROX) 220 MG tablet Take 440 mg by mouth daily as needed (pain).    [provider]  simvastatin (ZOCOR) 40 MG tablet Take 1 tablet (40 mg total) by mouth at bedtime. 07/01/22   Jacky Kindle, FNP    Allergies as of 09/08/2022   (No Known Allergies)    Family History  Problem Relation Age of Onset   Hypertension Sister    Breast cancer Neg Hx    Colon cancer Neg Hx     Social History   Socioeconomic History   Marital status: Married    Spouse name: Not on file   Number of children: Not on file   Years of education: Not on file   Highest education level: Not on file  Occupational History   Not on file  Tobacco Use   Smoking status: Every Day    Packs/day: 3.00    Years: 50.00    Additional pack years: 0.00    Total pack years: 150.00    Types: Cigarettes  Smokeless tobacco: Never  Vaping Use   Vaping Use: Never used  Substance and Sexual Activity   Alcohol use: No    Alcohol/week: 0.0 standard drinks of alcohol   Drug use: No   Sexual activity: Not Currently    Birth control/protection: Post-menopausal  Other Topics Concern   Not on file  Social History Narrative   Not on file   Social Determinants of Health   Financial Resource Strain: Not on file  Food Insecurity: Not on file  Transportation Needs: Not on file  Physical Activity: Not on file  Stress: Not on file  Social Connections: Not on file  Intimate Partner Violence: Not on file    Review of Systems: See HPI, otherwise negative ROS  Physical Exam: LMP 04/27/2007  General:   Alert,   pleasant and cooperative in NAD Head:  Normocephalic and atraumatic. Neck:  Supple; no masses or thyromegaly. Lungs:  Clear throughout to auscultation, normal respiratory effort.    Heart:  +S1, +S2, Regular rate and rhythm, No edema. Abdomen:  Soft, nontender and nondistended. Normal bowel sounds, without guarding, and without rebound.   Neurologic:  Alert and  oriented x4;  grossly normal neurologically.  Impression/Plan: Regina Pierce is here for an colonoscopy to be performed for surveillance due to prior history of colon polyps   Risks, benefits, limitations, and alternatives regarding  colonoscopy have been reviewed with the patient.  Questions have been answered.  All parties agreeable.   [place] Mood, MD  10/11/2022, 9:22 AM

## 2022-10-11 NOTE — Transfer of Care (Signed)
Immediate Anesthesia Transfer of Care Note  Patient: Regina Pierce  Procedure(s) Performed: Procedure(s): COLONOSCOPY WITH PROPOFOL (N/A)  Patient Location: PACU and Endoscopy Unit  Anesthesia Type:General  Level of Consciousness: sedated  Airway & Oxygen Therapy: Patient Spontanous Breathing and Patient connected to nasal cannula oxygen  Post-op Assessment: Report given to RN and Post -op Vital signs reviewed and stable  Post vital signs: Reviewed and stable  Last Vitals:  Vitals:   10/11/22 0953 10/11/22 1030  BP: 135/66 (!) 108/36  Pulse: 77 91  Resp: 20 (!) 23  Temp: (!) 35.8 C (!) 36.2 C  SpO2: 100% 93%    Complications: No apparent anesthesia complications

## 2022-10-12 ENCOUNTER — Encounter: Payer: Self-pay | Admitting: Gastroenterology

## 2022-11-01 ENCOUNTER — Encounter: Payer: Self-pay | Admitting: Family Medicine

## 2022-11-08 ENCOUNTER — Encounter: Payer: Self-pay | Admitting: Family Medicine

## 2022-11-08 MED ORDER — LEVOTHYROXINE SODIUM 125 MCG PO TABS: 125.0000 ug | ORAL_TABLET | Freq: Every day | ORAL | 3 refills | Status: DC

## 2022-11-16 DIAGNOSIS — I10 Essential (primary) hypertension: Secondary | ICD-10-CM | POA: Diagnosis not present

## 2022-11-16 DIAGNOSIS — E785 Hyperlipidemia, unspecified: Secondary | ICD-10-CM | POA: Diagnosis not present

## 2022-11-16 DIAGNOSIS — R809 Proteinuria, unspecified: Secondary | ICD-10-CM | POA: Diagnosis not present

## 2022-11-16 DIAGNOSIS — G629 Polyneuropathy, unspecified: Secondary | ICD-10-CM | POA: Diagnosis not present

## 2022-11-16 DIAGNOSIS — E1122 Type 2 diabetes mellitus with diabetic chronic kidney disease: Secondary | ICD-10-CM | POA: Diagnosis not present

## 2022-11-17 LAB — CBC AND DIFFERENTIAL
HCT: 42 (ref 36–46)
Hemoglobin: 14.4 (ref 12.0–16.0)
Neutrophils Absolute: 4026
Platelets: 143 10*3/uL — AB (ref 150–400)
WBC: 6.6

## 2022-11-17 LAB — BASIC METABOLIC PANEL WITH GFR
BUN: 14 (ref 4–21)
CO2: 29 — AB (ref 13–22)
Chloride: 107 (ref 99–108)
Creatinine: 0.8 (ref 0.5–1.1)
Glucose: 124
Potassium: 4.1 meq/L (ref 3.5–5.1)
Sodium: 143 (ref 137–147)

## 2022-11-17 LAB — COMPREHENSIVE METABOLIC PANEL WITH GFR
Albumin: 4 (ref 3.5–5.0)
eGFR: 83

## 2022-11-17 LAB — CBC: RBC: 4.57 (ref 3.87–5.11)

## 2022-11-17 LAB — PROTEIN / CREATININE RATIO, URINE: Creatinine, Urine: 131

## 2022-11-23 ENCOUNTER — Other Ambulatory Visit: Payer: Self-pay | Admitting: Nephrology

## 2022-11-23 DIAGNOSIS — E1122 Type 2 diabetes mellitus with diabetic chronic kidney disease: Secondary | ICD-10-CM

## 2022-11-23 DIAGNOSIS — E785 Hyperlipidemia, unspecified: Secondary | ICD-10-CM

## 2022-11-23 DIAGNOSIS — R809 Proteinuria, unspecified: Secondary | ICD-10-CM

## 2022-11-23 DIAGNOSIS — G629 Polyneuropathy, unspecified: Secondary | ICD-10-CM

## 2022-11-23 DIAGNOSIS — I10 Essential (primary) hypertension: Secondary | ICD-10-CM

## 2022-12-03 ENCOUNTER — Ambulatory Visit
Admission: RE | Admit: 2022-12-03 | Discharge: 2022-12-03 | Disposition: A | Discharge status: Home / Self Care | Payer: Medicare HMO | Source: Ambulatory Visit | Attending: Acute Care | Admitting: Acute Care

## 2022-12-03 ENCOUNTER — Encounter: Payer: Self-pay | Admitting: Family Medicine

## 2022-12-03 DIAGNOSIS — Z122 Encounter for screening for malignant neoplasm of respiratory organs: Secondary | ICD-10-CM

## 2022-12-03 DIAGNOSIS — Z87891 Personal history of nicotine dependence: Secondary | ICD-10-CM | POA: Insufficient documentation

## 2022-12-03 DIAGNOSIS — F17210 Nicotine dependence, cigarettes, uncomplicated: Secondary | ICD-10-CM | POA: Diagnosis not present

## 2022-12-09 ENCOUNTER — Other Ambulatory Visit: Payer: Self-pay

## 2022-12-09 DIAGNOSIS — Z87891 Personal history of nicotine dependence: Secondary | ICD-10-CM

## 2022-12-09 DIAGNOSIS — F17210 Nicotine dependence, cigarettes, uncomplicated: Secondary | ICD-10-CM

## 2022-12-09 DIAGNOSIS — Z122 Encounter for screening for malignant neoplasm of respiratory organs: Secondary | ICD-10-CM

## 2022-12-12 ENCOUNTER — Encounter: Payer: Self-pay | Admitting: Pharmacist

## 2022-12-12 NOTE — Progress Notes (Signed)
Pharmacy Quality Measure Review  This patient is appearing on a report for being at risk of failing the adherence measure for hypertension (ACEi/ARB) medications this calendar year.   Medication: lisinopril/hydrochlorothiazide 10-12.5 mg daily Last fill date: 12/03/22 for 90 day supply  Insurance report was not up to date. No action needed at this time.   Adam Phenix, PharmD PGY-1 Pharmacy Resident

## 2022-12-12 NOTE — Progress Notes (Signed)
Pharmacy Quality Measure Review  This patient is appearing on a report for being at risk of failing the adherence measure for diabetes and cholesterol (statin) medications this calendar year.   Medication: metformin 750 mg ER - 1 tab BID Last fill date: 12/03/22 for 90 day supply  Medication: simvastatin 40 mg daily Last fill date: 12/03/22  for 90 day supply  Insurance report was not up to date. No action needed at this time.   Adam Phenix, PharmD PGY-1 Pharmacy Resident

## 2023-01-24 IMAGING — MG MM DIGITAL SCREENING BILAT W/ TOMO AND CAD
8 series · 8 of 24 positions shown · non-contrast
Comparison: Previous exam(s).

CLINICAL DATA: Screening.

EXAM:
DIGITAL SCREENING BILATERAL MAMMOGRAM WITH TOMOSYNTHESIS AND CAD
TECHNIQUE: Bilateral screening digital craniocaudal and mediolateral oblique
mammograms were obtained. Bilateral screening digital breast
tomosynthesis was performed. The images were evaluated with
computer-aided detection.

[R CC synth-2D]
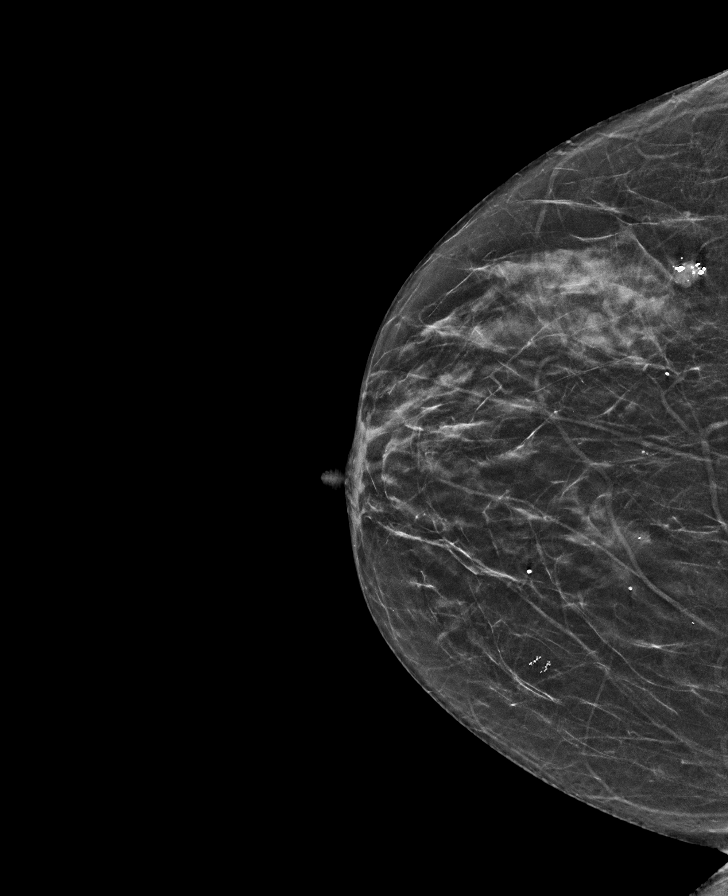

[L MLO synth-2D]
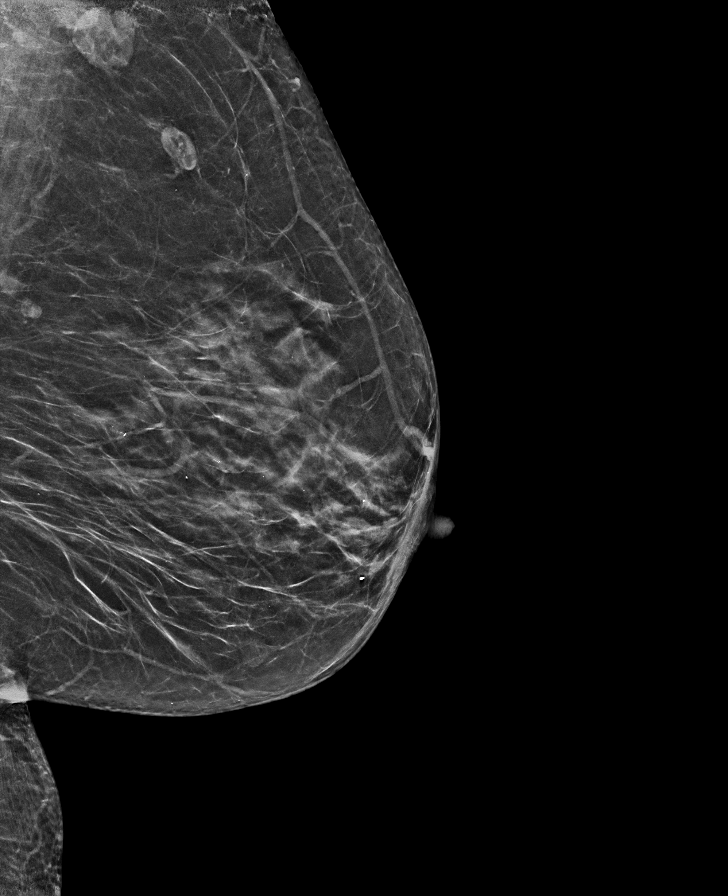

[R MLO synth-2D]
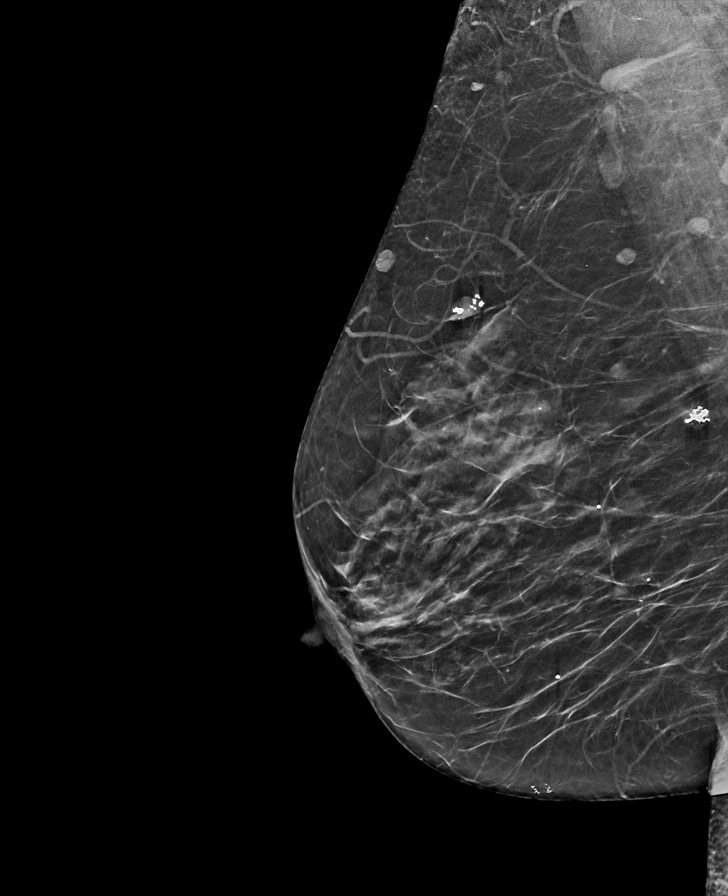

[L CC synth-2D]
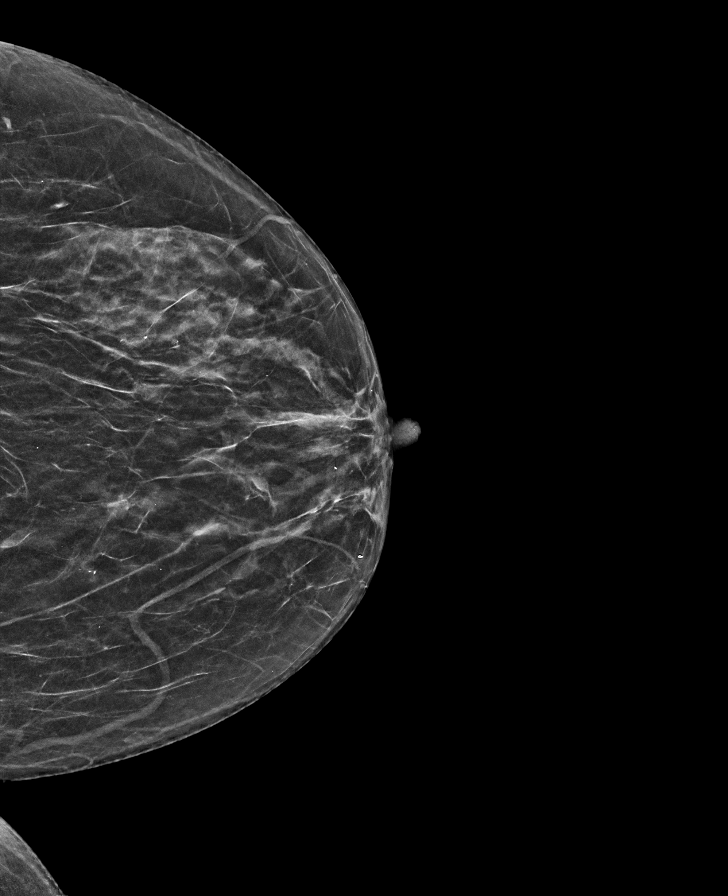

[R CC tomo · tomo slice 27/53.0]
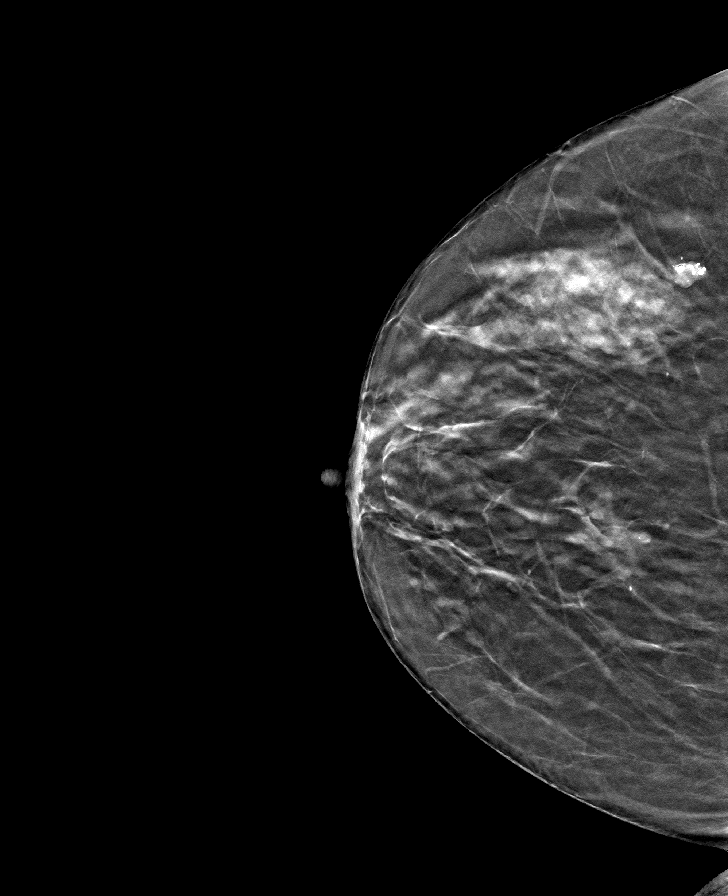

[L MLO tomo · tomo slice 29/57.0]
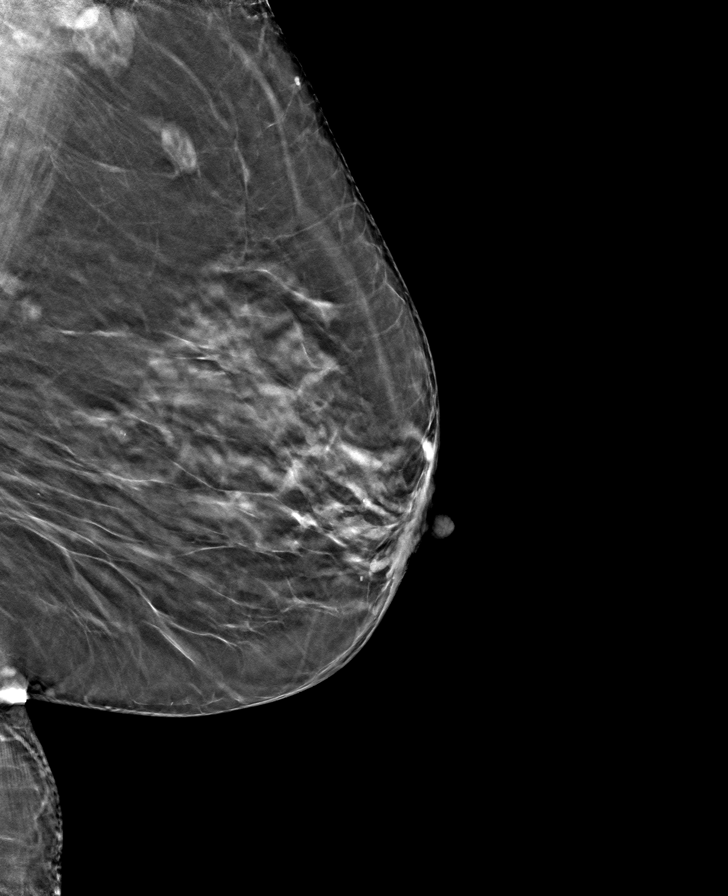

[L CC tomo · tomo slice 25/48.0]
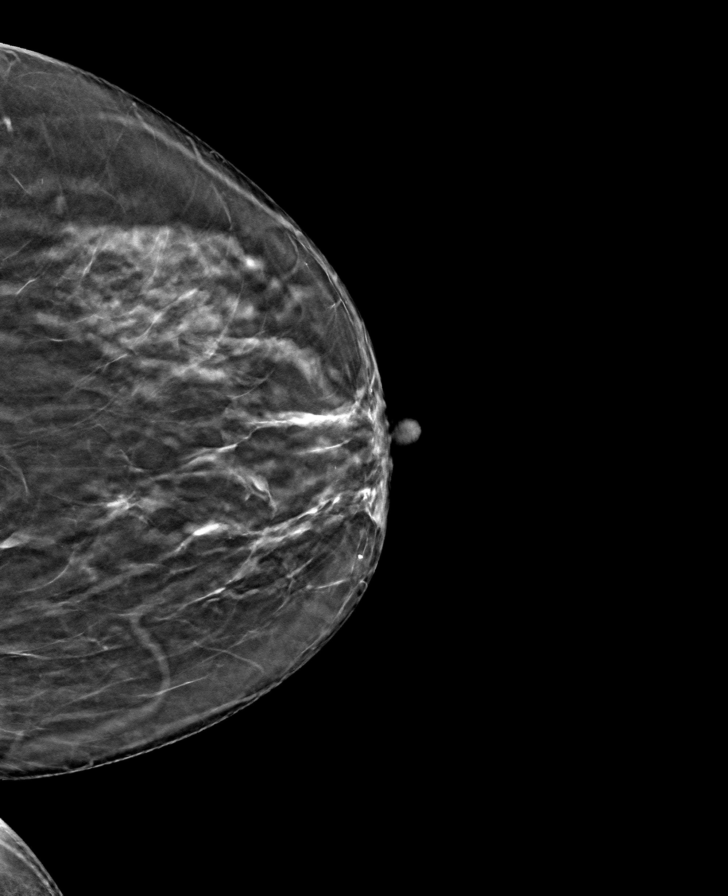

[R MLO tomo · tomo slice 31/62.0]
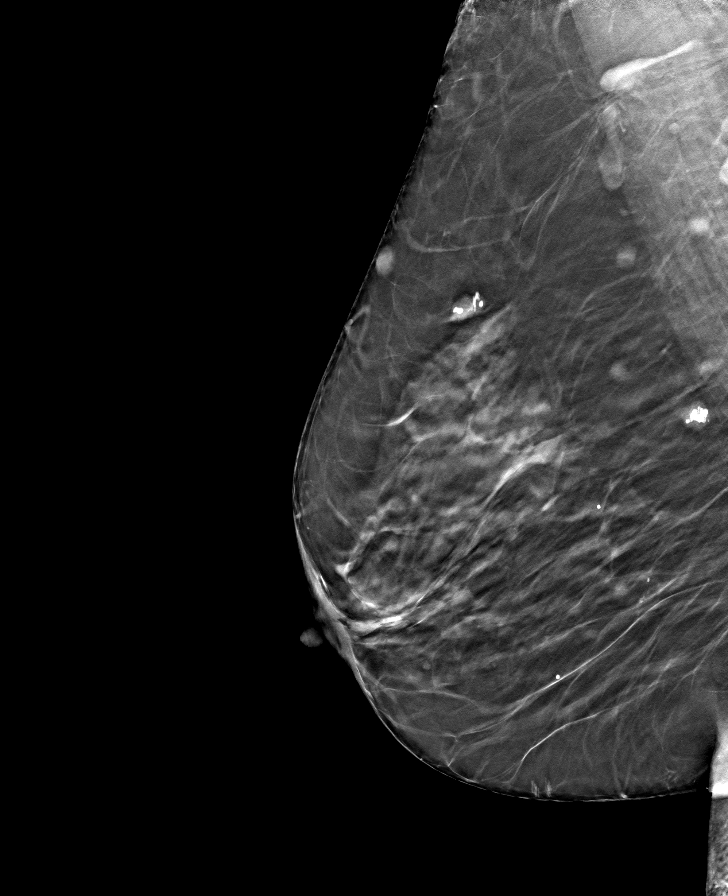

[8 of 24 positions shown; findings below may reference images not displayed]

ACR Breast Density Category c: The breast tissue is heterogeneously
dense, which may obscure small masses.
FINDINGS: There are no findings suspicious for malignancy.
IMPRESSION: No mammographic evidence of malignancy. A result letter of this
screening mammogram will be mailed directly to the patient.

RECOMMENDATION:
Screening mammogram in one year. (Code:[id number])

BI-RADS CATEGORY  1: Negative.

## 2023-05-26 ENCOUNTER — Telehealth: Payer: Self-pay | Admitting: Family Medicine

## 2023-05-26 NOTE — Telephone Encounter (Signed)
[facility] faxed refill request for the following medications:   lisinopril-hydrochlorothiazide (ZESTORETIC) 10-12.5 MG tablet    metFORMIN (GLUCOPHAGE-XR) 750 MG 24 hr tablet    gabapentin (NEURONTIN) 100 MG capsule    simvastatin (ZOCOR) 40 MG tablet     Please advise.

## 2023-05-27 ENCOUNTER — Other Ambulatory Visit: Payer: Self-pay

## 2023-05-27 DIAGNOSIS — E1159 Type 2 diabetes mellitus with other circulatory complications: Secondary | ICD-10-CM

## 2023-05-27 DIAGNOSIS — E1165 Type 2 diabetes mellitus with hyperglycemia: Secondary | ICD-10-CM

## 2023-05-27 MED ORDER — METFORMIN HCL ER 750 MG PO TB24
750.0000 mg | ORAL_TABLET | Freq: Two times a day (BID) | ORAL | 0 refills | Status: DC
Start: 2023-05-27 — End: 2023-06-30

## 2023-05-27 MED ORDER — LISINOPRIL-HYDROCHLOROTHIAZIDE 10-12.5 MG PO TABS
1.0000 | ORAL_TABLET | Freq: Every day | ORAL | 0 refills | Status: DC
Start: 2023-05-27 — End: 2023-09-06

## 2023-05-27 MED ORDER — GABAPENTIN 100 MG PO CAPS: 100.0000 mg | ORAL_CAPSULE | Freq: Every day | ORAL | 0 refills | Status: DC

## 2023-05-27 MED ORDER — SIMVASTATIN 40 MG PO TABS: 40.0000 mg | ORAL_TABLET | Freq: Every day | ORAL | 0 refills | Status: DC

## 2023-06-30 ENCOUNTER — Other Ambulatory Visit: Payer: Self-pay | Admitting: Family Medicine

## 2023-06-30 DIAGNOSIS — E1165 Type 2 diabetes mellitus with hyperglycemia: Secondary | ICD-10-CM

## 2023-08-10 ENCOUNTER — Encounter: Admitting: Family Medicine

## 2023-08-30 ENCOUNTER — Ambulatory Visit: Admitting: Family Medicine

## 2023-08-30 ENCOUNTER — Telehealth: Payer: Self-pay

## 2023-08-30 ENCOUNTER — Encounter: Payer: Self-pay | Admitting: Family Medicine

## 2023-08-30 VITALS — BP 133/64 | HR 78 | Resp 16 | Ht 64.5 in | Wt 202.0 lb

## 2023-08-30 DIAGNOSIS — E1159 Type 2 diabetes mellitus with other circulatory complications: Secondary | ICD-10-CM

## 2023-08-30 DIAGNOSIS — E034 Atrophy of thyroid (acquired): Secondary | ICD-10-CM

## 2023-08-30 DIAGNOSIS — E1169 Type 2 diabetes mellitus with other specified complication: Secondary | ICD-10-CM | POA: Diagnosis not present

## 2023-08-30 DIAGNOSIS — Z79899 Other long term (current) drug therapy: Secondary | ICD-10-CM

## 2023-08-30 DIAGNOSIS — E559 Vitamin D deficiency, unspecified: Secondary | ICD-10-CM | POA: Diagnosis not present

## 2023-08-30 DIAGNOSIS — Z0000 Encounter for general adult medical examination without abnormal findings: Secondary | ICD-10-CM

## 2023-08-30 DIAGNOSIS — Z0001 Encounter for general adult medical examination with abnormal findings: Secondary | ICD-10-CM | POA: Diagnosis not present

## 2023-08-30 DIAGNOSIS — E785 Hyperlipidemia, unspecified: Secondary | ICD-10-CM | POA: Diagnosis not present

## 2023-08-30 DIAGNOSIS — E1140 Type 2 diabetes mellitus with diabetic neuropathy, unspecified: Secondary | ICD-10-CM

## 2023-08-30 DIAGNOSIS — I739 Peripheral vascular disease, unspecified: Secondary | ICD-10-CM

## 2023-08-30 DIAGNOSIS — I152 Hypertension secondary to endocrine disorders: Secondary | ICD-10-CM

## 2023-08-30 DIAGNOSIS — F339 Major depressive disorder, recurrent, unspecified: Secondary | ICD-10-CM

## 2023-08-30 DIAGNOSIS — Z1231 Encounter for screening mammogram for malignant neoplasm of breast: Secondary | ICD-10-CM

## 2023-08-30 DIAGNOSIS — Z780 Asymptomatic menopausal state: Secondary | ICD-10-CM

## 2023-08-30 MED ORDER — SIMVASTATIN 40 MG PO TABS: 40.0000 mg | ORAL_TABLET | Freq: Every day | ORAL | 0 refills | Status: DC

## 2023-08-30 MED ORDER — GABAPENTIN 100 MG PO CAPS: 100.0000 mg | ORAL_CAPSULE | Freq: Every day | ORAL | 0 refills | Status: DC

## 2023-08-30 MED ORDER — METFORMIN HCL ER 750 MG PO TB24: 750.0000 mg | ORAL_TABLET | Freq: Two times a day (BID) | ORAL | 0 refills | Status: DC

## 2023-08-30 NOTE — Progress Notes (Signed)
 Annual Wellness Visit     Patient: Regina Pierce, Female    DOB: 01/17/1956, 68 y.o.   MRN: [id number] Visit Date: 08/30/2023  Today's Provider: Carlean Charter, DO   Chief Complaint  Patient presents with   Medicare Wellness   Annual Exam   Subjective    Regina Pierce is a 68 y.o. female who presents today for a complete physical exam.  She reports consuming a general diet. She walks for about twenty minutes three times a week. She generally feels well. She reports sleeping fairly well but sometimes has trouble falling asleep. She uses melatonin gummies sometimes. She does not have additional problems to discuss today.    HPI ANALEISE MCCLEERY is a 68 year old female who presents for an annual physical exam.  She is feeling well overall with no specific concerns. She experiences occasional difficulty falling asleep and uses over-the-counter melatonin gummies, which she finds effective.  She has diabetes and takes metformin  twice daily. Her medication regimen also includes gabapentin , levothyroxine , simvastatin , and an 81 mg aspirin daily. She regularly checks her feet due to neuropathy and reports no new issues.   She has a history of smoking one pack of cigarettes per day since age 73. She previously attempted to quit using Wellbutrin  but found it challenging. She has undergone lung cancer screening in the past and continues to follow up on this. She walks for exercise about three times a week for twenty minutes each session and does not follow any specific diet restrictions.  She has a family history of shingles, as her father had it, but she has not received the shingles vaccine. She also has not had a pneumonia vaccine and declined the COVID vaccine.  She has a history of low vitamin D  levels but is not currently taking vitamin D  supplements. She follows up with nephrology for her kidney health but is not currently scheduled for an appointment.    Medications: Outpatient  Medications Prior to Visit  Medication Sig   aspirin 81 MG tablet Take 81 mg by mouth every evening.    levothyroxine  (SYNTHROID ) 125 MCG tablet Take 1 tablet (125 mcg total) by mouth daily.   naproxen sodium (ANAPROX) 220 MG tablet Take 440 mg by mouth daily as needed (pain).   [DISCONTINUED] gabapentin  (NEURONTIN ) 100 MG capsule Take 1 capsule (100 mg total) by mouth at bedtime.   [DISCONTINUED] lisinopril -hydrochlorothiazide  (ZESTORETIC ) 10-12.5 MG tablet Take 1 tablet by mouth daily.   [DISCONTINUED] metFORMIN  (GLUCOPHAGE -XR) 750 MG 24 hr tablet TAKE ONE TABLET (750 MG TOTAL) BY MOUTH TWO (TWO) TIMES DAILY AFTER A MEAL.   [DISCONTINUED] simvastatin  (ZOCOR ) 40 MG tablet Take 1 tablet (40 mg total) by mouth at bedtime.   [DISCONTINUED] cholecalciferol (VITAMIN D3) 25 MCG (1000 UNIT) tablet Take 2,000 Units by mouth daily.   No facility-administered medications prior to visit.    No Known Allergies  Patient Care Team: Garielle Mroz, Asencion Blacksmith, DO as PCP - General (Family Medicine)  Review of Systems  Constitutional:  Negative for chills, fatigue and fever.  HENT:  Negative for congestion, ear pain, rhinorrhea, sneezing and sore throat.   Eyes: Negative.  Negative for pain and redness.  Respiratory:  Negative for cough, shortness of breath and wheezing.   Cardiovascular:  Negative for chest pain and leg swelling.  Gastrointestinal:  Negative for abdominal pain, blood in stool, constipation, diarrhea and nausea.  Endocrine: Negative for polydipsia and polyphagia.  Genitourinary: Negative.  Negative  for dysuria, flank pain, hematuria, pelvic pain, vaginal bleeding and vaginal discharge.  Musculoskeletal:  Negative for arthralgias, back pain, gait problem and joint swelling.  Skin:  Negative for rash.  Neurological: Negative.  Negative for dizziness, tremors, seizures, weakness, light-headedness, numbness and headaches.  Hematological:  Negative for adenopathy.  Psychiatric/Behavioral: Negative.   Negative for behavioral problems, confusion and dysphoric mood. The patient is not nervous/anxious and is not hyperactive.          Objective    Vitals: BP 133/64 (BP Location: Right Arm, Patient Position: Sitting, Cuff Size: Normal)   Pulse 78   Resp 16   Ht 5' 4.5" (1.638 m)   Wt 202 lb (91.6 kg)   LMP 04/27/2007   SpO2 97%   BMI 34.14 kg/m      Physical Exam Vitals and nursing note reviewed.  Constitutional:      General: She is awake.     Appearance: Normal appearance.  HENT:     Head: Normocephalic and atraumatic.     Right Ear: Tympanic membrane, ear canal and external ear normal.     Left Ear: Tympanic membrane, ear canal and external ear normal.     Nose: Nose normal.     Mouth/Throat:     Mouth: Mucous membranes are moist.     Pharynx: Oropharynx is clear. No oropharyngeal exudate or posterior oropharyngeal erythema.  Eyes:     General: No scleral icterus.    Extraocular Movements: Extraocular movements intact.     Conjunctiva/sclera: Conjunctivae normal.     Pupils: Pupils are equal, round, and reactive to light.  Neck:     Thyroid : No thyromegaly or thyroid  tenderness.  Cardiovascular:     Rate and Rhythm: Normal rate and regular rhythm.     Pulses: Normal pulses.          Dorsalis pedis pulses are 2+ on the right side and 2+ on the left side.       Posterior tibial pulses are 2+ on the right side and 2+ on the left side.     Heart sounds: Normal heart sounds.  Pulmonary:     Effort: Pulmonary effort is normal. No tachypnea, bradypnea or respiratory distress.     Breath sounds: Normal breath sounds. No stridor. No wheezing, rhonchi or rales.  Abdominal:     General: Bowel sounds are normal. There is no distension.     Palpations: Abdomen is soft. There is no mass.     Tenderness: There is no abdominal tenderness. There is no guarding.     Hernia: No hernia is present.  Musculoskeletal:     Cervical back: Normal range of motion and neck supple.      Right lower leg: No edema.     Left lower leg: No edema.     Right foot: Normal range of motion. No deformity, bunion, Charcot foot, foot drop or prominent metatarsal heads.     Left foot: Normal range of motion. No deformity, bunion, Charcot foot, foot drop or prominent metatarsal heads.  Feet:     Right foot:     Protective Sensation: 10 sites tested.  10 sites sensed.     Skin integrity: No ulcer, blister, skin breakdown, erythema, warmth, callus, dry skin or fissure.     Toenail Condition: Right toenails are normal.     Left foot:     Protective Sensation: 10 sites tested.  10 sites sensed.     Skin integrity: No ulcer, blister, skin  breakdown, erythema, warmth, callus, dry skin or fissure.     Toenail Condition: Left toenails are normal.  Lymphadenopathy:     Cervical: No cervical adenopathy.  Skin:    General: Skin is warm and dry.     Comments: Fingernails long; +nicotine  staining to fingernails  Neurological:     Mental Status: She is alert and oriented to person, place, and time. Mental status is at baseline.  Psychiatric:        Mood and Affect: Mood normal.        Behavior: Behavior normal.     Most recent functional status assessment:    08/30/2023    2:53 PM  In your present state of health, do you have any difficulty performing the following activities:  Hearing? 0  Vision? 0  Difficulty concentrating or making decisions? 0  Walking or climbing stairs? 0  Dressing or bathing? 0  Doing errands, shopping? 0   Most recent fall risk assessment:    08/30/2023    2:57 PM  Fall Risk   Falls in the past year? 0  Number falls in past yr: 0  Injury with Fall? 0  Risk for fall due to : No Fall Risks    Most recent depression screenings:    08/30/2023    2:57 PM 08/19/2022    4:13 PM  PHQ 2/9 Scores  PHQ - 2 Score 0 0  PHQ- 9 Score 2 1   Most recent cognitive screening:    08/30/2023    2:53 PM  6CIT Screen  What Year? 0 points  What month? 0 points  What  time? 0 points  Count back from 20 0 points  Months in reverse 0 points  Repeat phrase 2 points  Total Score 2 points   Most recent Audit-C alcohol use screening    08/30/2023    2:51 PM  Alcohol Use Disorder Test (AUDIT)  1. How often do you have a drink containing alcohol? 0  2. How many drinks containing alcohol do you have on a typical day when you are drinking? 0  3. How often do you have six or more drinks on one occasion? 0  AUDIT-C Score 0   A score of 3 or more in women, and 4 or more in men indicates increased risk for alcohol abuse, EXCEPT if all of the points are from question 1   Results for orders placed or performed in visit on 08/30/23  Hemoglobin A1c  Result Value Ref Range   Hgb A1c MFr Bld 6.4 (H) 4.8 - 5.6 %   Est. average glucose Bld gHb Est-mCnc 137 mg/dL  Lipid Panel With LDL/HDL Ratio  Result Value Ref Range   Cholesterol, Total 142 100 - 199 mg/dL   Triglycerides 098 (H) 0 - 149 mg/dL   HDL 36 (L) >11 mg/dL   VLDL Cholesterol Cal 35 5 - 40 mg/dL   LDL Chol Calc (NIH) 71 0 - 99 mg/dL   LDL/HDL Ratio 2.0 0.0 - 3.2 ratio  TSH + free T4  Result Value Ref Range   TSH 10.400 (H) 0.450 - 4.500 uIU/mL   Free T4 1.77 0.82 - 1.77 ng/dL  Comprehensive metabolic panel with GFR  Result Value Ref Range   Glucose 104 (H) 70 - 99 mg/dL   BUN 20 8 - 27 mg/dL   Creatinine, Ser 9.14 0.57 - 1.00 mg/dL   eGFR 74 >78 GN/FAO/1.30   BUN/Creatinine Ratio 23 12 - 28   Sodium  141 134 - 144 mmol/L   Potassium 4.0 3.5 - 5.2 mmol/L   Chloride 99 96 - 106 mmol/L   CO2 26 20 - 29 mmol/L   Calcium 9.7 8.7 - 10.3 mg/dL   Total Protein 7.0 6.0 - 8.5 g/dL   Albumin 4.7 3.9 - 4.9 g/dL   Globulin, Total 2.3 1.5 - 4.5 g/dL   Bilirubin Total 1.0 0.0 - 1.2 mg/dL   Alkaline Phosphatase 71 44 - 121 IU/L   AST 17 0 - 40 IU/L   ALT 18 0 - 32 IU/L  Microalbumin / creatinine urine ratio  Result Value Ref Range   Creatinine, Urine 108.1 Not Estab. mg/dL   Microalbumin, Urine 440.1 Not  Estab. ug/mL   Microalb/Creat Ratio 423 (H) 0 - 29 mg/g creat  VITAMIN D  25 Hydroxy (Vit-D Deficiency, Fractures)  Result Value Ref Range   Vit D, 25-Hydroxy 40.1 30.0 - 100.0 ng/mL  Vitamin B12  Result Value Ref Range   Vitamin B-12 448 232 - 1,245 pg/mL  Results for orders placed or performed in visit on 08/30/23  Protein / creatinine ratio, urine  Result Value Ref Range   Creatinine, Urine 131   Basic metabolic panel with GFR  Result Value Ref Range   Glucose 124    BUN 14 4 - 21   CO2 29 (A) 13 - 22   Creatinine 0.8 0.5 - 1.1   Potassium 4.1 3.5 - 5.1 mEq/L   Sodium 143 137 - 147   Chloride 107 99 - 108  Comprehensive metabolic panel with GFR  Result Value Ref Range   eGFR 83    Albumin 4.0 3.5 - 5.0  CBC and differential  Result Value Ref Range   Hemoglobin 14.4 12.0 - 16.0   HCT 42 36 - 46   Neutrophils Absolute 4,026.00    Platelets 143 (A) 150 - 400 K/uL   WBC 6.6   CBC  Result Value Ref Range   RBC 4.57 3.87 - 5.11    Assessment & Plan     Annual wellness visit done today including the all of the following: Reviewed patient's Family Medical History Reviewed and updated list of patient's medical providers Assessment of cognitive impairment was done Assessed patient's functional ability Established a written schedule for health screening services Health Risk Assessent Completed and Reviewed Patient is not currently on any opioid medications.  Exercise Activities and Dietary recommendations  Goals      Exercise 150 minutes per week (moderate activity)        Immunization History  Administered Date(s) Administered   Influenza Split 01/17/2009, 01/22/2010, 03/31/2011, 01/14/2012   Influenza,inj,Quad PF,6+ Mos 04/13/2013, 05/13/2015   PFIZER(Purple Top)SARS-COV-2 Vaccination 12/13/2019, 01/03/2020   Td 07/03/2018   Tdap 12/07/2007    Health Maintenance  Topic Date Due   DEXA SCAN  Never done   Colonoscopy  10/11/2023   Zoster Vaccines- Shingrix  (1 of 2) 11/30/2023 (Originally 08/06/1974)   COVID-19 Vaccine (3 - Pfizer risk series) 01/25/2024 (Originally 01/31/2020)   Pneumonia Vaccine 81+ Years old (1 of 2 - PCV) 08/29/2024 (Originally 08/06/1974)   MAMMOGRAM  10/06/2023   OPHTHALMOLOGY EXAM  10/07/2023   INFLUENZA VACCINE  11/25/2023   Lung Cancer Screening  12/03/2023   HEMOGLOBIN A1C  03/01/2024   Diabetic kidney evaluation - eGFR measurement  08/29/2024   Diabetic kidney evaluation - Urine ACR  08/29/2024   FOOT EXAM  08/29/2024   Medicare Annual Wellness (AWV)  08/29/2024   DTaP/Tdap/Td (  3 - Td or Tdap) 07/02/2028   Hepatitis C Screening  Completed   HPV VACCINES  Aged Out   Meningococcal B Vaccine  Aged Out     Discussed health benefits of physical activity, and encouraged her to engage in regular exercise appropriate for her age and condition.    Medicare annual wellness visit, subsequent  Annual physical exam  Type 2 diabetes mellitus with diabetic neuropathy, without long-term current use of insulin (HCC) -     Hemoglobin A1c -     Comprehensive metabolic panel with GFR -     Microalbumin / creatinine urine ratio -     Gabapentin ; Take 1 capsule (100 mg total) by mouth at bedtime.  Dispense: 90 capsule; Refill: 0 -     Vitamin B12 -     metFORMIN  HCl ER; Take 1 tablet (750 mg total) by mouth 2 (two) times daily.  Dispense: 60 tablet; Refill: 0  Hypertension associated with diabetes (HCC)  Hyperlipidemia associated with type 2 diabetes mellitus (HCC) -     Lipid Panel With LDL/HDL Ratio -     Simvastatin ; Take 1 tablet (40 mg total) by mouth at bedtime.  Dispense: 90 tablet; Refill: 0  PAD (peripheral artery disease) (HCC)  Vitamin D  deficiency -     VITAMIN D  25 Hydroxy (Vit-D Deficiency, Fractures)  Hypothyroidism due to acquired atrophy of thyroid  -     TSH + free T4  High risk medication use -     Vitamin B12  Encounter for osteoporosis screening in asymptomatic postmenopausal patient -     DG  Bone Density; Future  Encounter for screening mammogram for breast cancer -     3D Screening Mammogram, Left and Right; Future    Medicare annual wellness visit, subsequent Due for DEXA scan and mammogram; orders sent today.  Annual physical exam Routine wellness visit. Physical exam overall unremarkable except as noted above. Routine lab work ordered as noted. Discussed health maintenance, vaccinations, and screenings. Recommended pneumococcal and herpes zoster vaccines. Explained herpes zoster risks. She declined COVID-19 vaccine. - Order mammogram. - Order bone density study. - Schedule annual wellness visit with nurse. - Recommend pneumococcal vaccine. - Recommend herpes zoster vaccine.  Type 2 diabetes mellitus with peripheral neuropathy Diabetes management well-controlled. Emphasized regular eye exams for diabetic retinopathy monitoring.  Neuropathy present. Sensation adequate but advised vigilance for potential decrease. - Perform foot exam. - Order eye exam. - Continue metformin , send prescription. - Continue gabapentin .  Hypothyroidism Hypothyroidism well-managed with levothyroxine . Plan to monitor thyroid  function with blood work. - Continue levothyroxine , send prescription after blood work.  Nicotine  dependence with current use Long-term tobacco use, one pack per day. Discussed quitting challenges and previous bupropion  use. Offered varenicline as alternative.  Low vitamin D  Low vitamin D  levels previously noted. Plan to recheck vitamin D  and B12 levels due to metformin  use. - Order vitamin D  level. - Order vitamin B12 level due to metformin  use.  Peripheral arterial disease Chronic, stable.  No acute concerns today.  Continue to monitor.  Continue to recommend smoking cessation to prevent worsening.    Return in about 6 months (around 03/01/2024) for Chronic f/u, DM.     I discussed the assessment and treatment plan with the patient  The patient was provided an  opportunity to ask questions and all were answered. The patient agreed with the plan and demonstrated an understanding of the instructions.   The patient was advised to call back or seek an  in-person evaluation if the symptoms worsen or if the condition fails to improve as anticipated.    Carlean Charter, DO  [facility] [phone] (phone) [phone] (fax)  [facility]

## 2023-08-30 NOTE — Patient Instructions (Addendum)
 Recommended vaccines: Shingrix (shingles)  - Schedule an appointment with your kidney doctor, Dr. Worthy Heads [facility]). - Schedule an appointment with the eye doctor for next month.

## 2023-08-30 NOTE — Telephone Encounter (Signed)
 Patient in office. Will get blood work after appointment

## 2023-08-30 NOTE — Telephone Encounter (Signed)
 Copied from CRM [id number]. Topic: Clinical - Request for Lab/Test Order >> Aug 30, 2023 10:49 AM Regina Pierce wrote: Reason for CRM: The patient is requesting lab orders before her appointment today. The patient's call back number is [phone].

## 2023-08-31 LAB — COMPREHENSIVE METABOLIC PANEL WITH GFR
ALT: 18 IU/L (ref 0–32)
AST: 17 IU/L (ref 0–40)
Albumin: 4.7 g/dL (ref 3.9–4.9)
Alkaline Phosphatase: 71 IU/L (ref 44–121)
BUN/Creatinine Ratio: 23 (ref 12–28)
BUN: 20 mg/dL (ref 8–27)
Bilirubin Total: 1 mg/dL (ref 0.0–1.2)
CO2: 26 mmol/L (ref 20–29)
Calcium: 9.7 mg/dL (ref 8.7–10.3)
Chloride: 99 mmol/L (ref 96–106)
Creatinine, Ser: 0.86 mg/dL (ref 0.57–1.00)
Globulin, Total: 2.3 g/dL (ref 1.5–4.5)
Glucose: 104 mg/dL — ABNORMAL HIGH (ref 70–99)
Potassium: 4 mmol/L (ref 3.5–5.2)
Sodium: 141 mmol/L (ref 134–144)
Total Protein: 7 g/dL (ref 6.0–8.5)
eGFR: 74 mL/min/{1.73_m2} (ref >59)

## 2023-08-31 LAB — HEMOGLOBIN A1C
Est. average glucose Bld gHb Est-mCnc: 137 mg/dL
Hgb A1c MFr Bld: 6.4 % — ABNORMAL HIGH (ref 4.8–5.6)

## 2023-08-31 LAB — MICROALBUMIN / CREATININE URINE RATIO
Creatinine, Urine: 108.1 mg/dL
Microalb/Creat Ratio: 423 mg/g{creat} — ABNORMAL HIGH (ref 0–29)
Microalbumin, Urine: 457.5 ug/mL

## 2023-08-31 LAB — TSH+FREE T4
Free T4: 1.77 ng/dL (ref 0.82–1.77)
TSH: 10.4 u[IU]/mL — ABNORMAL HIGH (ref 0.450–4.500)

## 2023-08-31 LAB — LIPID PANEL WITH LDL/HDL RATIO
Cholesterol, Total: 142 mg/dL (ref 100–199)
HDL: 36 mg/dL — ABNORMAL LOW (ref >39)
LDL Chol Calc (NIH): 71 mg/dL (ref 0–99)
LDL/HDL Ratio: 2 ratio (ref 0.0–3.2)
Triglycerides: 210 mg/dL — ABNORMAL HIGH (ref 0–149)
VLDL Cholesterol Cal: 35 mg/dL (ref 5–40)

## 2023-08-31 LAB — VITAMIN D 25 HYDROXY (VIT D DEFICIENCY, FRACTURES): Vit D, 25-Hydroxy: 40.1 ng/mL (ref 30.0–100.0)

## 2023-08-31 LAB — VITAMIN B12: Vitamin B-12: 448 pg/mL (ref 232–1245)

## 2023-09-05 ENCOUNTER — Other Ambulatory Visit: Payer: Self-pay | Admitting: Family Medicine

## 2023-09-05 DIAGNOSIS — I152 Hypertension secondary to endocrine disorders: Secondary | ICD-10-CM

## 2023-09-05 DIAGNOSIS — E1140 Type 2 diabetes mellitus with diabetic neuropathy, unspecified: Secondary | ICD-10-CM

## 2023-09-06 ENCOUNTER — Other Ambulatory Visit: Payer: Self-pay | Admitting: Family Medicine

## 2023-09-06 ENCOUNTER — Encounter: Payer: Self-pay | Admitting: Family Medicine

## 2023-09-06 DIAGNOSIS — E1140 Type 2 diabetes mellitus with diabetic neuropathy, unspecified: Secondary | ICD-10-CM

## 2023-09-11 ENCOUNTER — Ambulatory Visit: Payer: Self-pay | Admitting: Family Medicine

## 2023-09-11 DIAGNOSIS — E1140 Type 2 diabetes mellitus with diabetic neuropathy, unspecified: Secondary | ICD-10-CM

## 2023-09-11 DIAGNOSIS — E034 Atrophy of thyroid (acquired): Secondary | ICD-10-CM

## 2023-09-11 MED ORDER — LEVOTHYROXINE SODIUM 137 MCG PO TABS: 137.0000 ug | ORAL_TABLET | Freq: Every day | ORAL | 0 refills | Status: DC

## 2023-09-13 DIAGNOSIS — I10 Essential (primary) hypertension: Secondary | ICD-10-CM | POA: Diagnosis not present

## 2023-09-13 DIAGNOSIS — E1122 Type 2 diabetes mellitus with diabetic chronic kidney disease: Secondary | ICD-10-CM | POA: Diagnosis not present

## 2023-09-13 DIAGNOSIS — R809 Proteinuria, unspecified: Secondary | ICD-10-CM | POA: Diagnosis not present

## 2023-09-13 DIAGNOSIS — E785 Hyperlipidemia, unspecified: Secondary | ICD-10-CM | POA: Diagnosis not present

## 2023-09-15 ENCOUNTER — Other Ambulatory Visit: Payer: Self-pay | Admitting: Family Medicine

## 2023-09-15 DIAGNOSIS — E1140 Type 2 diabetes mellitus with diabetic neuropathy, unspecified: Secondary | ICD-10-CM

## 2023-09-16 ENCOUNTER — Other Ambulatory Visit: Payer: Self-pay | Admitting: Nephrology

## 2023-09-16 DIAGNOSIS — R809 Proteinuria, unspecified: Secondary | ICD-10-CM

## 2023-09-20 DIAGNOSIS — H524 Presbyopia: Secondary | ICD-10-CM | POA: Diagnosis not present

## 2023-09-20 DIAGNOSIS — H5203 Hypermetropia, bilateral: Secondary | ICD-10-CM | POA: Diagnosis not present

## 2023-09-20 DIAGNOSIS — E119 Type 2 diabetes mellitus without complications: Secondary | ICD-10-CM | POA: Diagnosis not present

## 2023-09-20 DIAGNOSIS — H52223 Regular astigmatism, bilateral: Secondary | ICD-10-CM | POA: Diagnosis not present

## 2023-09-20 LAB — HM DIABETES EYE EXAM

## 2023-09-20 NOTE — Telephone Encounter (Signed)
Please see the message below and advise.

## 2023-09-22 ENCOUNTER — Other Ambulatory Visit

## 2023-09-22 MED ORDER — GABAPENTIN 100 MG PO CAPS
100.0000 mg | ORAL_CAPSULE | Freq: Two times a day (BID) | ORAL | Status: DC
Start: 2023-09-22 — End: 2023-10-27

## 2023-09-22 NOTE — Telephone Encounter (Signed)
 Per mychart message nephrologist recommended stopping Aleve and increasing gabapentin  to BID.

## 2023-09-29 ENCOUNTER — Ambulatory Visit
Admission: RE | Admit: 2023-09-29 | Discharge: 2023-09-29 | Disposition: A | Discharge status: Home / Self Care | Source: Ambulatory Visit | Attending: Nephrology | Admitting: Nephrology

## 2023-09-29 DIAGNOSIS — R809 Proteinuria, unspecified: Secondary | ICD-10-CM | POA: Insufficient documentation

## 2023-10-03 ENCOUNTER — Encounter: Payer: Self-pay | Admitting: Family Medicine

## 2023-10-03 ENCOUNTER — Other Ambulatory Visit: Payer: Self-pay | Admitting: Family Medicine

## 2023-10-03 DIAGNOSIS — E1140 Type 2 diabetes mellitus with diabetic neuropathy, unspecified: Secondary | ICD-10-CM

## 2023-10-05 MED ORDER — METFORMIN HCL ER 750 MG PO TB24
750.0000 mg | ORAL_TABLET | Freq: Every day | ORAL | 3 refills | Status: DC
Start: 2023-10-05 — End: 2023-10-18

## 2023-10-18 MED ORDER — METFORMIN HCL ER 750 MG PO TB24: 750.0000 mg | ORAL_TABLET | Freq: Two times a day (BID) | ORAL | 3 refills | Status: AC

## 2023-10-24 ENCOUNTER — Other Ambulatory Visit: Payer: Self-pay | Admitting: Family Medicine

## 2023-10-24 ENCOUNTER — Telehealth: Payer: Self-pay

## 2023-10-24 DIAGNOSIS — E1140 Type 2 diabetes mellitus with diabetic neuropathy, unspecified: Secondary | ICD-10-CM

## 2023-10-24 DIAGNOSIS — E034 Atrophy of thyroid (acquired): Secondary | ICD-10-CM

## 2023-10-24 NOTE — Telephone Encounter (Signed)
 Spoke with Regina Pierce and discussed medication. Metformin  per daughter pt is taking 1 tab BID along with Gabapentin . Pt has been picking up medications an instruction Called pharmacy and confirmed last pick up from pt was 6/12 and for a 30 day supply taking 1 tab daily. Pharmacist will deactivate the 1 tab once daily as they do see the new order from Dr. Donzella that was sent 10/18/23. [name] has been advised her next refill pick will be 10/29/23. Daughter verbalized understanding.   Pharmacist also stated they sent a request for pt Gabapentin  for refil;l per daughter pt is also taken rx BID not once daily. Dr. Donzella will have to send in a new order with instructions for that per pharmacist as they have it once daily.

## 2023-10-24 NOTE — Telephone Encounter (Signed)
 Copied from CRM [id number]. Topic: Clinical - Prescription Issue >> Oct 24, 2023  1:33 PM DeAngela L wrote: Reason for CRM: metFORMIN  tablet needs a medication correction from the last prescription sent to pharm Patient takes this twice a day not 1 a day And also the gabapentin  capsule the patient is recommended to take 2 a day and this prescription needs to be rewritten for the pharmacy after it was changed by the kidney doctor  Pt daughter [phone] [facility] - [place], [place] - [address] [place] [address] Phone: [phone] Fax: [phone]

## 2023-10-24 NOTE — Telephone Encounter (Signed)
 LOV 08-30-23 NOV 03-02-24 LRF 08-30-23 Q:90 Per pharmacist script is once daily. Daughter stefanie states pt takes it BID.

## 2023-10-27 ENCOUNTER — Ambulatory Visit
Admission: RE | Admit: 2023-10-27 | Discharge: 2023-10-27 | Disposition: A | Discharge status: Home / Self Care | Source: Ambulatory Visit | Attending: Family Medicine | Admitting: Family Medicine

## 2023-10-27 DIAGNOSIS — Z780 Asymptomatic menopausal state: Secondary | ICD-10-CM | POA: Diagnosis not present

## 2023-10-27 DIAGNOSIS — Z1231 Encounter for screening mammogram for malignant neoplasm of breast: Secondary | ICD-10-CM | POA: Insufficient documentation

## 2023-10-27 DIAGNOSIS — Z13820 Encounter for screening for osteoporosis: Secondary | ICD-10-CM | POA: Insufficient documentation

## 2023-10-29 ENCOUNTER — Encounter: Payer: Self-pay | Admitting: Family Medicine

## 2023-10-30 MED ORDER — GABAPENTIN 100 MG PO CAPS: 100.0000 mg | ORAL_CAPSULE | Freq: Two times a day (BID) | ORAL | 0 refills | Status: DC

## 2023-10-30 NOTE — Addendum Note (Signed)
 Addended by: DONZELLA DOMINO on: 10/30/2023 02:06 PM   Modules accepted: Orders

## 2023-10-31 MED ORDER — GABAPENTIN 100 MG PO CAPS: 100.0000 mg | ORAL_CAPSULE | Freq: Two times a day (BID) | ORAL | 0 refills | Status: DC

## 2023-10-31 NOTE — Addendum Note (Signed)
 Addended by: DONZELLA DOMINO on: 10/31/2023 10:01 AM   Modules accepted: Orders

## 2023-11-21 ENCOUNTER — Encounter: Payer: Self-pay | Admitting: Acute Care

## 2023-12-02 ENCOUNTER — Encounter: Payer: Self-pay | Admitting: Family Medicine

## 2023-12-03 ENCOUNTER — Other Ambulatory Visit: Payer: Self-pay | Admitting: Family Medicine

## 2023-12-03 DIAGNOSIS — E1169 Type 2 diabetes mellitus with other specified complication: Secondary | ICD-10-CM

## 2023-12-03 DIAGNOSIS — E1159 Type 2 diabetes mellitus with other circulatory complications: Secondary | ICD-10-CM

## 2024-01-02 ENCOUNTER — Other Ambulatory Visit: Payer: Self-pay | Admitting: Family Medicine

## 2024-01-02 DIAGNOSIS — E034 Atrophy of thyroid (acquired): Secondary | ICD-10-CM

## 2024-01-27 ENCOUNTER — Telehealth: Payer: Self-pay

## 2024-01-27 NOTE — Telephone Encounter (Signed)
 Copied from CRM [id number]. Topic: Referral - Request for Referral >> Jan 27, 2024  8:29 AM Ahlexyia S wrote: Did the patient discuss referral with their provider in the last year? No (If No - schedule appointment) (If Yes - send message)  Appointment offered? Yes  Type of order/referral and detailed reason for visit: Pt stated her back doctor is requesting her PCP to send a referral in for a MRI.  Preference of office, provider, location:  [facility]; Dr. Larnell Dye and Dr Curtis Dye [address], [place], [place] [address] [facility] [phone]  If referral order, have you been seen by this specialty before? Yes, same issue (If Yes, this issue or another issue? When? Where?  Can we respond through MyChart? Yes

## 2024-01-28 ENCOUNTER — Emergency Department
Admission: EM | Admit: 2024-01-28 | Discharge: 2024-01-28 | Disposition: A | Discharge status: Home / Self Care | Attending: Emergency Medicine | Admitting: Emergency Medicine

## 2024-01-28 ENCOUNTER — Emergency Department

## 2024-01-28 ENCOUNTER — Other Ambulatory Visit: Payer: Self-pay

## 2024-01-28 DIAGNOSIS — Z7982 Long term (current) use of aspirin: Secondary | ICD-10-CM | POA: Insufficient documentation

## 2024-01-28 DIAGNOSIS — Z79899 Other long term (current) drug therapy: Secondary | ICD-10-CM | POA: Insufficient documentation

## 2024-01-28 DIAGNOSIS — M47816 Spondylosis without myelopathy or radiculopathy, lumbar region: Secondary | ICD-10-CM | POA: Diagnosis not present

## 2024-01-28 DIAGNOSIS — E119 Type 2 diabetes mellitus without complications: Secondary | ICD-10-CM | POA: Diagnosis not present

## 2024-01-28 DIAGNOSIS — M5416 Radiculopathy, lumbar region: Secondary | ICD-10-CM | POA: Diagnosis not present

## 2024-01-28 DIAGNOSIS — M545 Low back pain, unspecified: Secondary | ICD-10-CM | POA: Diagnosis not present

## 2024-01-28 DIAGNOSIS — N390 Urinary tract infection, site not specified: Secondary | ICD-10-CM | POA: Diagnosis not present

## 2024-01-28 DIAGNOSIS — M51362 Other intervertebral disc degeneration, lumbar region with discogenic back pain and lower extremity pain: Secondary | ICD-10-CM | POA: Diagnosis not present

## 2024-01-28 DIAGNOSIS — I10 Essential (primary) hypertension: Secondary | ICD-10-CM | POA: Diagnosis not present

## 2024-01-28 DIAGNOSIS — Z7984 Long term (current) use of oral hypoglycemic drugs: Secondary | ICD-10-CM | POA: Insufficient documentation

## 2024-01-28 LAB — URINALYSIS, COMPLETE (UACMP) WITH MICROSCOPIC
Bilirubin Urine: NEGATIVE
Glucose, UA: NEGATIVE mg/dL
Hgb urine dipstick: NEGATIVE
Ketones, ur: NEGATIVE mg/dL
Nitrite: NEGATIVE
Protein, ur: NEGATIVE mg/dL
Specific Gravity, Urine: 1.009 (ref 1.005–1.030)
pH: 5 (ref 5.0–8.0)

## 2024-01-28 LAB — CBC
HCT: 43.2 % (ref 36.0–46.0)
Hemoglobin: 14.9 g/dL (ref 12.0–15.0)
MCH: 31.2 pg (ref 26.0–34.0)
MCHC: 34.5 g/dL (ref 30.0–36.0)
MCV: 90.6 fL (ref 80.0–100.0)
Platelets: 147 K/uL — ABNORMAL LOW (ref 150–400)
RBC: 4.77 MIL/uL (ref 3.87–5.11)
RDW: 13 % (ref 11.5–15.5)
WBC: 8 K/uL (ref 4.0–10.5)
nRBC: 0 % (ref 0.0–0.2)

## 2024-01-28 LAB — BASIC METABOLIC PANEL WITH GFR
Anion gap: 11 (ref 5–15)
BUN: 23 mg/dL (ref 8–23)
CO2: 26 mmol/L (ref 22–32)
Calcium: 9.3 mg/dL (ref 8.9–10.3)
Chloride: 101 mmol/L (ref 98–111)
Creatinine, Ser: 0.95 mg/dL (ref 0.44–1.00)
GFR, Estimated: >60 mL/min (ref >60)
Glucose, Bld: 127 mg/dL — ABNORMAL HIGH (ref 70–99)
Potassium: 4 mmol/L (ref 3.5–5.1)
Sodium: 138 mmol/L (ref 135–145)

## 2024-01-28 MED ORDER — PREDNISONE 10 MG PO TABS: 10.0000 mg | ORAL_TABLET | Freq: Every day | ORAL | 0 refills | Status: DC

## 2024-01-28 MED ORDER — KETOROLAC TROMETHAMINE 15 MG/ML IJ SOLN
15.0000 mg | Freq: Once | INTRAMUSCULAR | Status: AC
  Administered 2024-01-28: 15 mg via INTRAVENOUS

## 2024-01-28 MED ORDER — METHOCARBAMOL 500 MG PO TABS: 500.0000 mg | ORAL_TABLET | Freq: Four times a day (QID) | ORAL | 0 refills | Status: DC

## 2024-01-28 MED ORDER — DEXAMETHASONE SODIUM PHOSPHATE 10 MG/ML IJ SOLN
10.0000 mg | Freq: Once | INTRAMUSCULAR | Status: AC
  Administered 2024-01-28: 10 mg via INTRAVENOUS

## 2024-01-28 MED ORDER — CEPHALEXIN 500 MG PO CAPS: 500.0000 mg | ORAL_CAPSULE | Freq: Four times a day (QID) | ORAL | 0 refills | Status: AC

## 2024-01-28 MED ORDER — KETOROLAC TROMETHAMINE 15 MG/ML IJ SOLN: 15.0000 mg | Freq: Once | INTRAMUSCULAR | Status: DC

## 2024-01-28 MED ORDER — DEXAMETHASONE SODIUM PHOSPHATE 10 MG/ML IJ SOLN
10.0000 mg | Freq: Once | INTRAMUSCULAR | Status: DC
  Filled 2024-01-28: qty 1

## 2024-01-28 MED ORDER — METHOCARBAMOL 500 MG PO TABS
500.0000 mg | ORAL_TABLET | Freq: Once | ORAL | Status: AC
  Administered 2024-01-28: 500 mg via ORAL
  Filled 2024-01-28: qty 1

## 2024-01-28 MED ORDER — KETOROLAC TROMETHAMINE 15 MG/ML IJ SOLN
15.0000 mg | Freq: Once | INTRAMUSCULAR | Status: DC
  Filled 2024-01-28: qty 1

## 2024-01-28 MED ORDER — HYDROCODONE-ACETAMINOPHEN 5-325 MG PO TABS
1.0000 | ORAL_TABLET | ORAL | Status: AC
  Administered 2024-01-28: 1 via ORAL
  Filled 2024-01-28: qty 1

## 2024-01-28 MED ORDER — OXYCODONE HCL 5 MG PO TABS: 5.0000 mg | ORAL_TABLET | Freq: Four times a day (QID) | ORAL | 0 refills | Status: DC | PRN

## 2024-01-28 NOTE — ED Provider Notes (Signed)
 [place] EMERGENCY DEPARTMENT AT [facility] Provider Note   CSN: [id number] Arrival date & time: 01/28/24  9177     Patient presents with: Back Pain   Regina Pierce is a 68 y.o. female with history of hypertension and diabetes presents to the emergency department for evaluation of several days of pain in her lower back with burning numbness and tingling, across the left lateral hip down the lower leg.  Symptoms have been present for several days with no trauma or injury.  She saw chiropractor and had x-rays taken, uncertain what the results were but patient states she has had no relief with over-the-counter Aleve.  Her pain is been moderate to severe.  She remains ambulatory.  No urinary symptoms or known fevers although she does have a slight  elevation in her temp of 99.1.  She denies any weakness or loss of bowel or bladder symptoms    Prior to Admission medications   Medication Sig Start Date End Date Taking? Authorizing Provider  cephALEXin (KEFLEX) 500 MG capsule Take 1 capsule (500 mg total) by mouth 4 (four) times daily for 5 days. 01/28/24 02/02/24 Yes Charlene Debby BROCKS, [name]  methocarbamol (ROBAXIN) 500 MG tablet Take 1 tablet (500 mg total) by mouth 4 (four) times daily. 01/28/24  Yes Charlene Debby BROCKS, [name]  oxyCODONE  (ROXICODONE ) 5 MG immediate release tablet Take 1 tablet (5 mg total) by mouth every 6 (six) hours as needed for severe pain (pain score 7-10). 01/28/24 01/27/25 Yes Charlene Debby BROCKS, [name]  predniSONE  (DELTASONE ) 10 MG tablet Take 1 tablet (10 mg total) by mouth daily. 6,5,4,3,2,1 six day taper 01/28/24  Yes Charlene Debby BROCKS, [name]  aspirin 81 MG tablet Take 81 mg by mouth every evening.     [provider]  levothyroxine  (SYNTHROID ) 137 MCG tablet TAKE ONE TABLET (137 MCG TOTAL) BY MOUTH DAILY BEFORE BREAKFAST. 01/02/24   Pardue, Lauraine SAILOR, DO  lisinopril -hydrochlorothiazide  (ZESTORETIC ) 10-12.5 MG tablet TAKE ONE TABLET BY MOUTH DAILY. 12/05/23   Donzella Lauraine SAILOR, DO  metFORMIN  (GLUCOPHAGE -XR) 750 MG 24 hr tablet Take 1 tablet (750 mg total) by mouth 2 (two) times daily with a meal. 10/18/23   Pardue, Lauraine SAILOR, DO  simvastatin  (ZOCOR ) 40 MG tablet TAKE ONE TABLET (40 MG TOTAL) BY MOUTH AT BEDTIME. 12/05/23   Pardue, Lauraine SAILOR, DO    Allergies: Patient has no known allergies.    Review of Systems  Updated Vital Signs BP (!) 205/94 (BP Location: Left Arm)   Pulse 81   Temp 99.1 F (37.3 C) (Oral)   Resp 20   Ht 5' 4 (1.626 m)   Wt 91.6 kg   LMP 04/27/2007   SpO2 97%   BMI 34.66 kg/m   Physical Exam Constitutional:      Appearance: She is well-developed.  HENT:     Head: Normocephalic and atraumatic.  Eyes:     Conjunctiva/sclera: Conjunctivae normal.  Cardiovascular:     Rate and Rhythm: Normal rate.  Pulmonary:     Effort: Pulmonary effort is normal. No respiratory distress.  Abdominal:     General: There is no distension.     Tenderness: There is no abdominal tenderness. There is no right CVA tenderness, left CVA tenderness or guarding.  Musculoskeletal:        General: Normal range of motion.     Cervical back: Normal range of motion.     Comments: Lumbar Spine: Examination of the lumbar spine reveals no bony  abnormality, no edema, and no ecchymosis.  There is no step off.  The patient has painful limited range of motion of the lumbar spine with flexion and extension.  The patient has normal lateral bend and rotation.  Pain with flexion the patient has a negative axial load test, and a negative rotational Waddell test.  The patient is non tender along the spinous process.  The patient is non tender along the paravertebral muscles, with no muscle spasms.  The patient is non tender along the iliac crest.  The patient is non tender in the sciatic notch.  The patient is non tender along the Sacroiliac joint.  There is no Coccyx joint tenderness.    Bilateral Lower Extremities: Examination of the lower extremities reveals no bony  abnormality, no edema, and no ecchymosis.  The patient has full active and passive range of motion of the hips, knees, and ankles.  There is no discomfort with range of motion exercises.  The patient is non tender along the greater trochanter region.  The patient has a negative Toula' test bilaterally.  There is normal skin warmth.  There is normal capillary refill bilaterally.    Neurologic: The patient has a slightly positive left straight leg raise.  The patient has normal muscle strength testing for the quadriceps, calves, ankle dorsiflexion, ankle plantarflexion, and extensor hallicus longus.  The patient has sensation that is intact to light touch.  Patellar tendon reflexes are normal.  No clonus noted.  Skin:    General: Skin is warm.     Findings: No rash.  Neurological:     General: No focal deficit present.     Mental Status: She is alert and oriented to person, place, and time. Mental status is at baseline.  Psychiatric:        Mood and Affect: Mood normal.        Behavior: Behavior normal.        Thought Content: Thought content normal.     (all labs ordered are listed, but only abnormal results are displayed) Labs Reviewed  URINALYSIS, COMPLETE (UACMP) WITH MICROSCOPIC - Abnormal; Notable for the following components:      Result Value   Color, Urine YELLOW (*)    APPearance HAZY (*)    Leukocytes,Ua SMALL (*)    Bacteria, UA MANY (*)    All other components within normal limits  CBC - Abnormal; Notable for the following components:   Platelets 147 (*)    All other components within normal limits  BASIC METABOLIC PANEL WITH GFR - Abnormal; Notable for the following components:   Glucose, Bld 127 (*)    All other components within normal limits    EKG: None  Radiology: DG Lumbar Spine 2-3 Views Result Date: 01/28/2024 CLINICAL DATA:  Low back pain. EXAM: LUMBAR SPINE - 2-3 VIEW COMPARISON:  None Available. FINDINGS: Two views study shows no evidence for lumbar  spine fracture. Loss of disc height with endplate degeneration noted L2-3 and L3-4 into a lesser degree at L4-5. Lower lumbar facet osteoarthritis evident. SI joints unremarkable. IMPRESSION: Degenerative changes in the lumbar spine without evidence for fracture. Electronically Signed   By: Camellia Candle M.D.   On: 01/28/2024 09:46     Procedures   Medications Ordered in the ED  HYDROcodone-acetaminophen  (NORCO/VICODIN) 5-325 MG per tablet 1 tablet (1 tablet Oral Given 01/28/24 0901)  methocarbamol (ROBAXIN) tablet 500 mg (500 mg Oral Given 01/28/24 0939)  ketorolac  (TORADOL ) 15 MG/ML injection 15 mg (15  mg Intravenous Given 01/28/24 0915)  dexamethasone  (DECADRON ) injection 10 mg (10 mg Intravenous Given 01/28/24 0914)  HYDROcodone-acetaminophen  (NORCO/VICODIN) 5-325 MG per tablet 1 tablet (1 tablet Oral Given 01/28/24 1109)                                    Medical Decision Making Amount and/or Complexity of Data Reviewed Labs: ordered. Radiology: ordered.  Risk Prescription drug management.   68 year old female with several days of left lumbar radiculopathy.  She does have burning numbness and tingling down the left leg but no weakness or neurological deficits.  Pain is been moderate to severe.  Upon arrival low-grade temp of 99.1.  No reports of fevers nausea vomiting.  Patient with normal CBC and BMP.  Urinalysis shows signs of a mild UTI, possible contamination.  X-rays negative for any acute bony abnormality, compression deformity.  She does have some degenerative changes present.  No abnormal bony lesions.  Patient was given dexamethasone , Toradol , hydrocodone and a muscle relaxer and saw significant improvement in pain.  She is given prescriptions for methocarbamol, oxycodone  and prednisone  taper for 6 days.  We will treat her left lumbar radiculopathy symptoms.  We will also place her on cephalexin for possible mild UTI.  She is given strict return precautions understands signs  symptoms return to the ER for.  Final diagnoses:  Left lumbar radiculopathy  Degeneration of intervertebral disc of lumbar region with discogenic back pain and lower extremity pain  Lower urinary tract infectious disease    ED Discharge Orders          Ordered    cephALEXin (KEFLEX) 500 MG capsule  4 times daily        01/28/24 1117    methocarbamol (ROBAXIN) 500 MG tablet  4 times daily        01/28/24 1117    oxyCODONE  (ROXICODONE ) 5 MG immediate release tablet  Every 6 hours PRN        01/28/24 1117    predniSONE  (DELTASONE ) 10 MG tablet  Daily        01/28/24 1117               Sharene Krikorian C, [name] 01/28/24 1120    Arlander Charleston, MD 01/28/24 1238

## 2024-01-28 NOTE — ED Triage Notes (Addendum)
 Pt to ED via POV for c/o back & leg pain x 5 days. States went to chiropractor on Wednesday; was told to see PCP and have an MRI. States pain radiates down left leg. Denies any injury. BP elevated in triage; pt states she takes BP medicine. Unsure if she took it this morning.

## 2024-01-28 NOTE — Discharge Instructions (Signed)
 Please take medications as prescribed.  Return to the ER if you develop any fevers increasing back pain, nausea vomiting.  Return to the ER for any worsening symptoms or any urgent changes in health.  Follow-up with primary care provider or orthopedics in 1 week for recheck

## 2024-02-09 DIAGNOSIS — M9963 Osseous and subluxation stenosis of intervertebral foramina of lumbar region: Secondary | ICD-10-CM | POA: Diagnosis not present

## 2024-02-09 DIAGNOSIS — E1140 Type 2 diabetes mellitus with diabetic neuropathy, unspecified: Secondary | ICD-10-CM | POA: Diagnosis not present

## 2024-03-01 DIAGNOSIS — R293 Abnormal posture: Secondary | ICD-10-CM | POA: Diagnosis not present

## 2024-03-01 DIAGNOSIS — M25552 Pain in left hip: Secondary | ICD-10-CM | POA: Diagnosis not present

## 2024-03-01 DIAGNOSIS — M545 Low back pain, unspecified: Secondary | ICD-10-CM | POA: Diagnosis not present

## 2024-03-02 ENCOUNTER — Ambulatory Visit: Admitting: Family Medicine

## 2024-03-02 ENCOUNTER — Other Ambulatory Visit: Payer: Self-pay

## 2024-03-02 ENCOUNTER — Encounter: Payer: Self-pay | Admitting: Family Medicine

## 2024-03-02 VITALS — BP 141/79 | HR 80 | Temp 98.9°F | Ht 64.0 in | Wt 200.3 lb

## 2024-03-02 DIAGNOSIS — E1140 Type 2 diabetes mellitus with diabetic neuropathy, unspecified: Secondary | ICD-10-CM

## 2024-03-02 DIAGNOSIS — I152 Hypertension secondary to endocrine disorders: Secondary | ICD-10-CM

## 2024-03-02 DIAGNOSIS — E785 Hyperlipidemia, unspecified: Secondary | ICD-10-CM

## 2024-03-02 DIAGNOSIS — F17200 Nicotine dependence, unspecified, uncomplicated: Secondary | ICD-10-CM | POA: Diagnosis not present

## 2024-03-02 DIAGNOSIS — Z7984 Long term (current) use of oral hypoglycemic drugs: Secondary | ICD-10-CM

## 2024-03-02 DIAGNOSIS — E034 Atrophy of thyroid (acquired): Secondary | ICD-10-CM

## 2024-03-02 DIAGNOSIS — E1159 Type 2 diabetes mellitus with other circulatory complications: Secondary | ICD-10-CM | POA: Diagnosis not present

## 2024-03-02 DIAGNOSIS — G3184 Mild cognitive impairment, so stated: Secondary | ICD-10-CM

## 2024-03-02 DIAGNOSIS — Z122 Encounter for screening for malignant neoplasm of respiratory organs: Secondary | ICD-10-CM

## 2024-03-02 DIAGNOSIS — F17210 Nicotine dependence, cigarettes, uncomplicated: Secondary | ICD-10-CM

## 2024-03-02 DIAGNOSIS — E1169 Type 2 diabetes mellitus with other specified complication: Secondary | ICD-10-CM

## 2024-03-02 DIAGNOSIS — Z87891 Personal history of nicotine dependence: Secondary | ICD-10-CM

## 2024-03-02 LAB — POCT GLYCOSYLATED HEMOGLOBIN (HGB A1C): Hemoglobin A1C: 6.4 % — AB (ref 4.0–5.6)

## 2024-03-02 MED ORDER — SIMVASTATIN 40 MG PO TABS: 40.0000 mg | ORAL_TABLET | Freq: Every evening | ORAL | 1 refills | Status: AC

## 2024-03-02 MED ORDER — LISINOPRIL-HYDROCHLOROTHIAZIDE 10-12.5 MG PO TABS: 1.0000 | ORAL_TABLET | Freq: Every day | ORAL | 1 refills | Status: AC

## 2024-03-02 NOTE — Progress Notes (Signed)
 Established patient visit   Patient: Regina Pierce   DOB: Jun 23, 1955   68 y.o. Female  MRN: [id number] Visit Date: 03/02/2024  Today's healthcare provider: LAURAINE LOISE BUOY, DO   Chief Complaint  Patient presents with   Medical Management of Chronic Issues    Patient is here for today a 6 month follow up regarding.  Reports she does not monitor her glucose at home.   Daughter wants to see if she could go off the Lyrica.  Vaccines declined   Subjective    HPI Regina Pierce is a 68 year old female who presents with questions about discontinuing Lyrica. She is accompanied by her daughter, Regina Pierce.  She has been experiencing neuropathy, primarily in her hips, characterized by tingling sensations. Previously, she had burning sensations in her feet, which resolved before starting Lyrica. Her fingertips began tingling a few months ago, but her feet were no longer burning at that time. She has been on Lyrica 75 mg twice daily since February 09, 2024, prescribed by an orthopedic surgeon for back pain, which has since resolved. She recently started physical therapy and prefers this approach over medication for managing her symptoms.  She has a history of smoking and continues to smoke. She has not been contacted for a lung cancer screening, which is typically due in August.  She does not monitor her blood pressure at home. She is currently on lisinopril -hydrochlorothiazide .  No fatigue, chest pain, shortness of breath, lightheadedness, dizziness, or swelling. She reports no current pain.      Medications: Outpatient Medications Prior to Visit  Medication Sig   aspirin 81 MG tablet Take 81 mg by mouth every evening.    metFORMIN  (GLUCOPHAGE -XR) 750 MG 24 hr tablet Take 1 tablet (750 mg total) by mouth 2 (two) times daily with a meal.   [DISCONTINUED] levothyroxine  (SYNTHROID ) 137 MCG tablet TAKE ONE TABLET (137 MCG TOTAL) BY MOUTH DAILY BEFORE BREAKFAST.   [DISCONTINUED]  lisinopril -hydrochlorothiazide  (ZESTORETIC ) 10-12.5 MG tablet TAKE ONE TABLET BY MOUTH DAILY.   [DISCONTINUED] pregabalin (LYRICA) 75 MG capsule Take 75 mg by mouth 2 (two) times daily.   [DISCONTINUED] simvastatin  (ZOCOR ) 40 MG tablet TAKE ONE TABLET (40 MG TOTAL) BY MOUTH AT BEDTIME.   [DISCONTINUED] methocarbamol (ROBAXIN) 500 MG tablet Take 1 tablet (500 mg total) by mouth 4 (four) times daily.   [DISCONTINUED] oxyCODONE  (ROXICODONE ) 5 MG immediate release tablet Take 1 tablet (5 mg total) by mouth every 6 (six) hours as needed for severe pain (pain score 7-10).   [DISCONTINUED] predniSONE  (DELTASONE ) 10 MG tablet Take 1 tablet (10 mg total) by mouth daily. 6,5,4,3,2,1 six day taper   No facility-administered medications prior to visit.        Objective    BP (!) 141/79 (BP Location: Right Arm, Patient Position: Sitting)   Pulse 80   Temp 98.9 F (37.2 C) (Oral)   Ht 5' 4 (1.626 m)   Wt 200 lb 4.8 oz (90.9 kg)   LMP 04/27/2007   SpO2 99%   BMI 34.38 kg/m     Physical Exam Constitutional:      Appearance: Normal appearance.  HENT:     Head: Normocephalic and atraumatic.  Eyes:     General: No scleral icterus.    Extraocular Movements: Extraocular movements intact.     Conjunctiva/sclera: Conjunctivae normal.  Cardiovascular:     Rate and Rhythm: Normal rate and regular rhythm.     Pulses: Normal pulses.  Heart sounds: Normal heart sounds.  Pulmonary:     Effort: Pulmonary effort is normal. No respiratory distress.     Breath sounds: Normal breath sounds.  Abdominal:     General: Bowel sounds are normal. There is no distension.     Palpations: Abdomen is soft. There is no mass.     Tenderness: There is no abdominal tenderness. There is no guarding.  Musculoskeletal:     Right lower leg: No edema.     Left lower leg: No edema.  Skin:    General: Skin is warm and dry.  Neurological:     Mental Status: She is alert and oriented to person, place, and time.  Mental status is at baseline.  Psychiatric:        Mood and Affect: Mood normal.        Behavior: Behavior normal.      Results for orders placed or performed in visit on 03/02/24  POCT HgB A1C  Result Value Ref Range   Hemoglobin A1C 6.4 (A) 4.0 - 5.6 %   HbA1c POC (<> result, manual entry)     HbA1c, POC (prediabetic range)     HbA1c, POC (controlled diabetic range)      Assessment & Plan    Type 2 diabetes mellitus with diabetic neuropathy, without long-term current use of insulin (HCC) -     POCT glycosylated hemoglobin (Hb A1C)  Nicotine  dependence with current use -     Ambulatory Referral for Lung Cancer Scre  Hypertension associated with diabetes (HCC) -     Lisinopril -hydroCHLOROthiazide ; Take 1 tablet by mouth daily.  Dispense: 90 tablet; Refill: 1  Hypothyroidism due to acquired atrophy of thyroid  -     TSH  Hyperlipidemia associated with type 2 diabetes mellitus (HCC) -     Simvastatin ; Take 1 tablet (40 mg total) by mouth at bedtime.  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 Tingling in fingertips noted. Lyrica initially prescribed for back pain, now resolved.  POC 6.4 today. - Discontinued Lyrica (originally for back pain) with taper: 75 mg once daily for a week, then stop. - Continue physical therapy for back pain management.  Nicotine  dependence with current use Continues smoking, not interested in quitting.  Discussed about smoking cessation could help improve neuropathy symptoms. - Resent referral for lung cancer screening.  Hypothyroidism due to acquired atrophy of thyroid  Thyroid  function requires assessment. Synthroid  prescription valid pending lab results. - Ordered thyroid  blood test. - Will adjust Synthroid  dosage based on lab results.  Hypertension associated with diabetes Chronic, blood pressure elevated. On  lisinopril -hydrochlorothiazide .  Discussed medication increase, which patient prefers to defer for the time being. - Counseled patient on lifestyle modifications and medication adherence.  Hyperlipidemia associated with type 2 diabetes mellitus Chronic, stable on simvastatin  40 mg daily.  No acute concerns.  Continue to monitor.  Mild cognitive impairment Noted.  Previously evaluated by neurology.  She and her daughter prefer to stop pregabalin to avoid worsening.  Discontinued as noted.  Continue to monitor.    Return in about 6 months (around 08/30/2024) for CPE, Chronic f/u and mAWV with AWV nurse.      I discussed the assessment and treatment plan with the patient  The patient was provided an opportunity to ask questions and all were answered. The patient agreed with the plan and demonstrated an understanding of the  instructions.   The patient was advised to call back or seek an in-person evaluation if the symptoms worsen or if the condition fails to improve as anticipated.    LAURAINE LOISE BUOY, DO  [facility] [phone] (phone) [phone] (fax)  [facility]

## 2024-03-10 ENCOUNTER — Ambulatory Visit: Payer: Self-pay | Admitting: Family Medicine

## 2024-03-10 DIAGNOSIS — E034 Atrophy of thyroid (acquired): Secondary | ICD-10-CM

## 2024-03-10 LAB — TSH: TSH: 1.24 u[IU]/mL (ref 0.450–4.500)

## 2024-03-10 MED ORDER — LEVOTHYROXINE SODIUM 137 MCG PO TABS
137.0000 ug | ORAL_TABLET | Freq: Every day | ORAL | 1 refills | Status: AC
Start: 2024-03-10 — End: ?

## 2024-03-13 ENCOUNTER — Ambulatory Visit
Admission: RE | Admit: 2024-03-13 | Discharge: 2024-03-13 | Disposition: A | Discharge status: Home / Self Care | Source: Ambulatory Visit | Attending: Acute Care | Admitting: Acute Care

## 2024-03-13 DIAGNOSIS — Z87891 Personal history of nicotine dependence: Secondary | ICD-10-CM | POA: Diagnosis not present

## 2024-03-13 DIAGNOSIS — Z122 Encounter for screening for malignant neoplasm of respiratory organs: Secondary | ICD-10-CM | POA: Diagnosis not present

## 2024-03-13 DIAGNOSIS — F17210 Nicotine dependence, cigarettes, uncomplicated: Secondary | ICD-10-CM | POA: Diagnosis not present

## 2024-03-19 ENCOUNTER — Other Ambulatory Visit: Payer: Self-pay

## 2024-03-19 DIAGNOSIS — Z87891 Personal history of nicotine dependence: Secondary | ICD-10-CM

## 2024-03-19 DIAGNOSIS — F17210 Nicotine dependence, cigarettes, uncomplicated: Secondary | ICD-10-CM

## 2024-03-19 DIAGNOSIS — Z122 Encounter for screening for malignant neoplasm of respiratory organs: Secondary | ICD-10-CM

## 2024-04-14 ENCOUNTER — Encounter: Payer: Self-pay | Admitting: Family Medicine

## 2024-09-04 ENCOUNTER — Encounter

## 2024-09-04 ENCOUNTER — Ambulatory Visit
